# Patient Record
Sex: Male | Born: 1945
Health system: Southern US, Community
[De-identification: ages and names within clinical notes are randomized; demographics above are authoritative.]

## PROBLEM LIST (undated history)

## (undated) DIAGNOSIS — I10 Essential (primary) hypertension: Secondary | ICD-10-CM

## (undated) DIAGNOSIS — I739 Peripheral vascular disease, unspecified: Secondary | ICD-10-CM

## (undated) DIAGNOSIS — J449 Chronic obstructive pulmonary disease, unspecified: Secondary | ICD-10-CM

## (undated) DIAGNOSIS — E119 Type 2 diabetes mellitus without complications: Secondary | ICD-10-CM

## (undated) HISTORY — PX: BELOW KNEE LEG AMPUTATION: SUR23

---

## 2016-12-03 DIAGNOSIS — I10 Essential (primary) hypertension: Secondary | ICD-10-CM | POA: Diagnosis not present

## 2016-12-03 DIAGNOSIS — R809 Proteinuria, unspecified: Secondary | ICD-10-CM | POA: Diagnosis not present

## 2016-12-03 DIAGNOSIS — E1129 Type 2 diabetes mellitus with other diabetic kidney complication: Secondary | ICD-10-CM | POA: Diagnosis not present

## 2016-12-03 DIAGNOSIS — M79641 Pain in right hand: Secondary | ICD-10-CM | POA: Diagnosis not present

## 2017-01-28 DIAGNOSIS — Z89512 Acquired absence of left leg below knee: Secondary | ICD-10-CM | POA: Diagnosis not present

## 2017-01-28 DIAGNOSIS — Z86010 Personal history of colonic polyps: Secondary | ICD-10-CM | POA: Diagnosis not present

## 2017-01-28 DIAGNOSIS — E119 Type 2 diabetes mellitus without complications: Secondary | ICD-10-CM | POA: Diagnosis not present

## 2017-01-28 DIAGNOSIS — Z1159 Encounter for screening for other viral diseases: Secondary | ICD-10-CM | POA: Diagnosis not present

## 2017-01-28 DIAGNOSIS — Z0000 Encounter for general adult medical examination without abnormal findings: Secondary | ICD-10-CM | POA: Diagnosis not present

## 2017-01-28 DIAGNOSIS — E1151 Type 2 diabetes mellitus with diabetic peripheral angiopathy without gangrene: Secondary | ICD-10-CM | POA: Diagnosis not present

## 2017-01-28 DIAGNOSIS — I10 Essential (primary) hypertension: Secondary | ICD-10-CM | POA: Diagnosis not present

## 2017-01-28 DIAGNOSIS — E785 Hyperlipidemia, unspecified: Secondary | ICD-10-CM | POA: Diagnosis not present

## 2017-05-01 DIAGNOSIS — I739 Peripheral vascular disease, unspecified: Secondary | ICD-10-CM | POA: Diagnosis not present

## 2017-05-01 DIAGNOSIS — E119 Type 2 diabetes mellitus without complications: Secondary | ICD-10-CM | POA: Diagnosis not present

## 2017-05-01 DIAGNOSIS — R945 Abnormal results of liver function studies: Secondary | ICD-10-CM | POA: Diagnosis not present

## 2017-07-30 DIAGNOSIS — J449 Chronic obstructive pulmonary disease, unspecified: Secondary | ICD-10-CM | POA: Diagnosis not present

## 2017-07-30 DIAGNOSIS — I10 Essential (primary) hypertension: Secondary | ICD-10-CM | POA: Diagnosis not present

## 2017-07-30 DIAGNOSIS — R809 Proteinuria, unspecified: Secondary | ICD-10-CM | POA: Diagnosis not present

## 2017-07-30 DIAGNOSIS — Z23 Encounter for immunization: Secondary | ICD-10-CM | POA: Diagnosis not present

## 2017-07-30 DIAGNOSIS — E1129 Type 2 diabetes mellitus with other diabetic kidney complication: Secondary | ICD-10-CM | POA: Diagnosis not present

## 2017-07-30 DIAGNOSIS — Z89512 Acquired absence of left leg below knee: Secondary | ICD-10-CM | POA: Diagnosis not present

## 2017-07-30 DIAGNOSIS — I739 Peripheral vascular disease, unspecified: Secondary | ICD-10-CM | POA: Diagnosis not present

## 2018-04-24 DIAGNOSIS — E1129 Type 2 diabetes mellitus with other diabetic kidney complication: Secondary | ICD-10-CM | POA: Diagnosis not present

## 2018-04-24 DIAGNOSIS — I739 Peripheral vascular disease, unspecified: Secondary | ICD-10-CM | POA: Diagnosis not present

## 2018-04-24 DIAGNOSIS — J449 Chronic obstructive pulmonary disease, unspecified: Secondary | ICD-10-CM | POA: Diagnosis not present

## 2018-04-24 DIAGNOSIS — I10 Essential (primary) hypertension: Secondary | ICD-10-CM | POA: Diagnosis not present

## 2018-04-24 DIAGNOSIS — Z89512 Acquired absence of left leg below knee: Secondary | ICD-10-CM | POA: Diagnosis not present

## 2018-04-24 DIAGNOSIS — E785 Hyperlipidemia, unspecified: Secondary | ICD-10-CM | POA: Diagnosis not present

## 2018-04-24 DIAGNOSIS — R710 Precipitous drop in hematocrit: Secondary | ICD-10-CM | POA: Diagnosis not present

## 2018-04-24 DIAGNOSIS — R809 Proteinuria, unspecified: Secondary | ICD-10-CM | POA: Diagnosis not present

## 2018-07-15 ENCOUNTER — Encounter (HOSPITAL_COMMUNITY): Payer: Self-pay | Admitting: Emergency Medicine

## 2018-07-15 ENCOUNTER — Other Ambulatory Visit: Payer: Self-pay

## 2018-07-15 ENCOUNTER — Inpatient Hospital Stay (HOSPITAL_COMMUNITY)
Admission: EM | Admit: 2018-07-15 | Discharge: 2018-07-24 | DRG: 252 | Disposition: A | Discharge status: Skilled Nursing Facility | Payer: Medicare Other | Attending: Internal Medicine | Admitting: Internal Medicine

## 2018-07-15 DIAGNOSIS — I70211 Atherosclerosis of native arteries of extremities with intermittent claudication, right leg: Secondary | ICD-10-CM | POA: Diagnosis present

## 2018-07-15 DIAGNOSIS — E1169 Type 2 diabetes mellitus with other specified complication: Secondary | ICD-10-CM

## 2018-07-15 DIAGNOSIS — R3129 Other microscopic hematuria: Secondary | ICD-10-CM | POA: Diagnosis present

## 2018-07-15 DIAGNOSIS — E875 Hyperkalemia: Secondary | ICD-10-CM | POA: Diagnosis present

## 2018-07-15 DIAGNOSIS — I110 Hypertensive heart disease with heart failure: Secondary | ICD-10-CM | POA: Diagnosis present

## 2018-07-15 DIAGNOSIS — E785 Hyperlipidemia, unspecified: Secondary | ICD-10-CM | POA: Diagnosis present

## 2018-07-15 DIAGNOSIS — G9341 Metabolic encephalopathy: Secondary | ICD-10-CM | POA: Diagnosis not present

## 2018-07-15 DIAGNOSIS — I5041 Acute combined systolic (congestive) and diastolic (congestive) heart failure: Secondary | ICD-10-CM

## 2018-07-15 DIAGNOSIS — I5043 Acute on chronic combined systolic (congestive) and diastolic (congestive) heart failure: Secondary | ICD-10-CM | POA: Diagnosis present

## 2018-07-15 DIAGNOSIS — Z23 Encounter for immunization: Secondary | ICD-10-CM | POA: Diagnosis not present

## 2018-07-15 DIAGNOSIS — G9340 Encephalopathy, unspecified: Secondary | ICD-10-CM | POA: Diagnosis present

## 2018-07-15 DIAGNOSIS — R64 Cachexia: Secondary | ICD-10-CM | POA: Diagnosis present

## 2018-07-15 DIAGNOSIS — I5042 Chronic combined systolic (congestive) and diastolic (congestive) heart failure: Secondary | ICD-10-CM

## 2018-07-15 DIAGNOSIS — Z66 Do not resuscitate: Secondary | ICD-10-CM | POA: Diagnosis present

## 2018-07-15 DIAGNOSIS — I10 Essential (primary) hypertension: Secondary | ICD-10-CM | POA: Diagnosis present

## 2018-07-15 DIAGNOSIS — R4182 Altered mental status, unspecified: Secondary | ICD-10-CM

## 2018-07-15 DIAGNOSIS — I272 Pulmonary hypertension, unspecified: Secondary | ICD-10-CM | POA: Diagnosis present

## 2018-07-15 DIAGNOSIS — I6529 Occlusion and stenosis of unspecified carotid artery: Secondary | ICD-10-CM | POA: Diagnosis present

## 2018-07-15 DIAGNOSIS — R531 Weakness: Secondary | ICD-10-CM

## 2018-07-15 DIAGNOSIS — L8990 Pressure ulcer of unspecified site, unspecified stage: Secondary | ICD-10-CM

## 2018-07-15 DIAGNOSIS — L89152 Pressure ulcer of sacral region, stage 2: Secondary | ICD-10-CM | POA: Diagnosis present

## 2018-07-15 DIAGNOSIS — I4819 Other persistent atrial fibrillation: Secondary | ICD-10-CM | POA: Diagnosis not present

## 2018-07-15 DIAGNOSIS — I739 Peripheral vascular disease, unspecified: Secondary | ICD-10-CM | POA: Diagnosis present

## 2018-07-15 DIAGNOSIS — Z89512 Acquired absence of left leg below knee: Secondary | ICD-10-CM

## 2018-07-15 DIAGNOSIS — I2510 Atherosclerotic heart disease of native coronary artery without angina pectoris: Secondary | ICD-10-CM | POA: Diagnosis present

## 2018-07-15 DIAGNOSIS — Z87891 Personal history of nicotine dependence: Secondary | ICD-10-CM

## 2018-07-15 DIAGNOSIS — I998 Other disorder of circulatory system: Secondary | ICD-10-CM | POA: Diagnosis present

## 2018-07-15 DIAGNOSIS — Z7984 Long term (current) use of oral hypoglycemic drugs: Secondary | ICD-10-CM

## 2018-07-15 DIAGNOSIS — E1151 Type 2 diabetes mellitus with diabetic peripheral angiopathy without gangrene: Principal | ICD-10-CM | POA: Diagnosis present

## 2018-07-15 DIAGNOSIS — Z6820 Body mass index (BMI) 20.0-20.9, adult: Secondary | ICD-10-CM

## 2018-07-15 DIAGNOSIS — I255 Ischemic cardiomyopathy: Secondary | ICD-10-CM | POA: Diagnosis present

## 2018-07-15 DIAGNOSIS — D649 Anemia, unspecified: Secondary | ICD-10-CM | POA: Diagnosis present

## 2018-07-15 DIAGNOSIS — I4891 Unspecified atrial fibrillation: Secondary | ICD-10-CM

## 2018-07-15 DIAGNOSIS — J449 Chronic obstructive pulmonary disease, unspecified: Secondary | ICD-10-CM | POA: Diagnosis present

## 2018-07-15 DIAGNOSIS — I071 Rheumatic tricuspid insufficiency: Secondary | ICD-10-CM | POA: Diagnosis present

## 2018-07-15 DIAGNOSIS — E440 Moderate protein-calorie malnutrition: Secondary | ICD-10-CM | POA: Diagnosis not present

## 2018-07-15 DIAGNOSIS — Z7951 Long term (current) use of inhaled steroids: Secondary | ICD-10-CM

## 2018-07-15 DIAGNOSIS — Z833 Family history of diabetes mellitus: Secondary | ICD-10-CM

## 2018-07-15 HISTORY — DX: Essential (primary) hypertension: I10

## 2018-07-15 HISTORY — DX: Type 2 diabetes mellitus without complications: E11.9

## 2018-07-15 HISTORY — DX: Chronic obstructive pulmonary disease, unspecified: J44.9

## 2018-07-15 HISTORY — DX: Peripheral vascular disease, unspecified: I73.9

## 2018-07-15 LAB — CBC WITH DIFFERENTIAL/PLATELET
ABS IMMATURE GRANULOCYTES: 0.03 10*3/uL (ref 0.00–0.07)
BASOS ABS: 0 10*3/uL (ref 0.0–0.1)
BASOS PCT: 0 %
Eosinophils Absolute: 0 10*3/uL (ref 0.0–0.5)
Eosinophils Relative: 0 %
HCT: 38.1 % — ABNORMAL LOW (ref 39.0–52.0)
HEMOGLOBIN: 11.3 g/dL — AB (ref 13.0–17.0)
IMMATURE GRANULOCYTES: 0 %
LYMPHS PCT: 6 %
Lymphs Abs: 0.4 10*3/uL — ABNORMAL LOW (ref 0.7–4.0)
MCH: 26.8 pg (ref 26.0–34.0)
MCHC: 29.7 g/dL — ABNORMAL LOW (ref 30.0–36.0)
MCV: 90.3 fL (ref 80.0–100.0)
Monocytes Absolute: 0.5 10*3/uL (ref 0.1–1.0)
Monocytes Relative: 8 %
NEUTROS ABS: 6 10*3/uL (ref 1.7–7.7)
NEUTROS PCT: 86 %
NRBC: 0 % (ref 0.0–0.2)
PLATELETS: 123 10*3/uL — AB (ref 150–400)
RBC: 4.22 MIL/uL (ref 4.22–5.81)
RDW: 17.3 % — ABNORMAL HIGH (ref 11.5–15.5)
WBC: 7 10*3/uL (ref 4.0–10.5)

## 2018-07-15 LAB — COMPREHENSIVE METABOLIC PANEL
ALBUMIN: 3.2 g/dL — AB (ref 3.5–5.0)
ALK PHOS: 93 U/L (ref 38–126)
ALT: 19 U/L (ref 0–44)
AST: 38 U/L (ref 15–41)
Anion gap: 11 (ref 5–15)
BUN: 30 mg/dL — ABNORMAL HIGH (ref 8–23)
CALCIUM: 8.7 mg/dL — AB (ref 8.9–10.3)
CHLORIDE: 100 mmol/L (ref 98–111)
CO2: 21 mmol/L — AB (ref 22–32)
CREATININE: 1.35 mg/dL — AB (ref 0.61–1.24)
GFR calc non Af Amer: 50 mL/min — ABNORMAL LOW (ref >60)
GFR, EST AFRICAN AMERICAN: 58 mL/min — AB (ref >60)
GLUCOSE: 297 mg/dL — AB (ref 70–99)
Potassium: 4.8 mmol/L (ref 3.5–5.1)
SODIUM: 132 mmol/L — AB (ref 135–145)
Total Bilirubin: 1.6 mg/dL — ABNORMAL HIGH (ref 0.3–1.2)
Total Protein: 6.7 g/dL (ref 6.5–8.1)

## 2018-07-15 NOTE — ED Triage Notes (Addendum)
Patient reports generalized weakness /fatigue onset this week , denies SOB , no pain or discomfort . Alert and oriented to place and person . Denies fever or chills . Hypertensive at triage.

## 2018-07-16 ENCOUNTER — Encounter (HOSPITAL_COMMUNITY): Payer: Self-pay | Admitting: Internal Medicine

## 2018-07-16 ENCOUNTER — Inpatient Hospital Stay (HOSPITAL_COMMUNITY): Payer: Medicare Other

## 2018-07-16 ENCOUNTER — Other Ambulatory Visit: Payer: Self-pay

## 2018-07-16 ENCOUNTER — Emergency Department (HOSPITAL_COMMUNITY): Payer: Medicare Other

## 2018-07-16 DIAGNOSIS — D649 Anemia, unspecified: Secondary | ICD-10-CM | POA: Diagnosis present

## 2018-07-16 DIAGNOSIS — R3129 Other microscopic hematuria: Secondary | ICD-10-CM | POA: Diagnosis present

## 2018-07-16 DIAGNOSIS — I10 Essential (primary) hypertension: Secondary | ICD-10-CM | POA: Diagnosis present

## 2018-07-16 DIAGNOSIS — I272 Pulmonary hypertension, unspecified: Secondary | ICD-10-CM | POA: Diagnosis present

## 2018-07-16 DIAGNOSIS — I4819 Other persistent atrial fibrillation: Secondary | ICD-10-CM | POA: Diagnosis present

## 2018-07-16 DIAGNOSIS — L89152 Pressure ulcer of sacral region, stage 2: Secondary | ICD-10-CM | POA: Diagnosis present

## 2018-07-16 DIAGNOSIS — L03115 Cellulitis of right lower limb: Secondary | ICD-10-CM | POA: Diagnosis not present

## 2018-07-16 DIAGNOSIS — I5020 Unspecified systolic (congestive) heart failure: Secondary | ICD-10-CM | POA: Diagnosis not present

## 2018-07-16 DIAGNOSIS — E1151 Type 2 diabetes mellitus with diabetic peripheral angiopathy without gangrene: Secondary | ICD-10-CM | POA: Diagnosis present

## 2018-07-16 DIAGNOSIS — I5040 Unspecified combined systolic (congestive) and diastolic (congestive) heart failure: Secondary | ICD-10-CM | POA: Diagnosis not present

## 2018-07-16 DIAGNOSIS — I340 Nonrheumatic mitral (valve) insufficiency: Secondary | ICD-10-CM | POA: Diagnosis not present

## 2018-07-16 DIAGNOSIS — E119 Type 2 diabetes mellitus without complications: Secondary | ICD-10-CM | POA: Diagnosis not present

## 2018-07-16 DIAGNOSIS — I4891 Unspecified atrial fibrillation: Secondary | ICD-10-CM | POA: Diagnosis not present

## 2018-07-16 DIAGNOSIS — Z7401 Bed confinement status: Secondary | ICD-10-CM | POA: Diagnosis not present

## 2018-07-16 DIAGNOSIS — J449 Chronic obstructive pulmonary disease, unspecified: Secondary | ICD-10-CM | POA: Diagnosis present

## 2018-07-16 DIAGNOSIS — I6529 Occlusion and stenosis of unspecified carotid artery: Secondary | ICD-10-CM | POA: Diagnosis present

## 2018-07-16 DIAGNOSIS — E785 Hyperlipidemia, unspecified: Secondary | ICD-10-CM | POA: Diagnosis present

## 2018-07-16 DIAGNOSIS — Z23 Encounter for immunization: Secondary | ICD-10-CM | POA: Diagnosis not present

## 2018-07-16 DIAGNOSIS — J42 Unspecified chronic bronchitis: Secondary | ICD-10-CM | POA: Diagnosis not present

## 2018-07-16 DIAGNOSIS — M2550 Pain in unspecified joint: Secondary | ICD-10-CM | POA: Diagnosis not present

## 2018-07-16 DIAGNOSIS — E1169 Type 2 diabetes mellitus with other specified complication: Secondary | ICD-10-CM

## 2018-07-16 DIAGNOSIS — I998 Other disorder of circulatory system: Secondary | ICD-10-CM | POA: Insufficient documentation

## 2018-07-16 DIAGNOSIS — E440 Moderate protein-calorie malnutrition: Secondary | ICD-10-CM | POA: Diagnosis present

## 2018-07-16 DIAGNOSIS — J418 Mixed simple and mucopurulent chronic bronchitis: Secondary | ICD-10-CM | POA: Diagnosis not present

## 2018-07-16 DIAGNOSIS — I5043 Acute on chronic combined systolic (congestive) and diastolic (congestive) heart failure: Secondary | ICD-10-CM | POA: Diagnosis present

## 2018-07-16 DIAGNOSIS — Z89512 Acquired absence of left leg below knee: Secondary | ICD-10-CM | POA: Diagnosis not present

## 2018-07-16 DIAGNOSIS — I071 Rheumatic tricuspid insufficiency: Secondary | ICD-10-CM | POA: Diagnosis present

## 2018-07-16 DIAGNOSIS — I361 Nonrheumatic tricuspid (valve) insufficiency: Secondary | ICD-10-CM | POA: Diagnosis not present

## 2018-07-16 DIAGNOSIS — I739 Peripheral vascular disease, unspecified: Secondary | ICD-10-CM | POA: Diagnosis not present

## 2018-07-16 DIAGNOSIS — G9340 Encephalopathy, unspecified: Secondary | ICD-10-CM | POA: Diagnosis not present

## 2018-07-16 DIAGNOSIS — I480 Paroxysmal atrial fibrillation: Secondary | ICD-10-CM | POA: Diagnosis not present

## 2018-07-16 DIAGNOSIS — R4182 Altered mental status, unspecified: Secondary | ICD-10-CM | POA: Diagnosis not present

## 2018-07-16 DIAGNOSIS — R531 Weakness: Secondary | ICD-10-CM | POA: Diagnosis not present

## 2018-07-16 DIAGNOSIS — I5041 Acute combined systolic (congestive) and diastolic (congestive) heart failure: Secondary | ICD-10-CM | POA: Diagnosis not present

## 2018-07-16 DIAGNOSIS — I255 Ischemic cardiomyopathy: Secondary | ICD-10-CM | POA: Diagnosis present

## 2018-07-16 DIAGNOSIS — R64 Cachexia: Secondary | ICD-10-CM | POA: Diagnosis present

## 2018-07-16 DIAGNOSIS — I2510 Atherosclerotic heart disease of native coronary artery without angina pectoris: Secondary | ICD-10-CM | POA: Diagnosis present

## 2018-07-16 DIAGNOSIS — E875 Hyperkalemia: Secondary | ICD-10-CM | POA: Diagnosis present

## 2018-07-16 DIAGNOSIS — Z66 Do not resuscitate: Secondary | ICD-10-CM | POA: Diagnosis present

## 2018-07-16 DIAGNOSIS — I70211 Atherosclerosis of native arteries of extremities with intermittent claudication, right leg: Secondary | ICD-10-CM | POA: Diagnosis present

## 2018-07-16 DIAGNOSIS — G9341 Metabolic encephalopathy: Secondary | ICD-10-CM | POA: Diagnosis present

## 2018-07-16 DIAGNOSIS — I110 Hypertensive heart disease with heart failure: Secondary | ICD-10-CM | POA: Diagnosis present

## 2018-07-16 DIAGNOSIS — Z6820 Body mass index (BMI) 20.0-20.9, adult: Secondary | ICD-10-CM | POA: Diagnosis not present

## 2018-07-16 LAB — RAPID URINE DRUG SCREEN, HOSP PERFORMED
Amphetamines: NOT DETECTED
Barbiturates: NOT DETECTED
Benzodiazepines: NOT DETECTED
COCAINE: NOT DETECTED
Opiates: NOT DETECTED
Tetrahydrocannabinol: NOT DETECTED

## 2018-07-16 LAB — URINALYSIS, ROUTINE W REFLEX MICROSCOPIC
Bilirubin Urine: NEGATIVE
Glucose, UA: >=500 mg/dL — AB
KETONES UR: 5 mg/dL — AB
Leukocytes, UA: NEGATIVE
NITRITE: NEGATIVE
PH: 5 (ref 5.0–8.0)
Protein, ur: >=300 mg/dL — AB
Specific Gravity, Urine: 1.023 (ref 1.005–1.030)

## 2018-07-16 LAB — GLUCOSE, CAPILLARY
GLUCOSE-CAPILLARY: 81 mg/dL (ref 70–99)
Glucose-Capillary: 135 mg/dL — ABNORMAL HIGH (ref 70–99)

## 2018-07-16 LAB — CBG MONITORING, ED
GLUCOSE-CAPILLARY: 234 mg/dL — AB (ref 70–99)
Glucose-Capillary: 230 mg/dL — ABNORMAL HIGH (ref 70–99)

## 2018-07-16 LAB — CK: CK TOTAL: 276 U/L (ref 49–397)

## 2018-07-16 LAB — ACETAMINOPHEN LEVEL: Acetaminophen (Tylenol), Serum: <10 ug/mL — ABNORMAL LOW (ref 10–30)

## 2018-07-16 LAB — HEMOGLOBIN A1C
HEMOGLOBIN A1C: 7.9 % — AB (ref 4.8–5.6)
MEAN PLASMA GLUCOSE: 180.03 mg/dL

## 2018-07-16 LAB — SALICYLATE LEVEL: Salicylate Lvl: <7 mg/dL (ref 2.8–30.0)

## 2018-07-16 LAB — SEDIMENTATION RATE: Sed Rate: 13 mm/hr (ref 0–16)

## 2018-07-16 LAB — HEPARIN LEVEL (UNFRACTIONATED): Heparin Unfractionated: 0.14 IU/mL — ABNORMAL LOW (ref 0.30–0.70)

## 2018-07-16 LAB — PREALBUMIN: PREALBUMIN: 9.2 mg/dL — AB (ref 18–38)

## 2018-07-16 LAB — C-REACTIVE PROTEIN: CRP: 10.6 mg/dL — AB (ref <1.0)

## 2018-07-16 MED ORDER — ONDANSETRON HCL 4 MG PO TABS: 4.0000 mg | ORAL_TABLET | Freq: Four times a day (QID) | ORAL | Status: DC | PRN

## 2018-07-16 MED ORDER — INSULIN ASPART 100 UNIT/ML ~~LOC~~ SOLN: 0.0000 [IU] | Freq: Every day | SUBCUTANEOUS | Status: DC

## 2018-07-16 MED ORDER — ACETAMINOPHEN 650 MG RE SUPP: 650.0000 mg | Freq: Four times a day (QID) | RECTAL | Status: DC | PRN

## 2018-07-16 MED ORDER — ACETAMINOPHEN 325 MG PO TABS: 650.0000 mg | ORAL_TABLET | Freq: Four times a day (QID) | ORAL | Status: DC | PRN

## 2018-07-16 MED ORDER — HEPARIN SODIUM (PORCINE) 5000 UNIT/ML IJ SOLN: 5000.0000 [IU] | Freq: Three times a day (TID) | INTRAMUSCULAR | Status: DC

## 2018-07-16 MED ORDER — ATORVASTATIN CALCIUM 40 MG PO TABS
40.0000 mg | ORAL_TABLET | Freq: Every day | ORAL | Status: DC
  Administered 2018-07-16 – 2018-07-24 (×8): 40 mg via ORAL
  Filled 2018-07-16 (×9): qty 1

## 2018-07-16 MED ORDER — RAMIPRIL 2.5 MG PO CAPS
2.5000 mg | ORAL_CAPSULE | Freq: Every day | ORAL | Status: DC
  Administered 2018-07-16 – 2018-07-19 (×4): 2.5 mg via ORAL
  Filled 2018-07-16 (×5): qty 1

## 2018-07-16 MED ORDER — DOCUSATE SODIUM 100 MG PO CAPS
100.0000 mg | ORAL_CAPSULE | Freq: Two times a day (BID) | ORAL | Status: DC
  Administered 2018-07-16 – 2018-07-21 (×6): 100 mg via ORAL
  Filled 2018-07-16 (×12): qty 1

## 2018-07-16 MED ORDER — SODIUM CHLORIDE 0.9% FLUSH
3.0000 mL | Freq: Two times a day (BID) | INTRAVENOUS | Status: DC
  Administered 2018-07-16 – 2018-07-23 (×4): 3 mL via INTRAVENOUS

## 2018-07-16 MED ORDER — HEPARIN (PORCINE) IN NACL 100-0.45 UNIT/ML-% IJ SOLN
1100.0000 [IU]/h | INTRAMUSCULAR | Status: DC
  Administered 2018-07-16: 1100 [IU]/h via INTRAVENOUS
  Administered 2018-07-16: 900 [IU]/h via INTRAVENOUS
  Administered 2018-07-18: 1100 [IU]/h via INTRAVENOUS
  Filled 2018-07-16 (×4): qty 250

## 2018-07-16 MED ORDER — MOMETASONE FURO-FORMOTEROL FUM 200-5 MCG/ACT IN AERO
2.0000 | INHALATION_SPRAY | Freq: Two times a day (BID) | RESPIRATORY_TRACT | Status: DC
  Administered 2018-07-16 – 2018-07-24 (×15): 2 via RESPIRATORY_TRACT
  Filled 2018-07-16 (×2): qty 8.8

## 2018-07-16 MED ORDER — ALBUTEROL SULFATE (2.5 MG/3ML) 0.083% IN NEBU: 2.5000 mg | INHALATION_SOLUTION | RESPIRATORY_TRACT | Status: DC | PRN

## 2018-07-16 MED ORDER — METOPROLOL TARTRATE 50 MG PO TABS
50.0000 mg | ORAL_TABLET | Freq: Two times a day (BID) | ORAL | Status: DC
  Administered 2018-07-16 – 2018-07-20 (×9): 50 mg via ORAL
  Filled 2018-07-16 (×12): qty 1

## 2018-07-16 MED ORDER — SODIUM CHLORIDE 0.9 % IV SOLN
INTRAVENOUS | Status: DC
  Administered 2018-07-16 (×2): via INTRAVENOUS

## 2018-07-16 MED ORDER — PNEUMOCOCCAL VAC POLYVALENT 25 MCG/0.5ML IJ INJ
0.5000 mL | INJECTION | INTRAMUSCULAR | Status: AC
  Administered 2018-07-17: 0.5 mL via INTRAMUSCULAR
  Filled 2018-07-16: qty 0.5

## 2018-07-16 MED ORDER — HEPARIN BOLUS VIA INFUSION
3500.0000 [IU] | Freq: Once | INTRAVENOUS | Status: AC
  Administered 2018-07-16: 3500 [IU] via INTRAVENOUS
  Filled 2018-07-16: qty 3500

## 2018-07-16 MED ORDER — INSULIN ASPART 100 UNIT/ML ~~LOC~~ SOLN: 0.0000 [IU] | Freq: Three times a day (TID) | SUBCUTANEOUS | Status: DC

## 2018-07-16 MED ORDER — MORPHINE SULFATE (PF) 2 MG/ML IV SOLN: 2.0000 mg | INTRAVENOUS | Status: DC | PRN

## 2018-07-16 MED ORDER — HEPARIN BOLUS VIA INFUSION
2000.0000 [IU] | Freq: Once | INTRAVENOUS | Status: AC
  Administered 2018-07-16: 2000 [IU] via INTRAVENOUS
  Filled 2018-07-16: qty 2000

## 2018-07-16 MED ORDER — INSULIN ASPART 100 UNIT/ML ~~LOC~~ SOLN
0.0000 [IU] | Freq: Three times a day (TID) | SUBCUTANEOUS | Status: DC
  Administered 2018-07-16 – 2018-07-17 (×2): 5 [IU] via SUBCUTANEOUS
  Administered 2018-07-18 – 2018-07-19 (×2): 2 [IU] via SUBCUTANEOUS
  Administered 2018-07-19: 5 [IU] via SUBCUTANEOUS
  Administered 2018-07-19 – 2018-07-20 (×2): 8 [IU] via SUBCUTANEOUS
  Administered 2018-07-20: 3 [IU] via SUBCUTANEOUS
  Administered 2018-07-21: 5 [IU] via SUBCUTANEOUS
  Administered 2018-07-21: 8 [IU] via SUBCUTANEOUS
  Administered 2018-07-22: 3 [IU] via SUBCUTANEOUS
  Administered 2018-07-22: 8 [IU] via SUBCUTANEOUS
  Administered 2018-07-22 – 2018-07-23 (×2): 3 [IU] via SUBCUTANEOUS
  Administered 2018-07-23 (×2): 5 [IU] via SUBCUTANEOUS
  Administered 2018-07-24 (×2): 3 [IU] via SUBCUTANEOUS

## 2018-07-16 MED ORDER — INFLUENZA VAC SPLIT HIGH-DOSE 0.5 ML IM SUSY
0.5000 mL | PREFILLED_SYRINGE | INTRAMUSCULAR | Status: AC
  Administered 2018-07-17: 0.5 mL via INTRAMUSCULAR
  Filled 2018-07-16: qty 0.5

## 2018-07-16 MED ORDER — INSULIN ASPART 100 UNIT/ML ~~LOC~~ SOLN
0.0000 [IU] | Freq: Every day | SUBCUTANEOUS | Status: DC
  Administered 2018-07-17: 2 [IU] via SUBCUTANEOUS
  Administered 2018-07-21: 3 [IU] via SUBCUTANEOUS

## 2018-07-16 MED ORDER — ONDANSETRON HCL 4 MG/2ML IJ SOLN: 4.0000 mg | Freq: Four times a day (QID) | INTRAMUSCULAR | Status: DC | PRN

## 2018-07-16 NOTE — Progress Notes (Signed)
Preliminary results by tech - ABI completed. Right sided ABI 0.42. Left - BKA.  Oda Cogan, BS, RDMS, RVT

## 2018-07-16 NOTE — H&P (Signed)
History and Physical    Derek Hinton WUX:[id number] DOB: 07/22/1946 DOA: 07/15/2018  PCP: Sheral Apley, MD Consultants:  None Patient coming from:  Home - lives alone; NOK: Son, [phone]; Daughter, [phone]  Chief Complaint: AMS  HPI: [name] is a 72 y.o. male with medical history significant of HTN, DM presenting with AMS.  "I got so I couldn't do."  He couldn't get up, was in the floor.  Patient's brother came by yesterday afternoon and found the patient in the floor.  Possibly 36-48 hours down.  He is unable to bear weight on his right foot.  He saw his PCP a month or more ago and was told that the blood flow to the foot was good.  "The swelling's what got me.  See, I had sugar and didn't know I had it."  His right foot has been bothering him for maybe a year or more, pain.  He goes dancing Saturday nights and is able to dance slow dances.  A usual day for him: rides to his brother's house, gets mad, goes home and lays around the house.  He eats "very few" meals that he makes.  Eats a lot of sandwiches, eggs on Sunday mornings.  No fevers.  He is using an inhaler.  Quit smoking 6 months ago.  L BKA 7 years ago, and was supposed to quit then.   ED Course:  Carryover, per Dr. Tamala Julian:  [name] is 72 year old male with pmh of HTN and DM type II; who presents with generalized weakness and altered mental status. Patient had been found on the floor of his home but denies having any falls. CT imaging of the brain showed periventricular white matter and corona radiata hypodensities favoring chronic ischemic changes and a small remote left thalamic lacunar infarct. Labs revealed WBC 7, hemoglobin 11.3, platelets 123, sodium 132, BUN 30, creatinine 1.35(baseline 0.9), glucose 297, anion gap 11. Urinalysis revealed hematuria. Accepted to a telemetry bed. Checking acetaminophen level, salicylate level, UDS. Placed on normal saline IV fluids at 100 mL/h.  Review of Systems: As per HPI;  otherwise review of systems reviewed and negative.   Ambulatory Status:  Ambulates without assistance when he wears his porosthetic  Past Medical History:  Diagnosis Date  . COPD (chronic obstructive pulmonary disease) (HCC)    not on home O2  . Diabetes mellitus without complication ([place])   . Hypertension   . PVD (peripheral vascular disease) ([place])    s/p L BKA    Past Surgical History:  Procedure Laterality Date  . BELOW KNEE LEG AMPUTATION      Social History   Socioeconomic History  . Marital status: Divorced    Spouse name: Not on file  . Number of children: Not on file  . Years of education: Not on file  . Highest education level: Not on file  Occupational History  . Occupation: retired  Scientific laboratory technician  . Financial resource strain: Not on file  . Food insecurity:    Worry: Not on file    Inability: Not on file  . Transportation needs:    Medical: Not on file    Non-medical: Not on file  Tobacco Use  . Smoking status: Former Smoker    Packs/day: 1.00    Years: 60.00    Pack years: 60.00    Last attempt to quit: 12/2017    Years since quitting: 0.6  . Smokeless tobacco: Never Used  Substance and Sexual Activity  . Alcohol use: Not  Currently  . Drug use: Never  . Sexual activity: Not on file  Lifestyle  . Physical activity:    Days per week: Not on file    Minutes per session: Not on file  . Stress: Not on file  Relationships  . Social connections:    Talks on phone: Not on file    Gets together: Not on file    Attends religious service: Not on file    Active member of club or organization: Not on file    Attends meetings of clubs or organizations: Not on file    Relationship status: Not on file  . Intimate partner violence:    Fear of current or ex partner: Not on file    Emotionally abused: Not on file    Physically abused: Not on file    Forced sexual activity: Not on file  Other Topics Concern  . Not on file  Social History Narrative  . Not  on file    Allergies  Allergen Reactions  . Penicillins Other (See Comments)    Pt unsure, states he "didn't feel good" Patient states it made him "feel Funny"     Family History  Problem Relation Age of Onset  . Diabetes Father     Prior to Admission medications   Medication Sig Start Date End Date Taking? Authorizing Provider  atorvastatin (LIPITOR) 40 MG tablet Take 20 mg by mouth daily. 01/29/17  Yes [provider]  budesonide-formoterol (SYMBICORT) 160-4.5 MCG/ACT inhaler Inhale 2 puffs into the lungs 2 (two) times daily. 06/12/18  Yes [provider]  glipiZIDE (GLUCOTROL XL) 5 MG 24 hr tablet Take 5 mg by mouth daily. 06/06/18  Yes [provider]  metFORMIN (GLUCOPHAGE) 1000 MG tablet Take 1,000 mg by mouth 2 (two) times daily. 12/18/17  Yes [provider]  metoprolol tartrate (LOPRESSOR) 50 MG tablet Take 50 mg by mouth 2 (two) times daily. 04/28/18  Yes [provider]  ramipril (ALTACE) 2.5 MG capsule Take 2.5 mg by mouth daily. 04/28/18  Yes [provider]  traMADol (ULTRAM) 50 MG tablet Take 50 mg by mouth every 6 (six) hours as needed for moderate pain.  04/28/18  Yes [provider]    Physical Exam: Vitals:   07/16/18 0900 07/16/18 0915 07/16/18 0930 07/16/18 0945  BP: 134/79 (!) 152/105 (!) 169/123 (!) 149/94  Pulse: 87 94 94   Resp: 16 15 16 17   Temp:      TempSrc:      SpO2: 100% 100% 100%      General:  Appears calm and comfortable and is NAD; he does appear chronically ill and cachectic Eyes:  PERRL, R exotropia, normal lids, iris ENT:  grossly normal hearing, lips & tongue, mmm; poor dentition Neck:  no LAD, masses or thyromegaly Cardiovascular:  Irregularly irregular rate/rhythm, no m/r/g. Respiratory:   CTA bilaterally with no wheezes/rales/rhonchi.  Normal respiratory effort. Abdomen:  soft, NT, ND, NABS Skin: mottling of the RLE with erythema of the toes Musculoskeletal: s/p L BKA; RLE  with mottling and toe erythema with decreased capillary refill and tenderness to superficial palpation of the toes without obvious ulceration.  There is marked foot edema and this may be causing artificially diminished pulses by palpation and by Doppler. Psychiatric: blunted mood and affect, speech fluent and appropriate, AOx3 Neurologic:  CN 2-12 grossly intact, moves all extremities in coordinated fashion    Radiological Exams on Admission: Ct Head Wo Contrast  Result Date: 07/16/2018  CLINICAL DATA:  Altered mental status.  Hypertension and diabetes. EXAM: CT HEAD WITHOUT CONTRAST TECHNIQUE: Contiguous axial images were obtained from the base of the skull through the vertex without intravenous contrast. COMPARISON:  None. FINDINGS: Brain: Small remote lacunar infarct of the left thalamus, image 19/3. Periventricular white matter and corona radiata hypodensities favor chronic ischemic microvascular white matter disease. Otherwise, the brainstem, cerebellum, cerebral peduncles, thalami, basal ganglia, basilar cisterns, and ventricular system appear within normal limits. No intracranial hemorrhage, mass lesion, or acute CVA. Vascular: Atherosclerotic calcification of the vertebral arteries (especially the left) with punctate atherosclerotic calcification in the basilar artery. There is atherosclerotic calcification of the cavernous carotid arteries bilaterally. Skull: Unremarkable Sinuses/Orbits: Unremarkable Other: No supplemental non-categorized findings. IMPRESSION: 1. No acute intracranial findings. 2. Periventricular white matter and corona radiata hypodensities favor chronic ischemic microvascular white matter disease. 3. Small remote left thalamic lacunar infarct. 4. Atherosclerosis. Electronically Signed   By: Van Clines M.D.   On: 07/16/2018 03:01    EKG: Independently reviewed.  Afib with rate 106; nonspecific ST changes with no evidence of acute ischemia   Labs on Admission: I have  personally reviewed the available labs and imaging studies at the time of the admission.  Pertinent labs:   CO2 21 Glucose 297 BUN 30/Creatinine 1.35/GFR 50 Albumin 3.2 CK 276 WBC 7.0 Hgb 11.3 Tylenol, ASA negative UA: >500 glu, large Hgb, 5 ketones, >300 protein, rare bacteria UDS negative  Assessment/Plan Principal Problem:   Acute encephalopathy Active Problems:   PVD (peripheral vascular disease) (HCC)   COPD (chronic obstructive pulmonary disease) (HCC)   Essential hypertension   Type 2 diabetes mellitus with other specified complication (HCC)   Unspecified atrial fibrillation (HCC)   Acute encephalopathy -Patient presenting after he was found in the floor at home, possibly for a prolonged period of time -Normal CK -Son reports ongoing intermittent confusion (patient was generally appropriate but then thought it was Monday, for example) -Son denies dementia, but there may be a component of underlying mild cognitive impairment at play Additionally, there were vascular concerns (see below) -Will admit for additional evaluation and treatment -Given his uncertain baseline capacity as well as current ischemic limb concerns, inpatient admission is reasonable at this time  PVD -Patient with pain and mottling of the foot concerning for ischemia -Dopplers intermittent during his stay in the ER both by nursing staff and on my exam -Concern for limb ischemia -ASAP vascular surgery consult requested; I spoke with Dr. Donnetta Hutching, whose assistance is appreciated -He is started on heparin drip for now (for afib and also this issue) -He does have marked pedal edema and so this concern for limb ischemia may be exacerbated by this issue -Will increase Lipitor from 20 to 40 mg daily for now -PT consult tomorrow  Afib -Patient with apparent new-onset afib - he denies h/o in the past -I do not appreciate prior EKGs in either our records or in Care Everywhere -He appears to be rate  controlled at this time on Lopressor -Will start heparin per pharmacy for now -Of note, he does have microscopic hematuria and this will need to be monitored (he likely needs outpatient urology consultation for this issue)  COPD -He was recently started on Symbicort for this issue -He is not on home O2 -He is no longer smoking -Will add prn albuterol although he denies current symptoms  HTN -Continue Lopressor and Altace  DM -Will check A1c; it was 7.6 in 7/19 -hold Glucophage, Glucotrol -Cover with  moderate-scale SSI   DVT prophylaxis:Heparin drip  Code Status:  DNR - confirmed with patient/family Family Communication: Son present throughout evaluation  Disposition Plan:  To be determined Consults called: Vascular surgery; CM, DM, nutrition, PT Admission status: Admit - It is my clinical opinion that admission to INPATIENT is reasonable and necessary because of the expectation that this patient will require hospital care that crosses at least 2 midnights to treat this condition based on the medical complexity of the problems presented.  Given the aforementioned information, the predictability of an adverse outcome is felt to be significant.    Karmen Bongo MD [facility]  If note is complete, please contact covering daytime or nighttime physician. [email or web address] Password [email or web address]  07/16/2018, 10:50 AM

## 2018-07-16 NOTE — Progress Notes (Signed)
Inpatient Diabetes Program Recommendations  AACE/ADA: New Consensus Statement on Inpatient Glycemic Control (2015)  Target Ranges:  Prepandial:   less than 140 mg/dL      Peak postprandial:   less than 180 mg/dL (1-2 hours)      Critically ill patients:  140 - 180 mg/dL   Lab Results  Component Value Date   GLUCAP 135 (H) 07/16/2018   HGBA1C 7.9 (H) 07/16/2018    Review of Glycemic Control  Diabetes history: DM2 Outpatient Diabetes medications: metformin 1000 mg bid, glipizide 5 mg QAM Current orders for Inpatient glycemic control: Novolog 0-15 units tidwc and hs  HgbA1C - 7.9%. Creat1.35 Needs tighter glycemic control for healing.  Inpatient Diabetes Program Recommendations:     Add Levemir 10 units QHS.  Will follow closely.  Thank you. Lorenda Peck, RD, LDN, CDE Inpatient Diabetes Coordinator [phone]

## 2018-07-16 NOTE — Progress Notes (Signed)
ANTICOAGULATION CONSULT NOTE - Follow Up Consult  Pharmacy Consult for Heparin Indication: atrial fibrillation and possible ischemic leg  Allergies  Allergen Reactions  . Penicillins Other (See Comments)    Pt unsure, states he "didn't feel good" Patient states it made him "feel Funny"     Patient Measurements: Height: 5\' 9"  (175.3 cm) Weight: 138 lb (62.6 kg) IBW/kg (Calculated) : 70.7 Heparin Dosing Weight: 62.6 kg  Vital Signs: Temp: 98.1 F (36.7 C) (10/09 2157) BP: 142/89 (10/09 2157) Pulse Rate: 86 (10/09 2157)  Labs: Recent Labs    07/15/18 2235 07/16/18 2023  HGB 11.3*  --   HCT 38.1*  --   PLT 123*  --   HEPARINUNFRC  --  0.14*  CREATININE 1.35*  --   CKTOTAL 276  --     Estimated Creatinine Clearance: 42.5 mL/min (A) (by C-G formula based on SCr of 1.35 mg/dL (H)).  Assessment:  43 yom presented to the ED with AMS. Found to be in new onset afib and possible with an ischemic leg. IV heparin begun. Baseline Hgb and platelets are slightly low. Of note, pt with microscopic hematuria which will need to be monitored. Pt is not on anticoagulation PTA.     Initial heparin level is subtherapeutic (0.14) on 900 units/hr.   No infusion problems reported.  No hematuria reported tonight.  Goal of Therapy:  Heparin level 0.3-0.7 units/ml Monitor platelets by anticoagulation protocol: Yes   Plan:   Heparin 2000 units IV x 1  Increase heparin drip to 1100 units/hr  Next heparin level and CBC in am.  Monitor platelets and hematuria and s/sx bleeding.  Arty Baumgartner, [place] Pager: [phone] 07/16/2018,10:09 PM

## 2018-07-16 NOTE — ED Notes (Signed)
Pt notified that urine is needed. Urinal placed at bedside.

## 2018-07-16 NOTE — Progress Notes (Signed)
Preliminary results by tech - Right lower arterial duplex completed. The CFA is patent with monophasic flow. SFA demonstrates a high grade stenosis, 70-99% in the proximal to mid segment. The popliteal and calf vessels are patent with monophasic flow. Right sided ABI -0.42. Oda Cogan, BS, RDMS, RVT

## 2018-07-16 NOTE — ED Provider Notes (Signed)
TIME SEEN: 12:51 AM  CHIEF COMPLAINT: Generalized weakness, confusion  HPI: Patient is a 72 year old male with history of hypertension, diabetes, left BKA who presents to the emergency department with generalized weakness and confusion.  Patient is a very poor historian.  His son reports that he has been on the floor for the past 24 hours.  Patient denies that he fell.  States he sat himself down on the floor but then could not get up.  He lives at home alone.  He denies fevers, chest pain, shortness of breath, vomiting, diarrhea, numbness, tingling or focal weakness.  He reports he has been nauseated.  His son reports that he has been referring to his son as his 74 year old brother.  He appears to be confused which is some that is new for him.  ROS: Level 5 caveat secondary to confusion  PAST MEDICAL HISTORY/PAST SURGICAL HISTORY:  Past Medical History:  Diagnosis Date  . Diabetes mellitus without complication ([place])   . Hypertension     MEDICATIONS:  Prior to Admission medications   Not on File    ALLERGIES:  No Known Allergies  SOCIAL HISTORY:  Social History   Tobacco Use  . Smoking status: Former Research scientist (life sciences)  . Smokeless tobacco: Never Used  Substance Use Topics  . Alcohol use: Not Currently    FAMILY HISTORY: No family history on file.  EXAM: BP (!) 162/104   Pulse (!) 103   Temp 98.7 F (37.1 C) (Oral)   Resp 20   SpO2 100%  CONSTITUTIONAL: Alert and oriented to person and place but not time and responds appropriately to questions intermittently but appears confused with some questioning.  Elderly, in no distress HEAD: Normocephalic, atraumatic EYES: Conjunctivae clear, pupils appear equal, EOMI ENT: normal nose; moist mucous membranes NECK: Supple, no meningismus, no nuchal rigidity, no LAD  CARD: Irregularly irregular and minimally tachycardic; S1 and S2 appreciated; no murmurs, no clicks, no rubs, no gallops RESP: Normal chest excursion without splinting or  tachypnea; breath sounds clear and equal bilaterally; no wheezes, no rhonchi, no rales, no hypoxia or respiratory distress, speaking full sentences ABD/GI: Normal bowel sounds; non-distended; soft, non-tender, no rebound, no guarding, no peritoneal signs, no hepatosplenomegaly BACK:  The back appears normal and is non-tender to palpation, there is no CVA tenderness EXT: Patient is status post left BKA.  His right foot is extremely swollen and red but not warm.  I am unable to palpate pulses but he has easily dopplerable biphasic DP and PT pulses of the right foot.  There are no open wounds noted. SKIN: Normal color for age and race; warm; no rash NEURO: Moves all extremities equally, strength 5/5 in all 4 extremities, reports normal sensation diffusely, cranial nerves II through XII intact, normal speech PSYCH: The patient's mood and manner are appropriate. Grooming and personal hygiene are appropriate.  MEDICAL DECISION MAKING: Patient here with generalized weakness and confusion.  Denies any infectious symptoms, chest pain or shortness of breath.  He is in A. fib at this time.  He denies history of the same.  It does not appear he is on anticoagulation.  He has no focal neurologic deficits today other than being disoriented to time.  His son reports that he has been calling his son the wrong name all day today which is abnormal for him.  Will check labs, urine, head CT, CK level.  He does not appear dehydrated on exam.  ED PROGRESS: Patient's labs, urine, head CT showed no acute  abnormality.  Given acute change in mental status, will admit.   4:00 AM Discussed patient's case with hospitalist, Dr. Tamala Julian.  I have recommended admission and patient (and family if present) agree with this plan. Admitting physician will place admission orders.   I reviewed all nursing notes, vitals, pertinent previous records, EKGs, lab and urine results, imaging (as available).       EKG  Interpretation  Date/Time:  Tuesday July 15 2018 22:22:36 EDT Ventricular Rate:  106 PR Interval:    QRS Duration: 92 QT Interval:  336 QTC Calculation: 446 R Axis:   65 Text Interpretation:  Atrial fibrillation with rapid ventricular response Anterior infarct , age undetermined Abnormal ECG No old tracing to compare Confirmed by Ward, Cyril Mourning [phone]) on 07/16/2018 12:51:12 AM         Ward, Delice Bison, DO 07/16/18 0400

## 2018-07-16 NOTE — ED Notes (Signed)
Pt given cup of water. Handling PO fluids well.

## 2018-07-16 NOTE — Plan of Care (Signed)
Received patient from Dr. Cyril Mourning Ward. Derek Hinton is 72 year old male with pmh of HTN and DM type II; who presents with generalized weakness and altered mental status.  Patient had been found on the floor of his home but denies having any falls.  CT imaging of the brain showed periventricular white matter and corona radiata hypodensities favoring chronic ischemic changes and a small remote left thalamic lacunar infarct.  Labs revealed WBC 7, hemoglobin 11.3, platelets 123, sodium 132, BUN 30, creatinine 1.35(baseline 0.9), glucose 297, anion gap 11.  Urinalysis revealed hematuria.  Accepted to a telemetry bed.  Checking acetaminophen level, salicylate level, UDS.  Placed on normal saline IV fluids at 100 mL/h.

## 2018-07-16 NOTE — Progress Notes (Signed)
ANTICOAGULATION CONSULT NOTE - Initial Consult  Pharmacy Consult for heparin Indication: atrial fibrillation and possible ischemic leg  Allergies  Allergen Reactions  . Penicillins Other (See Comments)    Pt unsure, states he "didn't feel good" Patient states it made him "feel Funny"     Patient Measurements: Height: 5\' 9"  (175.3 cm) Weight: 138 lb (62.6 kg) IBW/kg (Calculated) : 70.7 Heparin Dosing Weight: 62.6kg  Vital Signs: BP: 149/94 (10/09 0945) Pulse Rate: 94 (10/09 0930)  Labs: Recent Labs    07/15/18 2235  HGB 11.3*  HCT 38.1*  PLT 123*  CREATININE 1.35*  CKTOTAL 276    Estimated Creatinine Clearance: 42.5 mL/min (A) (by C-G formula based on SCr of 1.35 mg/dL (H)).   Medical History: Past Medical History:  Diagnosis Date  . COPD (chronic obstructive pulmonary disease) (HCC)    not on home O2  . Diabetes mellitus without complication ([place])   . Hypertension   . PVD (peripheral vascular disease) ([place])    s/p L BKA    Medications:  Infusions:  . sodium chloride 100 mL/hr at 07/16/18 0700  . heparin      Assessment: 43 yom presented to the ED with AMS. Found to be in new onset afib and possible with an ischemic leg. To start IV heparin. Baseline Hgb and platelets are slightly low. Of note, pt with microscopic hematuria which will need to be monitored. Pt is not on anticoagulation PTA.   Goal of Therapy:  Heparin level 0.3-0.7 units/ml Monitor platelets by anticoagulation protocol: Yes   Plan:  Heparin bolus 3500 units IV x 1 Heparin gtt 900 units/hr Check an 8 hr heparin level Daily heparin level and CBC F/u vascular workup Monitor plts and bleeding/hematuria  Christeena Krogh, Rande Lawman 07/16/2018,11:12 AM

## 2018-07-16 NOTE — Consult Note (Addendum)
Hospital Consult    Reason for Consult:  Possible ischemic leg-right Requesting Physician:  ED MRN #:  [id number]  History of Present Illness: This is a 72 y.o. male who is a poor historian, who was brought to the hospital last evening with weakness and confusion.  He states that he went to see Dr. Tilden Dome about 3-4 weeks ago (he is unsure of exact timing) and he was told that his foot had pretty good blood flow.  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  He has good days and bad days with his prosthesis.  He states that his diabetes made his foot turn black and he had to have it amputated.  He states that his right foot is a little sore.  He states that he gets a little cramping in his calf and has gotten worse over the past couple of weeks but says it could've been longer than that but he is not sure.  Pt confused about his son but realizes it is his son after saying it was his father.  He states he has not had any fevers at home but has had some chills.  He says sometimes his diabetes is good and sometimes bad.  He denies feeling like heartbeat or feeling palpitations.    He is on oral agents for his diabetes.  The pt is on a statin for cholesterol management.  I went through his medications with his son and he is not on any blood thinners.  He is on a beta blocker and ACEI for blood pressure management.   His urine reveals hematuria.  Urine drug screen is negative.  He quit smoking around Thanksgiving last year.   His father had hx of diabetes.  No FH of AAA in his family per his son.    Past Medical History:  Diagnosis Date  . COPD (chronic obstructive pulmonary disease) ([facility]CC)    not on home O2  . Diabetes mellitus without complication ([place])   . Hypertension   . PVD (peripheral vascular disease) ([place])    s/p L BKA    Past Surgical History:  Procedure Laterality Date  . BELOW KNEE LEG AMPUTATION      Allergies  Allergen Reactions  . Penicillins  Other (See Comments)    Pt unsure, states he "didn't feel good" Patient states it made him "feel Funny"     Prior to Admission medications   Medication Sig Start Date End Date Taking? Authorizing Provider  atorvastatin (LIPITOR) 40 MG tablet Take 20 mg by mouth daily. 01/29/17  Yes [provider]  budesonide-formoterol (SYMBICORT) 160-4.5 MCG/ACT inhaler Inhale 2 puffs into the lungs 2 (two) times daily. 06/12/18  Yes [provider]  glipiZIDE (GLUCOTROL XL) 5 MG 24 hr tablet Take 5 mg by mouth daily. 06/06/18  Yes [provider]  metFORMIN (GLUCOPHAGE) 1000 MG tablet Take 1,000 mg by mouth 2 (two) times daily. 12/18/17  Yes [provider]  metoprolol tartrate (LOPRESSOR) 50 MG tablet Take 50 mg by mouth 2 (two) times daily. 04/28/18  Yes [provider]  ramipril (ALTACE) 2.5 MG capsule Take 2.5 mg by mouth daily. 04/28/18  Yes [provider]  traMADol (ULTRAM) 50 MG tablet Take 50 mg by mouth every 6 (six) hours as needed for moderate pain.  04/28/18  Yes [provider]    Social History   Socioeconomic History  . Marital status: Divorced  Spouse name: Not on file  . Number of children: Not on file  . Years of education: Not on file  . Highest education level: Not on file  Occupational History  . Occupation: retired  Scientific laboratory technician  . Financial resource strain: Not on file  . Food insecurity:    Worry: Not on file    Inability: Not on file  . Transportation needs:    Medical: Not on file    Non-medical: Not on file  Tobacco Use  . Smoking status: Former Smoker    Packs/day: 1.00    Years: 60.00    Pack years: 60.00    Last attempt to quit: 12/2017    Years since quitting: 0.6  . Smokeless tobacco: Never Used  Substance and Sexual Activity  . Alcohol use: Not Currently  . Drug use: Never  . Sexual activity: Not on file  Lifestyle  . Physical activity:    Days per week: Not on file    Minutes per session:  Not on file  . Stress: Not on file  Relationships  . Social connections:    Talks on phone: Not on file    Gets together: Not on file    Attends religious service: Not on file    Active member of club or organization: Not on file    Attends meetings of clubs or organizations: Not on file    Relationship status: Not on file  . Intimate partner violence:    Fear of current or ex partner: Not on file    Emotionally abused: Not on file    Physically abused: Not on file    Forced sexual activity: Not on file  Other Topics Concern  . Not on file  Social History Narrative  . Not on file    Family History  Problem Relation Age of Onset  . Diabetes Father      ROS: [x]  Positive   [ ]  Negative   [ ]  All sytems reviewed and are negative  Cardiac: []  chest pain/pressure []  palpitations []  SOB lying flat []  DOE [x]  irregular heart beat  Vascular: [x]  pain in right leg while walking []  pain in legs at rest []  pain in legs at night []  non-healing ulcers []  hx of DVT []  swelling in legs  Pulmonary: []  productive cough []  asthma/wheezing []  home O2  Neurologic: [x]  weakness - generalized []  numbness in []  arms []  legs []  hx of CVA []  mini stroke [] difficulty speaking or slurred speech []  temporary loss of vision in one eye []  dizziness  Hematologic: []  hx of cancer []  bleeding problems []  problems with blood clotting easily  Endocrine:   [x]  diabetes []  thyroid disease  GI []  vomiting blood []  blood in stool  GU: []  CKD/renal failure []  HD--[]  M/W/F or []  T/T/S []  burning with urination [x]  blood in urine  Psychiatric: []  anxiety []  depression  Musculoskeletal: []  arthritis []  joint pain  Integumentary: [x]  rash right foot []  ulcers  Constitutional: []  fever [x]  chills   Physical Examination  Vitals:   07/16/18 0630 07/16/18 0700  BP: (!) 141/87 (!) 173/113  Pulse: 85 89  Resp: 14 15  Temp:    SpO2: 98% (!) 85%   There is no height or  weight on file to calculate BMI.  General:  WDWN in NAD Gait: Not observed HENT: WNL, normocephalic Pulmonary: normal non-labored breathing, without Rales, rhonchi,  wheezing Cardiac: irregular; without carotid bruits Abdomen:  soft, NT/ND, no masses Skin:  without rashes Vascular Exam/Pulses:  Right Left  Radial 2+ (normal) 2+ (normal)  Femoral 2+ (normal) 2+ (normal)  DP Brisk doppler signal BKA  PT Brisk doppler signal BKA  Peroneal Brisk doppler signal BKA   Extremities:      Musculoskeletal: no muscle wasting or atrophy  Neurologic: A&O X 3;  No focal weakness or paresthesias are detected; speech is fluent/normal Psychiatric:  The pt has Normal affect.   CBC    Component Value Date/Time   WBC 7.0 07/15/2018 2235   RBC 4.22 07/15/2018 2235   HGB 11.3 (L) 07/15/2018 2235   HCT 38.1 (L) 07/15/2018 2235   PLT 123 (L) 07/15/2018 2235   MCV 90.3 07/15/2018 2235   MCH 26.8 07/15/2018 2235   MCHC 29.7 (L) 07/15/2018 2235   RDW 17.3 (H) 07/15/2018 2235   LYMPHSABS 0.4 (L) 07/15/2018 2235   MONOABS 0.5 07/15/2018 2235   EOSABS 0.0 07/15/2018 2235   BASOSABS 0.0 07/15/2018 2235    BMET    Component Value Date/Time   NA 132 (L) 07/15/2018 2235   K 4.8 07/15/2018 2235   CL 100 07/15/2018 2235   CO2 21 (L) 07/15/2018 2235   GLUCOSE 297 (H) 07/15/2018 2235   BUN 30 (H) 07/15/2018 2235   CREATININE 1.35 (H) 07/15/2018 2235   CALCIUM 8.7 (L) 07/15/2018 2235   GFRNONAA 50 (L) 07/15/2018 2235   GFRAA 58 (L) 07/15/2018 2235    COAGS: none  Non-Invasive Vascular Imaging:   None  CT head 07/16/18: IMPRESSION: 1. No acute intracranial findings. 2. Periventricular white matter and corona radiata hypodensities favor chronic ischemic microvascular white matter disease. 3. Small remote left thalamic lacunar infarct. 4. Atherosclerosis.   Statin:  Yes.   Beta Blocker:  Yes.   Aspirin:  No. ACEI:  Yes.   ARB:  No. CCB use:  No Other antiplatelets/anticoagulants:   No.    ASSESSMENT/PLAN: This is a 72 y.o. male with hx of diabetes with left BKA about 7 years ago who presents to ER with new onset of confusion/weakness & found to be in afib   -right leg with brisk doppler signals at the AT/PT/peroneal.   He underwent a left BKA ~ 7 years ago for diabetic foot infection per pt.  -he is in a fib and most likely will need to be started on heparin, however, he did have hematuria on u/a.  His UDS was negative.   -Dr. Donnetta Hutching will be by to see the pt this morning.   Leontine Locket, PA-C [facility] [phone]   I have examined the patient, reviewed and agree with above.  Patient found down for an undetermined period of time and brought to the emergency department.  Remains somewhat confused although talkative.  He does have excoriation on the skin on the dorsum part of his foot.  This does not appear to be ischemic to me.  His foot is warm and he has normal motor and sensory function.  Question if this is related to primary skin condition or fungal infection.  The patient reports that he saw his medical doctor about this several months ago but on sure as to the accuracy of this.  He does have history of below-knee amputation on the left side but is walking with a prosthesis.  He has a significantly diminished right femoral pulse and noninvasive studies today show a monophasic waveform at the common femoral area suggesting proximal stenosis.  We will follow along.  May need eventual  imaging studies but do not feel that he has any acute limb threatening ischemia currently.  Of note, he does have ulcerative skin lesion on the anterior surface of his right thigh.  Would consider dermatology evaluation for this and for his foot.  Curt Jews, MD 07/16/2018 3:26 PM

## 2018-07-16 NOTE — ED Notes (Signed)
Patient transported to Vascular 

## 2018-07-16 NOTE — Progress Notes (Signed)
CSW acknowledges consult for accessing medications for discharge. For further assistance with this matter please consult RNCM. At this time there are no further CSW needs warranted. CSW will sign off. Please reconsult if new need arises.    Derek Hinton, MSW, [facility] Clinical Social Worker [phone]

## 2018-07-17 ENCOUNTER — Inpatient Hospital Stay (HOSPITAL_COMMUNITY): Payer: Medicare Other

## 2018-07-17 DIAGNOSIS — I361 Nonrheumatic tricuspid (valve) insufficiency: Secondary | ICD-10-CM

## 2018-07-17 DIAGNOSIS — I340 Nonrheumatic mitral (valve) insufficiency: Secondary | ICD-10-CM

## 2018-07-17 DIAGNOSIS — J42 Unspecified chronic bronchitis: Secondary | ICD-10-CM

## 2018-07-17 LAB — BASIC METABOLIC PANEL
ANION GAP: 7 (ref 5–15)
BUN: 31 mg/dL — ABNORMAL HIGH (ref 8–23)
CO2: 23 mmol/L (ref 22–32)
Calcium: 8 mg/dL — ABNORMAL LOW (ref 8.9–10.3)
Chloride: 105 mmol/L (ref 98–111)
Creatinine, Ser: 1.11 mg/dL (ref 0.61–1.24)
GLUCOSE: 69 mg/dL — AB (ref 70–99)
POTASSIUM: 4.4 mmol/L (ref 3.5–5.1)
Sodium: 135 mmol/L (ref 135–145)

## 2018-07-17 LAB — HEPARIN LEVEL (UNFRACTIONATED)
HEPARIN UNFRACTIONATED: 0.38 [IU]/mL (ref 0.30–0.70)
Heparin Unfractionated: 0.19 IU/mL — ABNORMAL LOW (ref 0.30–0.70)

## 2018-07-17 LAB — ECHOCARDIOGRAM COMPLETE
Height: 69 in
Weight: 2208 oz

## 2018-07-17 LAB — CBC
HCT: 35 % — ABNORMAL LOW (ref 39.0–52.0)
Hemoglobin: 10.7 g/dL — ABNORMAL LOW (ref 13.0–17.0)
MCH: 27.5 pg (ref 26.0–34.0)
MCHC: 30.6 g/dL (ref 30.0–36.0)
MCV: 90 fL (ref 80.0–100.0)
NRBC: 0 % (ref 0.0–0.2)
PLATELETS: 98 10*3/uL — AB (ref 150–400)
RBC: 3.89 MIL/uL — AB (ref 4.22–5.81)
RDW: 17 % — ABNORMAL HIGH (ref 11.5–15.5)
WBC: 4.5 10*3/uL (ref 4.0–10.5)

## 2018-07-17 LAB — GLUCOSE, CAPILLARY
GLUCOSE-CAPILLARY: 94 mg/dL (ref 70–99)
Glucose-Capillary: 106 mg/dL — ABNORMAL HIGH (ref 70–99)
Glucose-Capillary: 140 mg/dL — ABNORMAL HIGH (ref 70–99)
Glucose-Capillary: 201 mg/dL — ABNORMAL HIGH (ref 70–99)
Glucose-Capillary: 238 mg/dL — ABNORMAL HIGH (ref 70–99)

## 2018-07-17 LAB — HIV ANTIBODY (ROUTINE TESTING W REFLEX): HIV SCREEN 4TH GENERATION: NONREACTIVE

## 2018-07-17 MED ORDER — GLUCERNA SHAKE PO LIQD
237.0000 mL | Freq: Two times a day (BID) | ORAL | Status: DC
  Administered 2018-07-17 – 2018-07-22 (×7): 237 mL via ORAL

## 2018-07-17 MED ORDER — ADULT MULTIVITAMIN W/MINERALS CH
1.0000 | ORAL_TABLET | Freq: Every day | ORAL | Status: DC
  Administered 2018-07-17 – 2018-07-24 (×7): 1 via ORAL
  Filled 2018-07-17 (×8): qty 1

## 2018-07-17 NOTE — H&P (View-Only) (Signed)
Patient ID: Derek Hinton, male   DOB: [birth year], 72 y.o.   MRN: [id number] Patient is comfortable today.  His daughter is here and helps provide information as well.  Reports that the patient has been active and actually dances every Saturday night with his BK prosthesis.  He is alert and oriented today.  He does report swelling causing discomfort in his right leg.  No clear-cut rest pain.  He does have this excoriated rash over his foot but does not appear to be profound ischemia.  He has markedly diminished flow to his right foot but I think this is chronic.  His noninvasive studies yesterday suggested iliac occlusive disease in addition to superficial femoral artery occlusive disease.  Had long discussion with the patient and the daughter.  Recommended arteriography for further evaluation to determine if endovascular treatment is possible for proximal stenosis.  Certainly would defer any aggressive major surgical revascularization.

## 2018-07-17 NOTE — Progress Notes (Signed)
Echocardiogram 2D Echocardiogram has been performed.  Derek Hinton 07/17/2018, 3:21 PM

## 2018-07-17 NOTE — Progress Notes (Signed)
Patient ID: Derek Hinton, male   DOB: October 23, 1945, 72 y.o.   MRN: [id number] Patient is comfortable today.  His daughter is here and helps provide information as well.  Reports that the patient has been active and actually dances every Saturday night with his BK prosthesis.  He is alert and oriented today.  He does report swelling causing discomfort in his right leg.  No clear-cut rest pain.  He does have this excoriated rash over his foot but does not appear to be profound ischemia.  He has markedly diminished flow to his right foot but I think this is chronic.  His noninvasive studies yesterday suggested iliac occlusive disease in addition to superficial femoral artery occlusive disease.  Had long discussion with the patient and the daughter.  Recommended arteriography for further evaluation to determine if endovascular treatment is possible for proximal stenosis.  Certainly would defer any aggressive major surgical revascularization.

## 2018-07-17 NOTE — Progress Notes (Signed)
ANTICOAGULATION CONSULT NOTE - Follow Up Consult  Pharmacy Consult for Heparin  Indication: atrial fibrillation and possible ischemic leg  Allergies  Allergen Reactions  . Penicillins Other (See Comments)    Pt unsure, states he "didn't feel good" Patient states it made him "feel Funny"     Patient Measurements: Height: 5\' 9"  (175.3 cm) Weight: 138 lb (62.6 kg) IBW/kg (Calculated) : 70.7 Heparin Dosing Weight: 62.6 kg  Vital Signs: Temp: 98.1 F (36.7 C) (10/09 2157) BP: 142/89 (10/09 2157) Pulse Rate: 86 (10/09 2157)  Labs: Recent Labs    07/15/18 2235 07/16/18 2023 07/17/18 0335  HGB 11.3*  --   --   HCT 38.1*  --   --   PLT 123*  --   --   HEPARINUNFRC  --  0.14* 0.19*  CREATININE 1.35*  --   --   CKTOTAL 276  --   --     Estimated Creatinine Clearance: 42.5 mL/min (A) (by C-G formula based on SCr of 1.35 mg/dL (H)).  Assessment:  58 yom presented to the ED with AMS. Found to be in new onset afib and possible with an ischemic leg. IV heparin begun. Baseline Hgb and platelets are slightly low. Of note, pt with microscopic hematuria which will need to be monitored. Pt is not on anticoagulation PTA.   10/10 AM update: heparin level is low at 0.19, however, heparin was off for about 30 minutes this AM just prior to lab draw due to loss of access. Will not change rate for now  Goal of Therapy:  Heparin level 0.3-0.7 units/ml Monitor platelets by anticoagulation protocol: Yes   Plan:   No rate change due to loss of access this AM before lab draw  Cont heparin at 1100 units/hr  Re-check heparin level at [place], PharmD, [place] Pharmacist Phone: [phone]

## 2018-07-17 NOTE — Evaluation (Signed)
Physical Therapy Evaluation Patient Details Name: Derek Hinton MRN: [id number] DOB: 1946-04-20 Today's Date: 07/17/2018   History of Present Illness  Pt is a 72 y/o male admitted secondary to being found down at home for an unknown time and with acute encephalopathy. Pt also found to be in a-fib. PMH including but not limited to L transtibial amputation, PVD, HTN, DM and COPD.    Clinical Impression  Pt presented supine in bed with HOB elevated, awake and willing to participate in therapy session. Pt's daughter present throughout. Prior to admission, pt reported that he was independent with all functional mobility and ADLs (with his L LE prosthesis). Pt lives alone and enjoys dancing. He currently requires min A-min A x2 for overall functional mobility with use of RW. He is limited secondary to R foot pain and weakness. Pt would continue to benefit from skilled physical therapy services at this time while admitted and after d/c to address the below listed limitations in order to improve overall safety and independence with functional mobility.     Follow Up Recommendations SNF;Supervision/Assistance - 24 hour    Equipment Recommendations  None recommended by PT    Recommendations for Other Services       Precautions / Restrictions Precautions Precautions: Fall Precaution Comments: prior L transtibial amputation with prosthesis in room Restrictions Weight Bearing Restrictions: No      Mobility  Bed Mobility Overal bed mobility: Needs Assistance Bed Mobility: Supine to Sit     Supine to sit: Min assist     General bed mobility comments: increased time and effort, pt insisting therapist help with trunk elevation  Transfers Overall transfer level: Needs assistance Equipment used: Rolling walker (2 wheeled) Transfers: Sit to/from [facility] Sit to Stand: Min assist;+2 safety/equipment;+2 physical assistance Stand pivot transfers: Min assist;+2  safety/equipment       General transfer comment: increased time and effort, bed in elevated position, assist for stability with transitional movements  Ambulation/Gait             General Gait Details: deferred secondary to increased pain in R foot  Stairs            Wheelchair Mobility    Modified Rankin (Stroke Patients Only)       Balance Overall balance assessment: Needs assistance Sitting-balance support: No upper extremity supported Sitting balance-Leahy Scale: Good     Standing balance support: Bilateral upper extremity supported Standing balance-Leahy Scale: Poor                               Pertinent Vitals/Pain Pain Assessment: Faces Faces Pain Scale: Hurts little more Pain Location: R foot Pain Descriptors / Indicators: Sore Pain Intervention(s): Monitored during session;Repositioned    Home Living Family/patient expects to be discharged to:: Private residence Living Arrangements: Alone Available Help at Discharge: Family;Available PRN/intermittently Type of Home: House Home Access: Stairs to enter Entrance Stairs-Rails: None Entrance Stairs-Number of Steps: 1 Home Layout: One level Home Equipment: Walker - 2 wheels;Shower seat      Prior Function Level of Independence: Independent with assistive device(s)         Comments: ambulates without an AD with L LE prosthesis     Hand Dominance        Extremity/Trunk Assessment   Upper Extremity Assessment Upper Extremity Assessment: Overall WFL for tasks assessed    Lower Extremity Assessment Lower Extremity Assessment: Generalized weakness  Communication   Communication: No difficulties  Cognition Arousal/Alertness: Awake/alert Behavior During Therapy: WFL for tasks assessed/performed Overall Cognitive Status: Within Functional Limits for tasks assessed                                        General Comments      Exercises      Assessment/Plan    PT Assessment Patient needs continued PT services  PT Problem List Decreased activity tolerance;Decreased balance;Decreased mobility;Decreased coordination;Decreased safety awareness;Decreased knowledge of precautions       PT Treatment Interventions Gait training;DME instruction;Stair training;Functional mobility training;Therapeutic activities;Balance training;Therapeutic exercise;Neuromuscular re-education;Patient/family education    PT Goals (Current goals can be found in the Care Plan section)  Acute Rehab PT Goals Patient Stated Goal: decrease pain PT Goal Formulation: With patient Time For Goal Achievement: 07/31/18 Potential to Achieve Goals: Good    Frequency Min 3X/week   Barriers to discharge        Co-evaluation               AM-PAC PT "6 Clicks" Daily Activity  Outcome Measure Difficulty turning over in bed (including adjusting bedclothes, sheets and blankets)?: A Little Difficulty moving from lying on back to sitting on the side of the bed? : Unable Difficulty sitting down on and standing up from a chair with arms (e.g., wheelchair, bedside commode, etc,.)?: Unable Help needed moving to and from a bed to chair (including a wheelchair)?: A Little Help needed walking in hospital room?: A Little Help needed climbing 3-5 steps with a railing? : Total 6 Click Score: 12    End of Session Equipment Utilized During Treatment: Gait belt Activity Tolerance: Patient limited by pain Patient left: in chair;with call bell/phone within reach;with family/visitor present Nurse Communication: Mobility status PT Visit Diagnosis: Other abnormalities of gait and mobility (R26.89)    Time: 4825-0037 PT Time Calculation (min) (ACUTE ONLY): 21 min   Charges:   PT Evaluation $PT Eval Moderate Complexity: [place], PT, DPT  Acute Rehabilitation Services Pager [phone] Office [place] 07/17/2018, 11:56 AM

## 2018-07-17 NOTE — Progress Notes (Signed)
ANTICOAGULATION CONSULT NOTE - Follow Up Consult  Pharmacy Consult for Heparin  Indication: atrial fibrillation and possible ischemic leg  Allergies  Allergen Reactions  . Penicillins Other (See Comments)    Pt unsure, states he "didn't feel good" Patient states it made him "feel Funny"     Patient Measurements: Height: 5\' 9"  (175.3 cm) Weight: 138 lb (62.6 kg) IBW/kg (Calculated) : 70.7 Heparin Dosing Weight: 62.6 kg  Vital Signs: Temp: 98.2 F (36.8 C) (10/10 1307) Temp Source: Oral (10/10 1307) BP: 128/89 (10/10 1307) Pulse Rate: 60 (10/10 1307)  Labs: Recent Labs    07/15/18 2235 07/16/18 2023 07/17/18 0335 07/17/18 1136  HGB 11.3*  --  10.7*  --   HCT 38.1*  --  35.0*  --   PLT 123*  --  98*  --   HEPARINUNFRC  --  0.14* 0.19* 0.38  CREATININE 1.35*  --  1.11  --   CKTOTAL 276  --   --   --     Estimated Creatinine Clearance: 51.7 mL/min (by C-G formula based on SCr of 1.11 mg/dL).  Assessment:  67 yom presented to the ED with AMS. Found to be in new onset afib and possible with an ischemic leg. IV heparin begun. Baseline Hgb and platelets are slightly low. Of note, pt with microscopic hematuria which will need to be monitored. Pt is not on anticoagulation PTA.   Heparin level now therapeutic  Goal of Therapy:  Heparin level 0.3-0.7 units/ml Monitor platelets by anticoagulation protocol: Yes   Plan:  Continue heparin at 1100 units / hr Follow up AM labs  Thank you Anette Guarneri, PharmD [phone]

## 2018-07-17 NOTE — Progress Notes (Signed)
PROGRESS NOTE        PATIENT DETAILS Name: Derek Hinton Age: 72 y.o. Sex: male Date of Birth: [birth year] Admit Date: 07/15/2018 Admitting Physician Karmen Bongo, MD EKC:[id number], Jenny Reichmann, MD  Brief Narrative: Patient is a 72 y.o. male with prior history of left BKA, DM-2, hypertension presented with confusion-he was apparently found down on the floor (was on the floor for almost 2 days)-he was also found to have new onset atrial fibrillation, and worsening leg ischemia.  He was subsequently admitted for further evaluation and treatment.  Subjective: Completely awake and alert.  No chest pain or shortness of breath.  Does acknowledge claudication pain since July.  Assessment/Plan: Acute metabolic encephalopathy: Suspect secondary dehydration from being on the floor for almost 2 days.  Has resolved.    New onset atrial fibrillation: Rate controlled-continue IV heparin.  Rate controlled with metoprolol.  Continue telemetry monitoring.  Awaiting echocardiogram.  DM-2: CBG stable continue with SSI  Hypertension: BP controlled-continue metoprolol and ramipril.  Peripheral vascular disease-with probable critical limb ischemia: No evidence of acute ischemic laying at this time per vascular surgery-continue with IV heparin. Continue statin-not on aspirin as patient is on anticoagulation. Await further input from vascular surgery.  COPD: Stable-continue with bronchodilators.  Left BKA  Other issues: Lives alone-is status post left BKA-uses a prosthesis-not very mobile.  Needs PT/OT evaluation.  Awaiting further input from vascular surgery as well.  Telemetry (independently reviewed): Atrial fibrillation.  DVT Prophylaxis: Full dose anticoagulation with Heparin  Code Status: Full code   Family Communication: Daughter at bedside  Disposition Plan: Remain inpatient-but will plan on Home health vs SNF on discharge  Antimicrobial agents: Anti-infectives  (From admission, onward)   None     Procedures: None  CONSULTS:  vascular surgery  Time spent: 25 minutes-Greater than 50% of this time was spent in counseling, explanation of diagnosis, planning of further management, and coordination of care.  MEDICATIONS: Scheduled Meds: . atorvastatin  40 mg Oral Daily  . docusate sodium  100 mg Oral BID  . Influenza vac split quadrivalent PF  0.5 mL Intramuscular Tomorrow-1000  . insulin aspart  0-15 Units Subcutaneous TID WC  . insulin aspart  0-5 Units Subcutaneous QHS  . metoprolol tartrate  50 mg Oral BID  . mometasone-formoterol  2 puff Inhalation BID  . pneumococcal 23 valent vaccine  0.5 mL Intramuscular Tomorrow-1000  . ramipril  2.5 mg Oral Daily  . sodium chloride flush  3 mL Intravenous Q12H   Continuous Infusions: . sodium chloride 100 mL/hr at 07/16/18 1931  . heparin 1,100 Units/hr (07/16/18 2239)   PRN Meds:.acetaminophen **OR** acetaminophen, albuterol, morphine injection, ondansetron **OR** ondansetron (ZOFRAN) IV   PHYSICAL EXAM: Vital signs: Vitals:   07/16/18 1927 07/16/18 2157 07/17/18 0535 07/17/18 0816  BP:  (!) 142/89 136/72   Pulse:  86 77   Resp:  18 18   Temp:  98.1 F (36.7 C) 97.8 F (36.6 C)   TempSrc:      SpO2: 97% 99% 98% 98%  Weight:      Height:       Filed Weights   07/16/18 1100  Weight: 62.6 kg   Body mass index is 20.38 kg/m.   General appearance :Awake, alert, not in any distress. Speech Clear. Not toxic Looking Eyes:, pupils equally reactive to light and accomodation,no scleral icterus.Pink conjunctiva  HEENT: Atraumatic and Normocephalic Neck: supple, no JVD. No cervical lymphadenopathy. No thyromegaly Resp:Good air entry bilaterally, no added sounds  CVS: S1 S2 irregular GI: Bowel sounds present, Non tender and not distended with no gaurding, rigidity or rebound.No organomegaly Extremities: Left BKA, right foot appears warm-appears to have edema in the right lower extremity.  Appears to have some erythema and mottling in the dorsum of his right foot. Neurology:  speech clear,Non focal, sensation is grossly intact. Musculoskeletal:No digital cyanosis Skin:No Rash, warm and dry Wounds:N/A  I have personally reviewed following labs and imaging studies  LABORATORY DATA: CBC: Recent Labs  Lab 07/15/18 2235 07/17/18 0335  WBC 7.0 4.5  NEUTROABS 6.0  --   HGB 11.3* 10.7*  HCT 38.1* 35.0*  MCV 90.3 90.0  PLT 123* 98*    Basic Metabolic Panel: Recent Labs  Lab 07/15/18 2235 07/17/18 0335  NA 132* 135  K 4.8 4.4  CL 100 105  CO2 21* 23  GLUCOSE 297* 69*  BUN 30* 31*  CREATININE 1.35* 1.11  CALCIUM 8.7* 8.0*    GFR: Estimated Creatinine Clearance: 51.7 mL/min (by C-G formula based on SCr of 1.11 mg/dL).  Liver Function Tests: Recent Labs  Lab 07/15/18 2235  AST 38  ALT 19  ALKPHOS 93  BILITOT 1.6*  PROT 6.7  ALBUMIN 3.2*   No results for input(s): LIPASE, AMYLASE in the last 168 hours. No results for input(s): AMMONIA in the last 168 hours.  Coagulation Profile: No results for input(s): INR, PROTIME in the last 168 hours.  Cardiac Enzymes: Recent Labs  Lab 07/15/18 2235  CKTOTAL 276    BNP (last 3 results) No results for input(s): PROBNP in the last 8760 hours.  HbA1C: Recent Labs    07/16/18 0855  HGBA1C 7.9*    CBG: Recent Labs  Lab 07/16/18 1156 07/16/18 1751 07/16/18 2154 07/17/18 0708 07/17/18 0812  GLUCAP 230* 135* 81 106* 140*    Lipid Profile: No results for input(s): CHOL, HDL, LDLCALC, TRIG, CHOLHDL, LDLDIRECT in the last 72 hours.  Thyroid Function Tests: No results for input(s): TSH, T4TOTAL, FREET4, T3FREE, THYROIDAB in the last 72 hours.  Anemia Panel: No results for input(s): VITAMINB12, FOLATE, FERRITIN, TIBC, IRON, RETICCTPCT in the last 72 hours.  Urine analysis:    Component Value Date/Time   COLORURINE AMBER (A) 07/16/2018 0113   APPEARANCEUR HAZY (A) 07/16/2018 0113   LABSPEC  1.023 07/16/2018 0113   PHURINE 5.0 07/16/2018 0113   GLUCOSEU >=500 (A) 07/16/2018 0113   HGBUR LARGE (A) 07/16/2018 0113   BILIRUBINUR NEGATIVE 07/16/2018 0113   KETONESUR 5 (A) 07/16/2018 0113   PROTEINUR >=300 (A) 07/16/2018 0113   NITRITE NEGATIVE 07/16/2018 0113   LEUKOCYTESUR NEGATIVE 07/16/2018 0113    Sepsis Labs: Lactic Acid, Venous No results found for: LATICACIDVEN  MICROBIOLOGY: No results found for this or any previous visit (from the past 240 hour(s)).  RADIOLOGY STUDIES/RESULTS: Ct Head Wo Contrast  Result Date: 07/16/2018 CLINICAL DATA:  Altered mental status.  Hypertension and diabetes. EXAM: CT HEAD WITHOUT CONTRAST TECHNIQUE: Contiguous axial images were obtained from the base of the skull through the vertex without intravenous contrast. COMPARISON:  None. FINDINGS: Brain: Small remote lacunar infarct of the left thalamus, image 19/3. Periventricular white matter and corona radiata hypodensities favor chronic ischemic microvascular white matter disease. Otherwise, the brainstem, cerebellum, cerebral peduncles, thalami, basal ganglia, basilar cisterns, and ventricular system appear within normal limits. No intracranial hemorrhage, mass lesion, or acute CVA. Vascular: Atherosclerotic  calcification of the vertebral arteries (especially the left) with punctate atherosclerotic calcification in the basilar artery. There is atherosclerotic calcification of the cavernous carotid arteries bilaterally. Skull: Unremarkable Sinuses/Orbits: Unremarkable Other: No supplemental non-categorized findings. IMPRESSION: 1. No acute intracranial findings. 2. Periventricular white matter and corona radiata hypodensities favor chronic ischemic microvascular white matter disease. 3. Small remote left thalamic lacunar infarct. 4. Atherosclerosis. Electronically Signed   By: Van Clines M.D.   On: 07/16/2018 03:01     LOS: 1 day   Oren Binet, MD  [facility]  If 7PM-7AM,  please contact night-coverage  Please page via [email or web address]-click on MD name and type text message  07/17/2018, 11:49 AM

## 2018-07-17 NOTE — Progress Notes (Signed)
Initial Nutrition Assessment  DOCUMENTATION CODES:   Non-severe (moderate) malnutrition in context of chronic illness  INTERVENTION:   - Glucerna Shake po BID, each supplement provides 220 kcal and 10 grams of protein  - Magic cup BID with meals, each supplement provides 290 kcal and 9 grams of protein  - MVI with minerals daily  - Provided education regarding the importance of adequate kcal and protein in wound healing  NUTRITION DIAGNOSIS:   Moderate Malnutrition related to chronic illness (COPD) as evidenced by mild fat depletion, moderate fat depletion, mild muscle depletion, moderate muscle depletion.  GOAL:   Patient will meet greater than or equal to 90% of their needs  MONITOR:   PO intake, Supplement acceptance, Labs, Skin, Weight trends  REASON FOR ASSESSMENT:   Consult Wound healing  ASSESSMENT:   72 year old male who presented to the ED on 10/8 with generalized weakness and confusion. PMH significant for hypertension, type 2 diabetes mellitus, PVD s/p left BKA, and COPD.  Spoke with pt and daughter at bedside. Pt with 100% completed lunch meal tray in room. Pt's daughter expressed concern with meals coming late and being too high in carbohydrates. RD provided education regarding carbohydrate modified diet.  When asked about his appetite, pt states, "sometimes I eat like a horse, and sometimes I eat like a rat." Pt reports that he eats "according to how hungry I am." Per pt's daughter, pt does not eat a lot of vegetables. Pt states that he lives alone and typically eats 2 meals daily. A meal includes pimento cheese sandwich with cucumbers or a banana salad. Pt tells daughter that he is willing to consider a home meal delivery service since he does not cook much.  Pt states that he drinks a lot of water and occasionally has "cherry wine."  Pt shares that he does not follow a diabetic diet but that his blood sugars regularly run between 115-130.  Pt's daughter  reports that pt has been "holding the same weight" for a while but does appear "frail" to her. Pt states that he is at his baseline weight. Pt reports that he weighed 180 lbs over 7 years ago (prior to BKA). Pt reports his most recent weight was 137 lbs in July of this year. No weight history available in chart.  Pt states that he does not usually like oral nutrition supplements but is willing to try one in a chocolate flavor. RD to order Glucerna BID and Magic Cup BID.  Medications reviewed and include: Colace 100 mg BID, sliding scale Novolog  Labs reviewed: BUN 31 (H), hemoglobin 10.7 (L), HCT 35.0 (L) CBG's: 140, 106, 81, 135, 230 x 24 hours   NUTRITION - FOCUSED PHYSICAL EXAM:    Most Recent Value  Orbital Region  Mild depletion  Upper Arm Region  Moderate depletion  Thoracic and Lumbar Region  Moderate depletion  Buccal Region  Mild depletion  Temple Region  Moderate depletion  Clavicle Bone Region  Moderate depletion  Clavicle and Acromion Bone Region  Moderate depletion  Scapular Bone Region  Unable to assess  Dorsal Hand  Mild depletion  Patellar Region  Moderate depletion  Anterior Thigh Region  Moderate depletion  Posterior Calf Region  Moderate depletion  Edema (RD Assessment)  Moderate [RLE]  Hair  Reviewed  Eyes  Reviewed  Mouth  Reviewed  Skin  Reviewed  Nails  Reviewed       Diet Order:   Diet Order  Diet NPO time specified Except for: Sips with Meds  Diet effective midnight        Diet heart healthy/carb modified Room service appropriate? Yes; Fluid consistency: Thin  Diet effective now              EDUCATION NEEDS:   Education needs have been addressed  Skin:  Skin Assessment: Skin Integrity Issues: Stage I: L buttocks Stage II: sacrum  Last BM:  PTA/unknown  Height:   Ht Readings from Last 1 Encounters:  07/16/18 5\' 9"  (1.753 m)    Weight:   Wt Readings from Last 1 Encounters:  07/16/18 62.6 kg    Ideal Body Weight:  68  kg  BMI:  Body mass index is 20.38 kg/m.  Estimated Nutritional Needs:   Kcal:  1600-1800  Protein:  80-95 grams  Fluid:  1.6-1.8 L    Gaynell Face, MS, RD, LDN Inpatient Clinical Dietitian Pager: [phone] Weekend/After Hours: [phone]

## 2018-07-18 ENCOUNTER — Inpatient Hospital Stay (HOSPITAL_COMMUNITY)
Admission: EM | Disposition: A | Discharge status: Skilled Nursing Facility | Payer: Self-pay | Source: Home / Self Care | Attending: Internal Medicine

## 2018-07-18 ENCOUNTER — Encounter (HOSPITAL_COMMUNITY): Payer: Self-pay | Admitting: Vascular Surgery

## 2018-07-18 DIAGNOSIS — I4819 Other persistent atrial fibrillation: Secondary | ICD-10-CM

## 2018-07-18 DIAGNOSIS — E1169 Type 2 diabetes mellitus with other specified complication: Secondary | ICD-10-CM

## 2018-07-18 DIAGNOSIS — I5020 Unspecified systolic (congestive) heart failure: Secondary | ICD-10-CM

## 2018-07-18 DIAGNOSIS — I739 Peripheral vascular disease, unspecified: Secondary | ICD-10-CM

## 2018-07-18 DIAGNOSIS — E440 Moderate protein-calorie malnutrition: Secondary | ICD-10-CM

## 2018-07-18 HISTORY — PX: PERIPHERAL VASCULAR INTERVENTION: CATH118257

## 2018-07-18 HISTORY — PX: ABDOMINAL AORTOGRAM W/LOWER EXTREMITY: CATH118223

## 2018-07-18 LAB — POCT ACTIVATED CLOTTING TIME
ACTIVATED CLOTTING TIME: 257 s
ACTIVATED CLOTTING TIME: 191 s
Activated Clotting Time: 263 seconds
Activated Clotting Time: 208 seconds

## 2018-07-18 LAB — CBC
HCT: 37.5 % — ABNORMAL LOW (ref 39.0–52.0)
Hemoglobin: 11 g/dL — ABNORMAL LOW (ref 13.0–17.0)
MCH: 26.8 pg (ref 26.0–34.0)
MCHC: 29.3 g/dL — AB (ref 30.0–36.0)
MCV: 91.2 fL (ref 80.0–100.0)
PLATELETS: 110 10*3/uL — AB (ref 150–400)
RBC: 4.11 MIL/uL — ABNORMAL LOW (ref 4.22–5.81)
RDW: 17.1 % — AB (ref 11.5–15.5)
WBC: 5.3 10*3/uL (ref 4.0–10.5)
nRBC: 0 % (ref 0.0–0.2)

## 2018-07-18 LAB — BASIC METABOLIC PANEL
Anion gap: 9 (ref 5–15)
BUN: 36 mg/dL — AB (ref 8–23)
CALCIUM: 8 mg/dL — AB (ref 8.9–10.3)
CHLORIDE: 104 mmol/L (ref 98–111)
CO2: 23 mmol/L (ref 22–32)
CREATININE: 1.13 mg/dL (ref 0.61–1.24)
GFR calc Af Amer: >60 mL/min (ref >60)
Glucose, Bld: 107 mg/dL — ABNORMAL HIGH (ref 70–99)
Potassium: 4.4 mmol/L (ref 3.5–5.1)
SODIUM: 136 mmol/L (ref 135–145)

## 2018-07-18 LAB — GLUCOSE, CAPILLARY
GLUCOSE-CAPILLARY: 105 mg/dL — AB (ref 70–99)
GLUCOSE-CAPILLARY: 117 mg/dL — AB (ref 70–99)
Glucose-Capillary: 142 mg/dL — ABNORMAL HIGH (ref 70–99)
Glucose-Capillary: 156 mg/dL — ABNORMAL HIGH (ref 70–99)

## 2018-07-18 LAB — HEPARIN LEVEL (UNFRACTIONATED): HEPARIN UNFRACTIONATED: 0.64 [IU]/mL (ref 0.30–0.70)

## 2018-07-18 SURGERY — ABDOMINAL AORTOGRAM W/LOWER EXTREMITY
Anesthesia: LOCAL | Laterality: Right

## 2018-07-18 MED ORDER — HEPARIN (PORCINE) IN NACL 100-0.45 UNIT/ML-% IJ SOLN
1100.0000 [IU]/h | INTRAMUSCULAR | Status: DC
  Administered 2018-07-18: 1000 [IU]/h via INTRAVENOUS
  Filled 2018-07-18 (×2): qty 250

## 2018-07-18 MED ORDER — FENTANYL CITRATE (PF) 100 MCG/2ML IJ SOLN
INTRAMUSCULAR | Status: AC
  Filled 2018-07-18: qty 2

## 2018-07-18 MED ORDER — HYDRALAZINE HCL 20 MG/ML IJ SOLN: 10.0000 mg | INTRAMUSCULAR | Status: DC | PRN

## 2018-07-18 MED ORDER — IODIXANOL 320 MG/ML IV SOLN
INTRAVENOUS | Status: DC | PRN
  Administered 2018-07-18: 90 mL via INTRA_ARTERIAL

## 2018-07-18 MED ORDER — ONDANSETRON HCL 4 MG/2ML IJ SOLN: 4.0000 mg | Freq: Four times a day (QID) | INTRAMUSCULAR | Status: DC | PRN

## 2018-07-18 MED ORDER — SODIUM CHLORIDE 0.9 % WEIGHT BASED INFUSION: 1.0000 mL/kg/h | INTRAVENOUS | Status: AC

## 2018-07-18 MED ORDER — HEPARIN SODIUM (PORCINE) 1000 UNIT/ML IJ SOLN
INTRAMUSCULAR | Status: AC
  Filled 2018-07-18: qty 1

## 2018-07-18 MED ORDER — HEPARIN (PORCINE) IN NACL 1000-0.9 UT/500ML-% IV SOLN
INTRAVENOUS | Status: DC | PRN
  Administered 2018-07-18: 500 mL

## 2018-07-18 MED ORDER — MIDAZOLAM HCL 2 MG/2ML IJ SOLN
INTRAMUSCULAR | Status: AC
  Filled 2018-07-18: qty 2

## 2018-07-18 MED ORDER — LABETALOL HCL 5 MG/ML IV SOLN
INTRAVENOUS | Status: AC
  Filled 2018-07-18: qty 4

## 2018-07-18 MED ORDER — LABETALOL HCL 5 MG/ML IV SOLN
INTRAVENOUS | Status: DC | PRN
  Administered 2018-07-18 (×2): 10 mg via INTRAVENOUS

## 2018-07-18 MED ORDER — CLOPIDOGREL BISULFATE 300 MG PO TABS
ORAL_TABLET | ORAL | Status: AC
  Filled 2018-07-18: qty 1

## 2018-07-18 MED ORDER — CLOPIDOGREL BISULFATE 75 MG PO TABS
75.0000 mg | ORAL_TABLET | Freq: Every day | ORAL | Status: DC
  Administered 2018-07-19 – 2018-07-21 (×3): 75 mg via ORAL
  Filled 2018-07-18 (×3): qty 1

## 2018-07-18 MED ORDER — SODIUM CHLORIDE 0.9% FLUSH
3.0000 mL | Freq: Two times a day (BID) | INTRAVENOUS | Status: DC
  Administered 2018-07-24: 3 mL via INTRAVENOUS

## 2018-07-18 MED ORDER — SODIUM CHLORIDE 0.9 % IV SOLN
INTRAVENOUS | Status: DC
  Administered 2018-07-18 (×2): via INTRAVENOUS

## 2018-07-18 MED ORDER — TRAMADOL HCL 50 MG PO TABS
50.0000 mg | ORAL_TABLET | Freq: Four times a day (QID) | ORAL | Status: DC | PRN
  Administered 2018-07-18 – 2018-07-22 (×4): 50 mg via ORAL
  Filled 2018-07-18 (×5): qty 1

## 2018-07-18 MED ORDER — SODIUM CHLORIDE 0.9 % IV SOLN: 250.0000 mL | INTRAVENOUS | Status: DC | PRN

## 2018-07-18 MED ORDER — ASPIRIN EC 81 MG PO TBEC
81.0000 mg | DELAYED_RELEASE_TABLET | Freq: Every day | ORAL | Status: DC
  Administered 2018-07-18 – 2018-07-24 (×6): 81 mg via ORAL
  Filled 2018-07-18 (×7): qty 1

## 2018-07-18 MED ORDER — HEPARIN (PORCINE) IN NACL 1000-0.9 UT/500ML-% IV SOLN
INTRAVENOUS | Status: AC
  Filled 2018-07-18: qty 1000

## 2018-07-18 MED ORDER — CLOPIDOGREL BISULFATE 300 MG PO TABS
ORAL_TABLET | ORAL | Status: DC | PRN
  Administered 2018-07-18: 300 mg via ORAL

## 2018-07-18 MED ORDER — ACETAMINOPHEN 325 MG PO TABS
650.0000 mg | ORAL_TABLET | ORAL | Status: DC | PRN
  Administered 2018-07-18: 650 mg via ORAL
  Filled 2018-07-18: qty 2

## 2018-07-18 MED ORDER — HYDRALAZINE HCL 20 MG/ML IJ SOLN
5.0000 mg | INTRAMUSCULAR | Status: DC | PRN
  Administered 2018-07-18: 5 mg via INTRAVENOUS

## 2018-07-18 MED ORDER — LABETALOL HCL 5 MG/ML IV SOLN: 10.0000 mg | INTRAVENOUS | Status: DC | PRN

## 2018-07-18 MED ORDER — SODIUM CHLORIDE 0.9% FLUSH: 3.0000 mL | INTRAVENOUS | Status: DC | PRN

## 2018-07-18 MED ORDER — HEPARIN SODIUM (PORCINE) 1000 UNIT/ML IJ SOLN
INTRAMUSCULAR | Status: DC | PRN
  Administered 2018-07-18: 7000 [IU] via INTRAVENOUS

## 2018-07-18 MED ORDER — HYDRALAZINE HCL 20 MG/ML IJ SOLN
INTRAMUSCULAR | Status: AC
  Filled 2018-07-18: qty 1

## 2018-07-18 MED ORDER — LIDOCAINE HCL (PF) 1 % IJ SOLN
INTRAMUSCULAR | Status: AC
  Filled 2018-07-18: qty 30

## 2018-07-18 SURGICAL SUPPLY — 19 items
BALLN MUSTANG 6.0X40 135 (BALLOONS) ×3
BALLOON MUSTANG 6.0X40 135 (BALLOONS) ×2 IMPLANT
CATH ANGIO 5F PIGTAIL 65CM (CATHETERS) ×3 IMPLANT
CATH CROSS OVER TEMPO 5F (CATHETERS) ×3 IMPLANT
CATH STRAIGHT 5FR 65CM (CATHETERS) ×3 IMPLANT
KIT ENCORE 26 ADVANTAGE (KITS) ×3 IMPLANT
KIT MICROPUNCTURE NIT STIFF (SHEATH) ×3 IMPLANT
KIT PV (KITS) ×3 IMPLANT
SHEATH PINNACLE 5F 10CM (SHEATH) ×3 IMPLANT
SHEATH PINNACLE ST 6F 45CM (SHEATH) ×3 IMPLANT
SHEATH PROBE COVER 6X72 (BAG) ×3 IMPLANT
STENT ABSOLUTE PRO 7X40X135 (Permanent Stent) ×3 IMPLANT
STOPCOCK MORSE 400PSI 3WAY (MISCELLANEOUS) ×3 IMPLANT
SYRINGE MEDRAD AVANTA MACH 7 (SYRINGE) ×3 IMPLANT
TRANSDUCER W/STOPCOCK (MISCELLANEOUS) ×3 IMPLANT
TRAY PV CATH (CUSTOM PROCEDURE TRAY) ×3 IMPLANT
TUBING CIL FLEX 10 FLL-RA (TUBING) ×3 IMPLANT
WIRE BENTSON .035X145CM (WIRE) ×3 IMPLANT
WIRE HI TORQ VERSACORE J 260CM (WIRE) ×3 IMPLANT

## 2018-07-18 NOTE — Progress Notes (Signed)
PT Cancellation Note  Patient Details Name: Jettson Crable MRN: [id number] DOB: 10-Apr-1946   Cancelled Treatment:    Reason Eval/Treat Not Completed: Patient at procedure or test/unavailable. PT will continue to follow acutely.   Clearnce Sorrel Brinden Kincheloe 07/18/2018, 11:49 AM

## 2018-07-18 NOTE — Progress Notes (Signed)
Site area: Right 7 french arterial sheath was removed groin  Site Prior to Removal:  Level 0  Pressure Applied For 31mins MINUTES    Bedrest Beginning at 1630p  Manual:   Yes.    Patient Status During Pull:  stable  Post Pull Groin Site:  Level 0  Post Pull Instructions Given:  Yes.    Post Pull Pulses Present:  Yes.    Dressing Applied:  Yes.    Comments:  BP treated with Hydralzine 5mg  and Home  BP meds

## 2018-07-18 NOTE — Progress Notes (Signed)
CCMD reported pt having 10 beats of Vtach. Pt not in any apparent distress. Pt alert and states "I feel good". Denies chest pain. BP 148/82 O2 98% HR 59 MD notified. Will continue to monitor.

## 2018-07-18 NOTE — Clinical Social Work Note (Signed)
Clinical Social Work Assessment  Patient Details  Name: Derek Hinton MRN: [id number] Date of Birth: 1946-01-23  Date of referral:  07/18/18               Reason for consult:  Facility Placement                Permission sought to share information with:  Facility Sport and exercise psychologist, Family Supports Permission granted to share information::  Yes, Verbal Permission Granted  Name::     [place]  Agency::  SNFs  Relationship::  Son  Contact Information:  [phone]  Housing/Transportation Living arrangements for the past 2 months:  [place] of Information:  Patient, Adult Children Patient Interpreter Needed:  None Criminal Activity/Legal Involvement Pertinent to Current Situation/Hospitalization:  No - Comment as needed Significant Relationships:  Adult Children Lives with:  Self Do you feel safe going back to the place where you live?  No Need for family participation in patient care:  No (Coment)  Care giving concerns: CSW received consult for possible SNF placement at time of discharge. CSW spoke with patient and daughter regarding PT recommendation of SNF placement at time of discharge. Patient reported that he lives alone and  is currently unable to care for himself given patient's current physical needs and fall risk. Patient expressed understanding of PT recommendation and is agreeable to SNF placement at time of discharge. CSW to continue to follow and assist with discharge planning needs.   Social Worker assessment / plan:  CSW spoke with patient concerning possibility of rehab at [facility] before returning home.  Employment status:  Retired Forensic scientist:  Medicare PT Recommendations:  [place] / Referral to community resources:  [place]  Patient/Family's Response to care:  Patient recognizes need for rehab before returning home and is agreeable to a SNF in [place]. Patient reported preference for  [facility] in [place]. CSW sent them referral for review.   Patient/Family's Understanding of and Emotional Response to Diagnosis, Current Treatment, and Prognosis:  Patient/family is realistic regarding therapy needs and expressed being hopeful for SNF placement. Patient expressed understanding of CSW role and discharge process as well as medical condition. No questions/concerns about plan or treatment.    Emotional Assessment Appearance:  Appears stated age Attitude/Demeanor/Rapport:  Gracious, Engaged Affect (typically observed):  Accepting, Appropriate Orientation:  Oriented to Self, Oriented to Place, Oriented to  Time, Oriented to Situation Alcohol / Substance use:  Not Applicable Psych involvement (Current and /or in the community):  No (Comment)  Discharge Needs  Concerns to be addressed:  Care Coordination Readmission within the last 30 days:  No Current discharge risk:  Dependent with Mobility Barriers to Discharge:  Continued Medical Work up   [employer], LCSW 07/18/2018, 10:27 AM

## 2018-07-18 NOTE — Progress Notes (Signed)
ANTICOAGULATION CONSULT NOTE - Follow Up Consult  Pharmacy Consult for Heparin  Indication: atrial fibrillation and possible ischemic leg  Allergies  Allergen Reactions  . Penicillins Other (See Comments)    Pt unsure, states he "didn't feel good" Patient states it made him "feel Funny"     Patient Measurements: Height: 5\' 9"  (175.3 cm) Weight: 138 lb (62.6 kg) IBW/kg (Calculated) : 70.7 Heparin Dosing Weight: 62.6 kg  Vital Signs: Temp: 97.3 F (36.3 C) (10/11 0442) Temp Source: Oral (10/11 0442) BP: 148/82 (10/11 0529) Pulse Rate: 59 (10/11 0529)  Labs: Recent Labs    07/15/18 2235  07/17/18 0335 07/17/18 1136 07/18/18 0254 07/18/18 0500  HGB 11.3*  --  10.7*  --   --  11.0*  HCT 38.1*  --  35.0*  --   --  37.5*  PLT 123*  --  98*  --   --  110*  HEPARINUNFRC  --    < > 0.19* 0.38 0.64  --   CREATININE 1.35*  --  1.11  --   --  1.13  CKTOTAL 276  --   --   --   --   --    < > = values in this interval not displayed.    Estimated Creatinine Clearance: 50.8 mL/min (by C-G formula based on SCr of 1.13 mg/dL).  Assessment:  31 yom presented to the ED with AMS. Found to be in new onset afib and possible with an ischemic leg.  Heparin level therapeutic CBC stable  Goal of Therapy:  Heparin level 0.3-0.7 units/ml Monitor platelets by anticoagulation protocol: Yes   Plan:  Continue heparin at 1100 units / hr Follow up AM labs  Thank you Anette Guarneri, PharmD [phone]

## 2018-07-18 NOTE — Progress Notes (Signed)
Received report from RN; Introduce staff and orient to room; Reviewed  Care plan; VSS; IV fluids running; A/O x 4; Pain 0/10; Bed in low position; Head at 30 degrees; Call bell within reach; Family visiting; Will continue to monitor.

## 2018-07-18 NOTE — Consult Note (Addendum)
Cardiology Consultation:   Patient ID: Derek Hinton MRN: [id number]; DOB: 1946/09/09  Admit date: 07/15/2018 Date of Consult: 07/18/2018  Primary Care Provider: Sheral Apley, MD Primary Cardiologist: New (Dr. Sallyanne Kuster) Primary Electrophysiologist:  None   Patient Profile:   Derek Hinton is a 72 y.o. male with a hx of PVD s/p L BKA, 60 + year h/o tobacco abuse (quit 3 months ago) DM, HTN, COPD who is being seen today for the evaluation of systolic HF and atrial fibrillation, at the request of Dr. Sloan Leiter, Internal Medicine.   History of Present Illness:   Derek Hinton is a 72 y.o. male with a hx of PVD s/p L BKA, 60 + year h/o tobacco abuse (quit 3 months ago) DM, HTN, COPD who is being seen today for the evaluation of systolic HF and atrial fibrillation, at the request of Dr. Sloan Leiter, Internal Medicine.   Pt with no prior cardiac history but multiple risk factors including DM, HTN and PVD s/p left BKA (has prosthesis). He presented to [facility] on 07/15/18 after being found down in his home (lives alone) in a confused state. On arrival to [facility], he was noted to be in atrial fibrillation and there was concern regarding right LE ischemia.  Head CT negative for acute intracranial findings, however + for chronic ischemic microvascular white matter disease and small remote left thalamic lacunar infarct.   He was admitted by IM and started on IV heparin. Vascular surgery was consulted given initial concerns for critical limb ischemia. Pt was seen by Dr. Donnetta Hutching and was not felt to have profound ischemia to warrant emergent intervention. LE arterial duplex did however show right 75-99% stenosis in the proximal to mid segment of the superficial femoral artery. Plan is to undergo abdominal aortogram with lower extremity evaluation.   For his afib, he is being treated with  blocker for rate control and IV heparin for anticoagulation. K has been WNL this entire admit. No Mg or TSH labs. CBC shows  anemia. Hgb 11. Platelets 110. As part of w/u a 2D echo was obtained and showed severely reduced LVEF at 15-20%. No baseline for comparison. Diffuse hypokinesis noted with G2DD. There is mild MR, severe TR. Systolic pulmonary pressure moderately increased at 47 mm Hg. The RA and LA are both severely dilated.  Pt just underwent LE PV angiogram and underwent successful angioplasty and stenting of the right external iliac artery, by Dr. Scot Dock.   He denies any recent cardiac symptoms. No CP or dyspnea. Also denies palpitations, dizziness, syncope/ near syncope.    Past Medical History:  Diagnosis Date  . COPD (chronic obstructive pulmonary disease) (HCC)    not on home O2  . Diabetes mellitus without complication ([place])   . Hypertension   . PVD (peripheral vascular disease) ([place])    s/p L BKA    Past Surgical History:  Procedure Laterality Date  . BELOW KNEE LEG AMPUTATION       Home Medications:  Prior to Admission medications   Medication Sig Start Date End Date Taking? Authorizing Provider  atorvastatin (LIPITOR) 40 MG tablet Take 20 mg by mouth daily. 01/29/17  Yes [provider]  budesonide-formoterol (SYMBICORT) 160-4.5 MCG/ACT inhaler Inhale 2 puffs into the lungs 2 (two) times daily. 06/12/18  Yes [provider]  glipiZIDE (GLUCOTROL XL) 5 MG 24 hr tablet Take 5 mg by mouth daily. 06/06/18  Yes [provider]  metFORMIN (GLUCOPHAGE) 1000 MG tablet Take 1,000 mg by mouth 2 (two) times daily.  12/18/17  Yes [provider]  metoprolol tartrate (LOPRESSOR) 50 MG tablet Take 50 mg by mouth 2 (two) times daily. 04/28/18  Yes [provider]  ramipril (ALTACE) 2.5 MG capsule Take 2.5 mg by mouth daily. 04/28/18  Yes [provider]  traMADol (ULTRAM) 50 MG tablet Take 50 mg by mouth every 6 (six) hours as needed for moderate pain.  04/28/18  Yes [provider]    Inpatient Medications: Scheduled Meds: . atorvastatin  40 mg  Oral Daily  . docusate sodium  100 mg Oral BID  . feeding supplement (GLUCERNA SHAKE)  237 mL Oral BID BM  . insulin aspart  0-15 Units Subcutaneous TID WC  . insulin aspart  0-5 Units Subcutaneous QHS  . metoprolol tartrate  50 mg Oral BID  . mometasone-formoterol  2 puff Inhalation BID  . multivitamin with minerals  1 tablet Oral Daily  . ramipril  2.5 mg Oral Daily  . sodium chloride flush  3 mL Intravenous Q12H   Continuous Infusions: . sodium chloride Stopped (07/18/18 0700)  . sodium chloride 100 mL/hr at 07/18/18 0919  . heparin 1,100 Units/hr (07/18/18 0922)   PRN Meds: acetaminophen **OR** acetaminophen, albuterol, morphine injection, ondansetron **OR** ondansetron (ZOFRAN) IV  Allergies:    Allergies  Allergen Reactions  . Penicillins Other (See Comments)    Pt unsure, states he "didn't feel good" Patient states it made him "feel Funny"     Social History:   Social History   Socioeconomic History  . Marital status: Divorced    Spouse name: Not on file  . Number of children: Not on file  . Years of education: Not on file  . Highest education level: Not on file  Occupational History  . Occupation: retired  Scientific laboratory technician  . Financial resource strain: Not on file  . Food insecurity:    Worry: Not on file    Inability: Not on file  . Transportation needs:    Medical: Not on file    Non-medical: Not on file  Tobacco Use  . Smoking status: Former Smoker    Packs/day: 1.00    Years: 60.00    Pack years: 60.00    Last attempt to quit: 12/2017    Years since quitting: 0.6  . Smokeless tobacco: Never Used  Substance and Sexual Activity  . Alcohol use: Not Currently  . Drug use: Never  . Sexual activity: Not on file  Lifestyle  . Physical activity:    Days per week: Not on file    Minutes per session: Not on file  . Stress: Not on file  Relationships  . Social connections:    Talks on phone: Not on file    Gets together: Not on file    Attends  religious service: Not on file    Active member of club or organization: Not on file    Attends meetings of clubs or organizations: Not on file    Relationship status: Not on file  . Intimate partner violence:    Fear of current or ex partner: Not on file    Emotionally abused: Not on file    Physically abused: Not on file    Forced sexual activity: Not on file  Other Topics Concern  . Not on file  Social History Narrative  . Not on file    Family History:    Family History  Problem Relation Age of Onset  . Diabetes Father  ROS:  Please see the history of present illness.   All other ROS reviewed and negative.     Physical Exam/Data:   Vitals:   07/18/18 0442 07/18/18 0446 07/18/18 0529 07/18/18 0825  BP: (!) 147/82  (!) 148/82   Pulse: (!) 53 (!) 46 (!) 59   Resp: 18     Temp: (!) 97.3 F (36.3 C)     TempSrc: Oral     SpO2: (!) 74% 98% 98% 100%  Weight:      Height:        Intake/Output Summary (Last 24 hours) at 07/18/2018 1114 Last data filed at 07/18/2018 0446 Gross per 24 hour  Intake 1807.32 ml  Output 500 ml  Net 1307.32 ml   Filed Weights   07/16/18 1100  Weight: 62.6 kg   Body mass index is 20.38 kg/m.  General:  Well nourished, well developed, in no acute distress HEENT: normal Lymph: no adenopathy Neck: no JVD Endocrine:  No thryomegaly Vascular: No carotid bruits; FA pulses 2+ bilaterally without bruits  Cardiac:  irregularly irregular rhythm, regular rate Lungs:  clear to auscultation bilaterally, no wheezing, rhonchi or rales  Abd: soft, nontender, no hepatomegaly  Ext: s/p rt BKA, left LEE with 2+ pedal edema and erythema  Musculoskeletal:  S/p left BKA  Skin: warm and dry  Neuro:  CNs 2-12 intact, no focal abnormalities noted Psych:  Normal affect   EKG:  The EKG was personally reviewed and demonstrates:  07/15/18 afib 106 Telemetry:  Telemetry was personally reviewed and demonstrates:  afib 77 bpm   Relevant CV Studies: 2D  Echo 07/17/18 Study Conclusions  - Left ventricle: The cavity size was mildly dilated. Systolic   function was severely reduced. The estimated ejection fraction   was in the range of 15% to 20%. Diffuse hypokinesis. Features are   consistent with a pseudonormal left ventricular filling pattern,   with concomitant abnormal relaxation and increased filling   pressure (grade 2 diastolic dysfunction). - Aortic valve: Trileaflet; mildly thickened, mildly calcified   leaflets. - Mitral valve: There was mild regurgitation. - Left atrium: The atrium was severely dilated. Volume/bsa, ES   (1-plane Simpson&'s, A4C): 69.5 ml/m^2. - Right atrium: The atrium was severely dilated. - Tricuspid valve: There was severe regurgitation. - Pulmonary arteries: Systolic pressure was moderately increased.   PA peak pressure: 47 mm Hg (S). - Pericardium, extracardiac: There was a left pleural effusion.  Laboratory Data:  Chemistry Recent Labs  Lab 07/15/18 2235 07/17/18 0335 07/18/18 0500  NA 132* 135 136  K 4.8 4.4 4.4  CL 100 105 104  CO2 21* 23 23  GLUCOSE 297* 69* 107*  BUN 30* 31* 36*  CREATININE 1.35* 1.11 1.13  CALCIUM 8.7* 8.0* 8.0*  GFRNONAA 50* >60 >60  GFRAA 58* >60 >60  ANIONGAP 11 7 9     Recent Labs  Lab 07/15/18 2235  PROT 6.7  ALBUMIN 3.2*  AST 38  ALT 19  ALKPHOS 93  BILITOT 1.6*   Hematology Recent Labs  Lab 07/15/18 2235 07/17/18 0335 07/18/18 0500  WBC 7.0 4.5 5.3  RBC 4.22 3.89* 4.11*  HGB 11.3* 10.7* 11.0*  HCT 38.1* 35.0* 37.5*  MCV 90.3 90.0 91.2  MCH 26.8 27.5 26.8  MCHC 29.7* 30.6 29.3*  RDW 17.3* 17.0* 17.1*  PLT 123* 98* 110*   Cardiac EnzymesNo results for input(s): TROPONINI in the last 168 hours. No results for input(s): TROPIPOC in the last 168 hours.  BNPNo results for  input(s): BNP, PROBNP in the last 168 hours.  DDimer No results for input(s): DDIMER in the last 168 hours.  Radiology/Studies:  Ct Head Wo Contrast  Result Date:  07/16/2018 CLINICAL DATA:  Altered mental status.  Hypertension and diabetes. EXAM: CT HEAD WITHOUT CONTRAST TECHNIQUE: Contiguous axial images were obtained from the base of the skull through the vertex without intravenous contrast. COMPARISON:  None. FINDINGS: Brain: Small remote lacunar infarct of the left thalamus, image 19/3. Periventricular white matter and corona radiata hypodensities favor chronic ischemic microvascular white matter disease. Otherwise, the brainstem, cerebellum, cerebral peduncles, thalami, basal ganglia, basilar cisterns, and ventricular system appear within normal limits. No intracranial hemorrhage, mass lesion, or acute CVA. Vascular: Atherosclerotic calcification of the vertebral arteries (especially the left) with punctate atherosclerotic calcification in the basilar artery. There is atherosclerotic calcification of the cavernous carotid arteries bilaterally. Skull: Unremarkable Sinuses/Orbits: Unremarkable Other: No supplemental non-categorized findings. IMPRESSION: 1. No acute intracranial findings. 2. Periventricular white matter and corona radiata hypodensities favor chronic ischemic microvascular white matter disease. 3. Small remote left thalamic lacunar infarct. 4. Atherosclerosis. Electronically Signed   By: Van Clines M.D.   On: 07/16/2018 03:01    Assessment and Plan:   Derek Hinton is a 72 y.o. male with a hx of PVD s/p L BKA, 60 + year h/o tobacco abuse (quit 3 months ago) DM, HTN, COPD who is being seen today for the evaluation of systolic HF and atrial fibrillation, at the request of Dr. Sloan Leiter, Internal Medicine.    1. Systolic HF/ Cardiomyopathy: severely reduced LVEF at 15-20% with diffuse hypokinesis noted. No baseline echocardiograms for comparison. He denies any recent CP and no dyspnea.  It is possible that this may be tachy mediated cardiomyopathy given presence of atrial fibrillation of unknown duration, however given his multiple cardiac risk  factors including DM, heavy tobacco use, HTN and known PVD, we must exclude coronary artery diease/ coronary ischemia as the etiology. Recommend definitive LHC. Given his pulmonary HTN and severe TR, he would also benefit from [place] as well. He also needs guidelines directed medical therapy for HF. He is currently on an ACE-I, ramipril. Renal function is normal w/ Scr at 1.13 and K WNL at 4.4. Recommend discontinuation of his ACEi. Allow 36 hr washout and initiate Entresto. Also continue BB therapy, but recommend switch from metoprolol tartrate to either metoprolol succinate or Coreg. Additional HF therapies can be later initiated, including spironolactone, hydralazine, nitrates and possibly digoxin, based on BP and HR. No [place] given atrial fibrillation.   2. Atrial Fibrillation: new diagnosis. No prior EKGs for comparison. Unknown duration. HR controlled with  blocker. He is on IV heparin currently for anticoagulation.  His CHA2DS2 VASc score is > 2 (CHF, HTN, DM, age 51-74, vascular disease and evidence of small remote infarct on head CT). He is pending PV evaluation and will undergo LE arteriogram and may also need LHC to exclude underling CAD given his severe LV dysfunction. Once all invasive procedures have been completed, would recommend transition to oral anticoagulation for longterm stroke prophylaxis. He would need close monitoring of H/H. Given his new afib and HF, will also check a TSH to assess for thyroid disease.   3. PVD: s/p left BKA. Concerns regarding ischemia on the right. Arterial duplex this admit showed right 75-99% stenosis in the proximal to mid segment of the superficial femoral artery. He had a PV angio earlier today with Dr. Scot Dock and was found to have obstructive right iliac disease  and underwent successful angioplasty and stenting of the right external iliac artery. Further management per Dr. Scot Dock.   4. HTN: mild-moderately elevated. He will need adjustment of his regimen  given new systolic HF. Likely changes to be made prior to discharge include discontinuation of ACEi and addition of Entresto. Titration of  blocker (Toprol XL vs Coreg), +/- spironolactone. We will assist with management.    5. DM: management per IM.   6. HLD: continue statin therapy with Lipitor. Given vascular disease, target LDL goal should be < 70 mg/dL.   7. Tobacco Abuse: former smoker of 60+ years. He recently quit 3 months ago. He was congratulated on his efforts and encouraged to continue to refrain for further use.   For questions or updates, please contact [place] Please consult [email or web address] for contact info under     Signed, Lyda Jester, PA-C  07/18/2018 11:14 AM   I have seen and examined the patient along with V.  I have reviewed the chart, notes and new data.  I agree with NP's note.  Key new complaints: remarkable for absence of angina or dyspnea at rest or with activity, although he reports lower activity level over the last year. He walks and even dances with his prosthesis. Key examination changes: s/p L BKA; RRR, 2/6 holosystolic LLSB murmur. Lying fully supine after PV procedure - no dyspnea. Key new findings / data: AFib, currently rate controlled, but mildly tachycardic on admission. Delayed R wave progression, no true Q waves; global hypokinesis on echo. Report of "grade 2 diastolic dysfunction" is not reliable in atrial fibrillation. Normal renal function.  PLAN: Primary suspicion is painless ischemic heart disease with diffuse CAD, for which he is at extremely high risk - well established PAD and virtually all risk factors present. Plan coronary angio Monday, assuming renal function does not deteriorate after peripheral angio. This procedure has been fully reviewed with the patient and written informed consent has been obtained. Tentatively scheduled 1200PM Monday Dr. Irish Lack. Less likely tachycardia cardiomyopathy related to atrial  fibrillation. Already on fair dose of beta blocker and low dose ACEi. Hold ACEi for cath. Can start Entresto after coronary angio since will be a 48 h washout. Hold metformin for cath. Remarkably well compensated - no need for diuretics. Start IV heparin for stroke prevention and transition to [place] after cath (unless he needs CABG).   Sanda Klein, MD, [place] [phone] 07/18/2018, 5:53 PM

## 2018-07-18 NOTE — Op Note (Signed)
PATIENT: Derek Hinton      MRN: [id number] DOB: 15-Jun-1946    DATE OF PROCEDURE: 07/18/2018  INDICATIONS:    Derek Hinton is a 72 y.o. male who was evaluated with right lower extremity ischemia and cellulitis.  He was set up for arteriography and possible intervention.  He has a BKA on the left side.  PROCEDURE:    1.  Conscious sedation 2.  Ultrasound-guided access to the left common femoral artery 3.  Aortogram with iliac arteriogram 4.  Second order catheterization of the right external iliac artery with right lower extremity runoff 5.  Angioplasty and stenting of the right external iliac artery (self-expanding 7 mm x 40 mm stent, postdilatation with 6 mm x 40 mm balloon)  SURGEON: Judeth Cornfield. Scot Dock, MD, FACS  ANESTHESIA: Local with sedation  EBL: Minimal  TECHNIQUE: The patient was taken to the peripheral vascular lab and was sedated. The period of conscious sedation was 62 minutes.  During that time period, I was present face-to-face 100% of the time.  The patient was administered half a milligram of Versed and 25 mcg of fentanyl. The patient's heart rate, blood pressure, and oxygen saturation were monitored by the nurse continuously during the procedure.  Both groins were prepped and draped in the usual sterile fashion.  Under ultrasound guidance, after the skin was anesthetized, I cannulated the left common femoral artery with a micropuncture needle and a micropuncture sheath was introduced over a wire.  This was exchanged for a 5 [place] sheath over a Bentson wire.  By ultrasound the femoral artery was patent. A real-time image was obtained and placed in the chart.   A pigtail catheter was positioned at the L1 vertebral body and flush aortogram obtained. I exchanged the pigtail catheter for a crossover catheter which was positioned into the right common iliac artery.  The wire was advanced into the external iliac artery and the crossover catheter exchanged for a straight  catheter.  Selective right external iliac arteriogram was obtained with right lower extremity runoff.  This catheter was then retracted into the common femoral artery and an LAO projection was obtained which demonstrated a 95% stenosis in the right external iliac artery.  I elected to address this with angioplasty and stenting.  I exchanged the 5 French sheath for a long 6 [place] sheath which was passed over the bifurcation just above the stenosis.  Patient was then heparinized and ACT was monitored throughout the case.  Next I advanced the sheath using the dilator through the stenosis.  I then positioned at the 7 mm x 40 mm self-expanding stent across the area of concern which have been marked and the sheath was then retracted.  The stent was then deployed without difficulty.  I then went back with a 6 mm x 40 mm balloon which was inflated to 12 atm for 1 minute.  Completion films showed no residual stenosis.  The catheter was then retracted back into the left external iliac artery.  The patient was transferred to the holding area for removal of the sheath.  No immediate complications were noted.   FINDINGS:   1.  Single renal arteries bilaterally with no significant renal artery stenosis identified.  The infrarenal aorta is widely patent.  2.  On the right side, which is the side of concern, there is mild disease in the common iliac artery.  The hypogastric artery is patent.  There was a tight 95% stenosis in the right external iliac artery which  was successfully addressed with balloon angioplasty and stenting as described above.  There was no residual stenosis.  Below that on the right there is a focal stenosis in the proximal deep femoral artery.  There is moderate to severe disease throughout the superficial femoral artery.  The popliteal artery is patent and there is two-vessel runoff on the right via the anterior tibial and peroneal arteries.  The posterior tibial artery is occluded.  Deitra Mayo, MD, FACS [facility]  DATE OF DICTATION:   07/18/2018

## 2018-07-18 NOTE — Progress Notes (Signed)
07/18/2018  1709 Pt transferred to 4E-13 via bed with staff from cath lab holding. Pt given CHG bath. Pt oriented to room and call bell. Call bell within reach. TELE applied, CCMD notified. Pt instructed to stay on bedrest until 2030, pt voiced understanding. Will continue to monitor.  Amanda Cockayne, RN

## 2018-07-18 NOTE — NC FL2 (Signed)
[place] LEVEL OF CARE SCREENING TOOL     IDENTIFICATION  Patient Name: Derek Hinton Birthdate: 11-28-1945 Sex: male Admission Date (Current Location): 07/15/2018  [facility] and [place] Number:  [employer] and Address:  [facility] [place], [place], [place] [address]      Provider Number: [id number]  Attending Physician Name and Address:  Jonetta Osgood, MD  Relative Name and Phone Number:  Mali, son, [phone]    Current Level of Care: Hospital Recommended Level of Care: [place] Prior Approval Number:    Date Approved/Denied:   PASRR Number: [id number]  Discharge Plan: SNF    Current Diagnoses: Patient Active Problem List   Diagnosis Date Noted  . Malnutrition of moderate degree 07/18/2018  . Acute encephalopathy 07/16/2018  . Lower limb ischemia 07/16/2018  . PVD (peripheral vascular disease) ([place]) 07/16/2018  . COPD (chronic obstructive pulmonary disease) ([place]) 07/16/2018  . Essential hypertension 07/16/2018  . Type 2 diabetes mellitus with other specified complication ([place]) 76/16/0737  . Unspecified atrial fibrillation ([place]) 07/16/2018    Orientation RESPIRATION BLADDER Height & Weight     Self, Time, Situation, Place  O2(Nasal cannula 2L) Incontinent Weight: 62.6 kg Height:  5\' 9"  (175.3 cm)  BEHAVIORAL SYMPTOMS/MOOD NEUROLOGICAL BOWEL NUTRITION STATUS      Incontinent Diet(Please see DC Summary)  AMBULATORY STATUS COMMUNICATION OF NEEDS Skin   Extensive Assist Verbally PU Stage and Appropriate Care(Stage II on sacrum; Stage I on buttocks)                       Personal Care Assistance Level of Assistance  Bathing, Feeding, Dressing Bathing Assistance: Maximum assistance Feeding assistance: Limited assistance Dressing Assistance: Limited assistance     Functional Limitations Info  Sight, Hearing, Speech Sight Info: Adequate Hearing Info: Adequate Speech Info:  Adequate    SPECIAL CARE FACTORS FREQUENCY  PT (By licensed PT), OT (By licensed OT)     PT Frequency: 5x/week OT Frequency: 3x/week            Contractures Contractures Info: Not present    Additional Factors Info  Code Status, Allergies, Insulin Sliding Scale Code Status Info: DNR Allergies Info: Penicillins   Insulin Sliding Scale Info: 3x daily with meals and at bedtime       Current Medications (07/18/2018):  This is the current hospital active medication list Current Facility-Administered Medications  Medication Dose Route Frequency Provider Last Rate Last Dose  . 0.9 %  sodium chloride infusion   Intravenous Continuous Jonetta Osgood, MD   Stopped at 07/18/18 0700  . 0.9 %  sodium chloride infusion   Intravenous Continuous Angelia Mould, MD 100 mL/hr at 07/18/18 0919    . acetaminophen (TYLENOL) tablet 650 mg  650 mg Oral Q6H PRN Karmen Bongo, MD       Or  . acetaminophen (TYLENOL) suppository 650 mg  650 mg Rectal Q6H PRN Karmen Bongo, MD      . albuterol (PROVENTIL) (2.5 MG/3ML) 0.083% nebulizer solution 2.5 mg  2.5 mg Nebulization Q2H PRN Karmen Bongo, MD      . atorvastatin (LIPITOR) tablet 40 mg  40 mg Oral Daily Karmen Bongo, MD   40 mg at 07/17/18 1229  . docusate sodium (COLACE) capsule 100 mg  100 mg Oral BID Karmen Bongo, MD   100 mg at 07/17/18 2235  . feeding supplement (GLUCERNA SHAKE) (GLUCERNA SHAKE) liquid 237 mL  237 mL Oral BID BM Jonetta Osgood, MD   237 mL at 07/17/18 1658  . heparin ADULT infusion 100 units/mL (25000 units/233mL sodium chloride 0.45%)  1,100 Units/hr Intravenous Continuous Skeet Simmer, RPH 11 mL/hr at 07/18/18 9201 1,100 Units/hr at 07/18/18 0071  . insulin aspart (novoLOG) injection 0-15 Units  0-15 Units Subcutaneous TID WC Karmen Bongo, MD   5 Units at 07/17/18 1238  . insulin aspart (novoLOG) injection 0-5 Units  0-5 Units Subcutaneous QHS Karmen Bongo, MD   2 Units at 07/17/18 2235  .  metoprolol tartrate (LOPRESSOR) tablet 50 mg  50 mg Oral BID Karmen Bongo, MD   50 mg at 07/17/18 2235  . mometasone-formoterol (DULERA) 200-5 MCG/ACT inhaler 2 puff  2 puff Inhalation BID Karmen Bongo, MD   2 puff at 07/18/18 [phone]  . morphine 2 MG/ML injection 2 mg  2 mg Intravenous Q2H PRN Karmen Bongo, MD      . multivitamin with minerals tablet 1 tablet  1 tablet Oral Daily Jonetta Osgood, MD   1 tablet at 07/17/18 1658  . ondansetron (ZOFRAN) tablet 4 mg  4 mg Oral Q6H PRN Karmen Bongo, MD       Or  . ondansetron [facility]) injection 4 mg  4 mg Intravenous Q6H PRN Karmen Bongo, MD      . ramipril (ALTACE) capsule 2.5 mg  2.5 mg Oral Daily Karmen Bongo, MD   2.5 mg at 07/17/18 1229  . sodium chloride flush (NS) 0.9 % injection 3 mL  3 mL Intravenous Q12H Karmen Bongo, MD   3 mL at 07/17/18 2242     Discharge Medications: Please see discharge summary for a list of discharge medications.  Relevant Imaging Results:  Relevant Lab Results:   Additional Information SSN: [place], [place]

## 2018-07-18 NOTE — Interval H&P Note (Signed)
History and Physical Interval Note:  07/18/2018 11:40 AM  Derek Hinton  has presented today for surgery, with the diagnosis of pvd  The various methods of treatment have been discussed with the patient and family. After consideration of risks, benefits and other options for treatment, the patient has consented to  Procedure(s): ABDOMINAL AORTOGRAM W/LOWER EXTREMITY (N/A) as a surgical intervention .  The patient's history has been reviewed, patient examined, no change in status, stable for surgery.  I have reviewed the patient's chart and labs.  Questions were answered to the patient's satisfaction.     Deitra Mayo

## 2018-07-18 NOTE — Progress Notes (Signed)
PROGRESS NOTE        PATIENT DETAILS Name: Derek Hinton Age: 72 y.o. Sex: male Date of Birth: [birth year] Admit Date: 07/15/2018 Admitting Physician Karmen Bongo, MD ALP:[id number], Jenny Reichmann, MD  Brief Narrative: Patient is a 72 y.o. male with prior history of left BKA, DM-2, hypertension presented with confusion-he was apparently found down on the floor (was on the floor for almost 2 days)-he was also found to have new onset atrial fibrillation, and worsening leg ischemia.  He was subsequently admitted for further evaluation and treatment.  Subjective: No chest pain or shortness of breath.  Lying comfortably in bed.  Assessment/Plan: Acute metabolic encephalopathy: Resolved, suspect secondary to dehydration from being on the floor for almost 2 days. .    New onset atrial fibrillation: Rate controlled with metoprolol, continue IV heparin.  Echocardiogram shows significantly decreased EF-telemetry with persistent A. fib.  Once all procedures are complete-patient will need to be transitioned to oral anticoagulation.  Newly diagnosed chronic systolic heart failure: Volume status appears stable-given history of PAD, carotid stenosis-could have underlying CAD-continue metoprolol and ramipril.  Have consulted cardiology today.  DM-2: CBG stable with SSI.  Hypertension: BP controlled, continue metoprolol and ramipril.  Peripheral vascular disease-with probable critical limb ischemia: Underwent arteriogram on 10/11 which showed 95% stenosis in the right external iliac artery-patient is now status post balloon angioplasty.  Continue statin-await further recommendations from vascular surgery.   COPD: Stable without any exacerbation-continue bronchodilators.  Left BKA  DVT Prophylaxis: Full dose anticoagulation with Heparin  Code Status: Full code   Family Communication: Daughter at bedside  Disposition Plan: Remain inpatient-but will plan on Home health vs SNF  on discharge  Antimicrobial agents: Anti-infectives (From admission, onward)   None     Procedures: None  CONSULTS:  vascular surgery  Time spent: 25 minutes-Greater than 50% of this time was spent in counseling, explanation of diagnosis, planning of further management, and coordination of care.  MEDICATIONS: Scheduled Meds: . [MAR Hold] atorvastatin  40 mg Oral Daily  . [MAR Hold] docusate sodium  100 mg Oral BID  . [MAR Hold] feeding supplement (GLUCERNA SHAKE)  237 mL Oral BID BM  . [MAR Hold] insulin aspart  0-15 Units Subcutaneous TID WC  . [MAR Hold] insulin aspart  0-5 Units Subcutaneous QHS  . [MAR Hold] metoprolol tartrate  50 mg Oral BID  . [MAR Hold] mometasone-formoterol  2 puff Inhalation BID  . [MAR Hold] multivitamin with minerals  1 tablet Oral Daily  . [MAR Hold] ramipril  2.5 mg Oral Daily  . [MAR Hold] sodium chloride flush  3 mL Intravenous Q12H   Continuous Infusions: . sodium chloride Stopped (07/18/18 0700)  . sodium chloride 100 mL/hr at 07/18/18 0919  . heparin 1,100 Units/hr (07/18/18 0922)   PRN Meds:.[MAR Hold] acetaminophen **OR** [MAR Hold] acetaminophen, [MAR Hold] albuterol, [MAR Hold]  morphine injection, [MAR Hold] ondansetron **OR** [MAR Hold] ondansetron (ZOFRAN) IV   PHYSICAL EXAM: Vital signs: Vitals:   07/18/18 0825 07/18/18 1140 07/18/18 1324 07/18/18 1325  BP:   (!) 157/92 (!) 158/92  Pulse:    73  Resp:    15  Temp:      TempSrc:      SpO2: 100% 100%  98%  Weight:      Height:       Filed Weights   07/16/18 1100  Weight: 62.6 kg   Body mass index is 20.38 kg/m.   General appearance:Awake, alert, not in any distress.  Eyes:no scleral icterus. HEENT: Atraumatic and Normocephalic Neck: supple, no JVD. Resp:Good air entry bilaterally,no rales or rhonchi CVS: S1 S2 irregular GI: Bowel sounds present, Non tender and not distended with no gaurding, rigidity or rebound. Extremities: Left BKA Neurology:  Non  focal Psychiatric: Normal judgment and insight. Normal mood. Musculoskeletal:No digital cyanosis Skin:No Rash, warm and dry Wounds:N/A  I have personally reviewed following labs and imaging studies  LABORATORY DATA: CBC: Recent Labs  Lab 07/15/18 2235 07/17/18 0335 07/18/18 0500  WBC 7.0 4.5 5.3  NEUTROABS 6.0  --   --   HGB 11.3* 10.7* 11.0*  HCT 38.1* 35.0* 37.5*  MCV 90.3 90.0 91.2  PLT 123* 98* 110*    Basic Metabolic Panel: Recent Labs  Lab 07/15/18 2235 07/17/18 0335 07/18/18 0500  NA 132* 135 136  K 4.8 4.4 4.4  CL 100 105 104  CO2 21* 23 23  GLUCOSE 297* 69* 107*  BUN 30* 31* 36*  CREATININE 1.35* 1.11 1.13  CALCIUM 8.7* 8.0* 8.0*    GFR: Estimated Creatinine Clearance: 50.8 mL/min (by C-G formula based on SCr of 1.13 mg/dL).  Liver Function Tests: Recent Labs  Lab 07/15/18 2235  AST 38  ALT 19  ALKPHOS 93  BILITOT 1.6*  PROT 6.7  ALBUMIN 3.2*   No results for input(s): LIPASE, AMYLASE in the last 168 hours. No results for input(s): AMMONIA in the last 168 hours.  Coagulation Profile: No results for input(s): INR, PROTIME in the last 168 hours.  Cardiac Enzymes: Recent Labs  Lab 07/15/18 2235  CKTOTAL 276    BNP (last 3 results) No results for input(s): PROBNP in the last 8760 hours.  HbA1C: Recent Labs    07/16/18 0855  HGBA1C 7.9*    CBG: Recent Labs  Lab 07/17/18 1215 07/17/18 1704 07/17/18 2219 07/18/18 0803 07/18/18 1336  GLUCAP 201* 94 238* 105* 117*    Lipid Profile: No results for input(s): CHOL, HDL, LDLCALC, TRIG, CHOLHDL, LDLDIRECT in the last 72 hours.  Thyroid Function Tests: No results for input(s): TSH, T4TOTAL, FREET4, T3FREE, THYROIDAB in the last 72 hours.  Anemia Panel: No results for input(s): VITAMINB12, FOLATE, FERRITIN, TIBC, IRON, RETICCTPCT in the last 72 hours.  Urine analysis:    Component Value Date/Time   COLORURINE AMBER (A) 07/16/2018 0113   APPEARANCEUR HAZY (A) 07/16/2018 0113    LABSPEC 1.023 07/16/2018 0113   PHURINE 5.0 07/16/2018 0113   GLUCOSEU >=500 (A) 07/16/2018 0113   HGBUR LARGE (A) 07/16/2018 0113   BILIRUBINUR NEGATIVE 07/16/2018 0113   KETONESUR 5 (A) 07/16/2018 0113   PROTEINUR >=300 (A) 07/16/2018 0113   NITRITE NEGATIVE 07/16/2018 0113   LEUKOCYTESUR NEGATIVE 07/16/2018 0113    Sepsis Labs: Lactic Acid, Venous No results found for: LATICACIDVEN  MICROBIOLOGY: No results found for this or any previous visit (from the past 240 hour(s)).  RADIOLOGY STUDIES/RESULTS: Ct Head Wo Contrast  Result Date: 07/16/2018 CLINICAL DATA:  Altered mental status.  Hypertension and diabetes. EXAM: CT HEAD WITHOUT CONTRAST TECHNIQUE: Contiguous axial images were obtained from the base of the skull through the vertex without intravenous contrast. COMPARISON:  None. FINDINGS: Brain: Small remote lacunar infarct of the left thalamus, image 19/3. Periventricular white matter and corona radiata hypodensities favor chronic ischemic microvascular white matter disease. Otherwise, the brainstem, cerebellum, cerebral peduncles, thalami, basal ganglia, basilar cisterns, and ventricular system appear  within normal limits. No intracranial hemorrhage, mass lesion, or acute CVA. Vascular: Atherosclerotic calcification of the vertebral arteries (especially the left) with punctate atherosclerotic calcification in the basilar artery. There is atherosclerotic calcification of the cavernous carotid arteries bilaterally. Skull: Unremarkable Sinuses/Orbits: Unremarkable Other: No supplemental non-categorized findings. IMPRESSION: 1. No acute intracranial findings. 2. Periventricular white matter and corona radiata hypodensities favor chronic ischemic microvascular white matter disease. 3. Small remote left thalamic lacunar infarct. 4. Atherosclerosis. Electronically Signed   By: Van Clines M.D.   On: 07/16/2018 03:01     LOS: 2 days   Oren Binet, MD  [facility]  If  7PM-7AM, please contact night-coverage  Please page via [email or web address]-click on MD name and type text message  07/18/2018, 1:39 PM

## 2018-07-19 DIAGNOSIS — I5041 Acute combined systolic (congestive) and diastolic (congestive) heart failure: Secondary | ICD-10-CM

## 2018-07-19 DIAGNOSIS — I10 Essential (primary) hypertension: Secondary | ICD-10-CM

## 2018-07-19 DIAGNOSIS — I480 Paroxysmal atrial fibrillation: Secondary | ICD-10-CM

## 2018-07-19 DIAGNOSIS — I5042 Chronic combined systolic (congestive) and diastolic (congestive) heart failure: Secondary | ICD-10-CM

## 2018-07-19 LAB — CBC
HEMATOCRIT: 32.6 % — AB (ref 39.0–52.0)
HEMOGLOBIN: 10 g/dL — AB (ref 13.0–17.0)
MCH: 27.8 pg (ref 26.0–34.0)
MCHC: 30.7 g/dL (ref 30.0–36.0)
MCV: 90.6 fL (ref 80.0–100.0)
Platelets: 89 10*3/uL — ABNORMAL LOW (ref 150–400)
RBC: 3.6 MIL/uL — ABNORMAL LOW (ref 4.22–5.81)
RDW: 17.2 % — ABNORMAL HIGH (ref 11.5–15.5)
WBC: 3.6 10*3/uL — AB (ref 4.0–10.5)
nRBC: 0 % (ref 0.0–0.2)

## 2018-07-19 LAB — BASIC METABOLIC PANEL
Anion gap: 6 (ref 5–15)
BUN: 28 mg/dL — ABNORMAL HIGH (ref 8–23)
CHLORIDE: 107 mmol/L (ref 98–111)
CO2: 23 mmol/L (ref 22–32)
CREATININE: 1.14 mg/dL (ref 0.61–1.24)
Calcium: 7.7 mg/dL — ABNORMAL LOW (ref 8.9–10.3)
GFR calc non Af Amer: >60 mL/min (ref >60)
GLUCOSE: 149 mg/dL — AB (ref 70–99)
Potassium: 4.5 mmol/L (ref 3.5–5.1)
Sodium: 136 mmol/L (ref 135–145)

## 2018-07-19 LAB — HEPARIN LEVEL (UNFRACTIONATED): Heparin Unfractionated: 0.27 IU/mL — ABNORMAL LOW (ref 0.30–0.70)

## 2018-07-19 LAB — GLUCOSE, CAPILLARY
GLUCOSE-CAPILLARY: 251 mg/dL — AB (ref 70–99)
GLUCOSE-CAPILLARY: 136 mg/dL — AB (ref 70–99)
Glucose-Capillary: 121 mg/dL — ABNORMAL HIGH (ref 70–99)
Glucose-Capillary: 204 mg/dL — ABNORMAL HIGH (ref 70–99)

## 2018-07-19 MED ORDER — LOSARTAN POTASSIUM 25 MG PO TABS
25.0000 mg | ORAL_TABLET | Freq: Every day | ORAL | Status: DC
  Administered 2018-07-20: 25 mg via ORAL
  Filled 2018-07-19: qty 1

## 2018-07-19 NOTE — Progress Notes (Signed)
PROGRESS NOTE        PATIENT DETAILS Name: Derek Hinton Age: 72 y.o. Sex: male Date of Birth: [birth year] Admit Date: 07/15/2018 Admitting Physician Karmen Bongo, MD FVC:[id number], Jenny Reichmann, MD  Brief Narrative: Patient is a 72 y.o. male with prior history of left BKA, DM-2, hypertension presented with confusion-he was apparently found down on the floor (was on the floor for almost 2 days)-he was also found to have new onset atrial fibrillation, and worsening leg ischemia. A 2D echocardiogram done showed EF around 15-20%.  Work-up in progress-see below for further details.    Subjective: Lying comfortably in bed.  No chest pain or shortness of breath.  Somewhat mildly confused this morning-but is easily redirected.  Assessment/Plan: Acute metabolic encephalopathy: Resolved, suspect secondary to dehydration from being on the floor for almost 2 days.    New onset atrial fibrillation: Remains in atrial fibrillation-rate controlled with metoprolol-continue IV heparin.  Once all procedures are complete-we will need to be transitioned to oral anticoagulation.   Newly diagnosed chronic systolic heart failure: Volume status is stable-do not feel patient requires diuretics at this time.  Given history of PAD, carotid stenosis-high suspicion for underlying CAD.  Cardiology following, with plans for [facility] on Monday.  Continue aspirin/Plavix/statin/beta-blocker.  DM-2: CBG stable with SSI.  Hypertension: BP controlled-continue metoprolol and ramipril.  Peripheral vascular disease-with probable critical limb ischemia: Underwent arteriogram on 10/11 which showed 95% stenosis in the right external iliac artery-patient is now status post balloon angioplasty.  Continue aspirin/Plavix/statin-await further recommendations from vascular surgery.   COPD: Stable without any exacerbation-continue bronchodilators.  Left BKA  DVT Prophylaxis: Full dose anticoagulation with  Heparin  Code Status: Full code   Family Communication: None at bedside  Disposition Plan: Remain inpatient-but will plan on Home health vs SNF on discharge  Antimicrobial agents: Anti-infectives (From admission, onward)   None     Procedures: None  CONSULTS:  vascular surgery  Time spent: 25 minutes-Greater than 50% of this time was spent in counseling, explanation of diagnosis, planning of further management, and coordination of care.  MEDICATIONS: Scheduled Meds: . aspirin EC  81 mg Oral Daily  . atorvastatin  40 mg Oral Daily  . clopidogrel  75 mg Oral Q breakfast  . docusate sodium  100 mg Oral BID  . feeding supplement (GLUCERNA SHAKE)  237 mL Oral BID BM  . insulin aspart  0-15 Units Subcutaneous TID WC  . insulin aspart  0-5 Units Subcutaneous QHS  . metoprolol tartrate  50 mg Oral BID  . mometasone-formoterol  2 puff Inhalation BID  . multivitamin with minerals  1 tablet Oral Daily  . ramipril  2.5 mg Oral Daily  . sodium chloride flush  3 mL Intravenous Q12H  . sodium chloride flush  3 mL Intravenous Q12H   Continuous Infusions: . sodium chloride Stopped (07/18/18 0700)  . sodium chloride 100 mL/hr at 07/18/18 2123  . sodium chloride    . heparin 1,000 Units/hr (07/18/18 2125)   PRN Meds:.sodium chloride, acetaminophen, albuterol, hydrALAZINE, hydrALAZINE, labetalol, morphine injection, ondansetron **OR** ondansetron (ZOFRAN) IV, sodium chloride flush, traMADol   PHYSICAL EXAM: Vital signs: Vitals:   07/18/18 2031 07/18/18 2353 07/19/18 0430 07/19/18 0926  BP: 122/63  126/79 (!) 149/94  Pulse: 66   62  Resp: 18     Temp: (!) 97.5 F (36.4 C) (!)  97.5 F (36.4 C) 98.7 F (37.1 C)   TempSrc: Oral Oral Oral   SpO2: 96%     Weight:      Height:       Filed Weights   07/16/18 1100  Weight: 62.6 kg   Body mass index is 20.38 kg/m.   General appearance:Awake, alert, not in any distress.  Eyes:no scleral icterus. HEENT: Atraumatic and  Normocephalic Neck: supple, no JVD. Resp:Good air entry bilaterally,no rales or rhonchi CVS: S1 S2 regular, no murmurs.  GI: Bowel sounds present, Non tender and not distended with no gaurding, rigidity or rebound. Extremities: Left BKA.  Right lower extremity appears unchanged. Neurology:  Non focal Psychiatric: Normal judgment and insight. Normal mood. Musculoskeletal:No digital cyanosis Skin:No Rash, warm and dry Wounds:N/A  I have personally reviewed following labs and imaging studies  LABORATORY DATA: CBC: Recent Labs  Lab 07/15/18 2235 07/17/18 0335 07/18/18 0500 07/19/18 0346  WBC 7.0 4.5 5.3 3.6*  NEUTROABS 6.0  --   --   --   HGB 11.3* 10.7* 11.0* 10.0*  HCT 38.1* 35.0* 37.5* 32.6*  MCV 90.3 90.0 91.2 90.6  PLT 123* 98* 110* 89*    Basic Metabolic Panel: Recent Labs  Lab 07/15/18 2235 07/17/18 0335 07/18/18 0500 07/19/18 0346  NA 132* 135 136 136  K 4.8 4.4 4.4 4.5  CL 100 105 104 107  CO2 21* 23 23 23   GLUCOSE 297* 69* 107* 149*  BUN 30* 31* 36* 28*  CREATININE 1.35* 1.11 1.13 1.14  CALCIUM 8.7* 8.0* 8.0* 7.7*    GFR: Estimated Creatinine Clearance: 50.3 mL/min (by C-G formula based on SCr of 1.14 mg/dL).  Liver Function Tests: Recent Labs  Lab 07/15/18 2235  AST 38  ALT 19  ALKPHOS 93  BILITOT 1.6*  PROT 6.7  ALBUMIN 3.2*   No results for input(s): LIPASE, AMYLASE in the last 168 hours. No results for input(s): AMMONIA in the last 168 hours.  Coagulation Profile: No results for input(s): INR, PROTIME in the last 168 hours.  Cardiac Enzymes: Recent Labs  Lab 07/15/18 2235  CKTOTAL 276    BNP (last 3 results) No results for input(s): PROBNP in the last 8760 hours.  HbA1C: No results for input(s): HGBA1C in the last 72 hours.  CBG: Recent Labs  Lab 07/18/18 0803 07/18/18 1336 07/18/18 1738 07/18/18 2115 07/19/18 0616  GLUCAP 105* 117* 142* 156* 121*    Lipid Profile: No results for input(s): CHOL, HDL, LDLCALC, TRIG,  CHOLHDL, LDLDIRECT in the last 72 hours.  Thyroid Function Tests: No results for input(s): TSH, T4TOTAL, FREET4, T3FREE, THYROIDAB in the last 72 hours.  Anemia Panel: No results for input(s): VITAMINB12, FOLATE, FERRITIN, TIBC, IRON, RETICCTPCT in the last 72 hours.  Urine analysis:    Component Value Date/Time   COLORURINE AMBER (A) 07/16/2018 0113   APPEARANCEUR HAZY (A) 07/16/2018 0113   LABSPEC 1.023 07/16/2018 0113   PHURINE 5.0 07/16/2018 0113   GLUCOSEU >=500 (A) 07/16/2018 0113   HGBUR LARGE (A) 07/16/2018 0113   BILIRUBINUR NEGATIVE 07/16/2018 0113   KETONESUR 5 (A) 07/16/2018 0113   PROTEINUR >=300 (A) 07/16/2018 0113   NITRITE NEGATIVE 07/16/2018 0113   LEUKOCYTESUR NEGATIVE 07/16/2018 0113    Sepsis Labs: Lactic Acid, Venous No results found for: LATICACIDVEN  MICROBIOLOGY: No results found for this or any previous visit (from the past 240 hour(s)).  RADIOLOGY STUDIES/RESULTS: Ct Head Wo Contrast  Result Date: 07/16/2018 CLINICAL DATA:  Altered mental status.  Hypertension  and diabetes. EXAM: CT HEAD WITHOUT CONTRAST TECHNIQUE: Contiguous axial images were obtained from the base of the skull through the vertex without intravenous contrast. COMPARISON:  None. FINDINGS: Brain: Small remote lacunar infarct of the left thalamus, image 19/3. Periventricular white matter and corona radiata hypodensities favor chronic ischemic microvascular white matter disease. Otherwise, the brainstem, cerebellum, cerebral peduncles, thalami, basal ganglia, basilar cisterns, and ventricular system appear within normal limits. No intracranial hemorrhage, mass lesion, or acute CVA. Vascular: Atherosclerotic calcification of the vertebral arteries (especially the left) with punctate atherosclerotic calcification in the basilar artery. There is atherosclerotic calcification of the cavernous carotid arteries bilaterally. Skull: Unremarkable Sinuses/Orbits: Unremarkable Other: No supplemental  non-categorized findings. IMPRESSION: 1. No acute intracranial findings. 2. Periventricular white matter and corona radiata hypodensities favor chronic ischemic microvascular white matter disease. 3. Small remote left thalamic lacunar infarct. 4. Atherosclerosis. Electronically Signed   By: Van Clines M.D.   On: 07/16/2018 03:01     LOS: 3 days   Oren Binet, MD  [facility]  If 7PM-7AM, please contact night-coverage  Please page via [email or web address]-click on MD name and type text message  07/19/2018, 11:29 AM

## 2018-07-19 NOTE — Progress Notes (Signed)
  Progress Note    07/19/2018 10:16 AM 1 Day Post-Op  Subjective: No overnight issues  Vitals:   07/19/18 0430 07/19/18 0926  BP: 126/79 (!) 149/94  Pulse:  62  Resp:    Temp: 98.7 F (37.1 C)   SpO2:      Physical Exam: Awake alert and oriented Nonlabored respirations Left groin is soft with mild hematoma 1+ palpable right femoral pulse Biphasic right DP and peroneal signals  CBC    Component Value Date/Time   WBC 3.6 (L) 07/19/2018 0346   RBC 3.60 (L) 07/19/2018 0346   HGB 10.0 (L) 07/19/2018 0346   HCT 32.6 (L) 07/19/2018 0346   PLT 89 (L) 07/19/2018 0346   MCV 90.6 07/19/2018 0346   MCH 27.8 07/19/2018 0346   MCHC 30.7 07/19/2018 0346   RDW 17.2 (H) 07/19/2018 0346   LYMPHSABS 0.4 (L) 07/15/2018 2235   MONOABS 0.5 07/15/2018 2235   EOSABS 0.0 07/15/2018 2235   BASOSABS 0.0 07/15/2018 2235    BMET    Component Value Date/Time   NA 136 07/19/2018 0346   K 4.5 07/19/2018 0346   CL 107 07/19/2018 0346   CO2 23 07/19/2018 0346   GLUCOSE 149 (H) 07/19/2018 0346   BUN 28 (H) 07/19/2018 0346   CREATININE 1.14 07/19/2018 0346   CALCIUM 7.7 (L) 07/19/2018 0346   GFRNONAA >60 07/19/2018 0346   GFRAA >60 07/19/2018 0346    INR No results found for: INR   Intake/Output Summary (Last 24 hours) at 07/19/2018 1016 Last data filed at 07/19/2018 0900 Gross per 24 hour  Intake 240 ml  Output -  Net 240 ml     Assessment:  72 y.o. male is s/p stenting of right external iliac artery  Plan: Continue aspirin Plavix and statin Hopefully this will be enough revascularization to help him heal his wounds and prevent amputation Available as needed over the weekend.   Copelyn Widmer C. Donzetta Matters, MD [facility] Office: [phone] Pager: [phone]  07/19/2018 10:16 AM

## 2018-07-19 NOTE — Progress Notes (Signed)
Physical Therapy Treatment Patient Details Name: Derek Hinton MRN: [id number] DOB: 06-15-1946 Today's Date: 07/19/2018    History of Present Illness Pt is a 72 y/o male admitted secondary to being found down at home for an unknown time and with acute encephalopathy. Pt also found to be in a-fib. PMH including but not limited to L transtibial amputation, PVD, HTN, DM and COPD.    PT Comments    Patient progressing towards his physical therapy goals. Performing bed mobility with moderate assistance and transfers from bed to chair with two person minimal assistance, walker, and left prosthesis for safety. Patient with seemingly improved pain control in right foot. Further distance limited by increased bleeding from IV site (RN, Juliann Pulse notified), but suspect patient will be able to progress to ambulating next session with chair follow for safety. D/c plan remains appropriate.    Follow Up Recommendations  SNF;Supervision/Assistance - 24 hour     Equipment Recommendations  None recommended by PT    Recommendations for Other Services       Precautions / Restrictions Precautions Precautions: Fall Precaution Comments: prior L transtibial amputation with prosthesis in room Restrictions Weight Bearing Restrictions: No    Mobility  Bed Mobility Overal bed mobility: Needs Assistance Bed Mobility: Supine to Sit     Supine to sit: Mod assist     General bed mobility comments: Mod assist provided at trunk to elevate  Transfers Overall transfer level: Needs assistance Equipment used: Rolling walker (2 wheeled) Transfers: Sit to/from [facility] Sit to Stand: Min assist;+2 safety/equipment Stand pivot transfers: Min assist;+2 safety/equipment       General transfer comment: MinA + 2 for safety with transition to standing from elevated bed surface and pivot from bed to chair.   Ambulation/Gait                 Stairs             Wheelchair  Mobility    Modified Rankin (Stroke Patients Only)       Balance Overall balance assessment: Needs assistance Sitting-balance support: No upper extremity supported Sitting balance-Leahy Scale: Good     Standing balance support: Bilateral upper extremity supported Standing balance-Leahy Scale: Poor                              Cognition Arousal/Alertness: Awake/alert Behavior During Therapy: WFL for tasks assessed/performed Overall Cognitive Status: Within Functional Limits for tasks assessed                                 General Comments: Pt daughter reports pt has slightly increased confusion today. Needs frequent redirection to task at hand.      Exercises      General Comments        Pertinent Vitals/Pain Pain Assessment: Faces Faces Pain Scale: Hurts a little bit Pain Location: R foot Pain Descriptors / Indicators: Sore Pain Intervention(s): Monitored during session    Home Living                      Prior Function            PT Goals (current goals can now be found in the care plan section) Acute Rehab PT Goals Patient Stated Goal: decrease pain PT Goal Formulation: With patient Time For Goal Achievement: 07/31/18 Potential to Achieve Goals:  Good Progress towards PT goals: Progressing toward goals    Frequency    Min 3X/week      PT Plan Current plan remains appropriate    Co-evaluation              AM-PAC PT "6 Clicks" Daily Activity  Outcome Measure  Difficulty turning over in bed (including adjusting bedclothes, sheets and blankets)?: A Little Difficulty moving from lying on back to sitting on the side of the bed? : Unable Difficulty sitting down on and standing up from a chair with arms (e.g., wheelchair, bedside commode, etc,.)?: Unable Help needed moving to and from a bed to chair (including a wheelchair)?: A Little Help needed walking in hospital room?: A Little Help needed climbing 3-5  steps with a railing? : Total 6 Click Score: 12    End of Session Equipment Utilized During Treatment: Gait belt, prosthesis Activity Tolerance: Patient tolerated treatment well Patient left: in chair;with call bell/phone within reach;with family/visitor present Nurse Communication: Mobility status PT Visit Diagnosis: Other abnormalities of gait and mobility (R26.89)     Time: 8251-8984 PT Time Calculation (min) (ACUTE ONLY): 28 min  Charges:  $Therapeutic Activity: 23-37 mins                     Ellamae Sia, PT, DPT Acute Rehabilitation Services Pager [phone] Office [phone]   Willy Eddy 07/19/2018, 5:18 PM

## 2018-07-19 NOTE — Progress Notes (Signed)
ANTICOAGULATION CONSULT NOTE - Follow Up Consult  Pharmacy Consult for Heparin  Indication: atrial fibrillation and possible ischemic leg  Allergies  Allergen Reactions  . Penicillins Other (See Comments)    Pt unsure, states he "didn't feel good" Patient states it made him "feel Funny"     Patient Measurements: Height: 5\' 9"  (175.3 cm) Weight: 138 lb (62.6 kg) IBW/kg (Calculated) : 70.7 Heparin Dosing Weight: 62.6 kg  Vital Signs: Temp: 98.7 F (37.1 C) (10/12 0430) Temp Source: Oral (10/12 0430) BP: 149/94 (10/12 0926) Pulse Rate: 62 (10/12 0926)  Labs: Recent Labs    07/17/18 0335 07/17/18 1136 07/18/18 0254 07/18/18 0500 07/19/18 0346  HGB 10.7*  --   --  11.0* 10.0*  HCT 35.0*  --   --  37.5* 32.6*  PLT 98*  --   --  110* 89*  HEPARINUNFRC 0.19* 0.38 0.64  --  0.27*  CREATININE 1.11  --   --  1.13 1.14    Estimated Creatinine Clearance: 50.3 mL/min (by C-G formula based on SCr of 1.14 mg/dL).  Assessment:  8 yom presented to the ED with AMS. Found to be in new onset afib and possible with an ischemic leg s/p revascularization on 10/11. Heparin held yesterday and resumed ~2230 last evening. Previously therapeutic on heparin infusion.   Morning heparin level subtherapeutic: 0.27, but level collected ~5 hours after resuming the infusion, will recheck later, no overt bleeding or issues reported  Goal of Therapy:  Heparin level 0.3-0.7 units/ml Monitor platelets by anticoagulation protocol: Yes   Plan:  Continue heparin gtt at 1000 units/hr Daily heparin level and CBC  Georga Bora, PharmD Clinical Pharmacist 07/19/2018 12:37 PM Please check AMION for all [place] numbers

## 2018-07-19 NOTE — Progress Notes (Signed)
Progress Note  Patient Name: Derek Hinton Date of Encounter: 07/19/2018  Primary Cardiologist: Sanda Klein, MD (new)  Subjective   Feeling well.  Was able to stand on his leg today.  He has noted some intermittent episodes of chest discomfort.  Inpatient Medications    Scheduled Meds: . aspirin EC  81 mg Oral Daily  . atorvastatin  40 mg Oral Daily  . clopidogrel  75 mg Oral Q breakfast  . docusate sodium  100 mg Oral BID  . feeding supplement (GLUCERNA SHAKE)  237 mL Oral BID BM  . insulin aspart  0-15 Units Subcutaneous TID WC  . insulin aspart  0-5 Units Subcutaneous QHS  . metoprolol tartrate  50 mg Oral BID  . mometasone-formoterol  2 puff Inhalation BID  . multivitamin with minerals  1 tablet Oral Daily  . ramipril  2.5 mg Oral Daily  . sodium chloride flush  3 mL Intravenous Q12H  . sodium chloride flush  3 mL Intravenous Q12H   Continuous Infusions: . sodium chloride Stopped (07/18/18 0700)  . sodium chloride    . heparin 1,000 Units/hr (07/18/18 2125)   PRN Meds: sodium chloride, acetaminophen, albuterol, hydrALAZINE, hydrALAZINE, labetalol, morphine injection, ondansetron **OR** ondansetron (ZOFRAN) IV, sodium chloride flush, traMADol   Vital Signs    Vitals:   07/18/18 2031 07/18/18 2353 07/19/18 0430 07/19/18 0926  BP: 122/63  126/79 (!) 149/94  Pulse: 66   62  Resp: 18     Temp: (!) 97.5 F (36.4 C) (!) 97.5 F (36.4 C) 98.7 F (37.1 C)   TempSrc: Oral Oral Oral   SpO2: 96%     Weight:      Height:        Intake/Output Summary (Last 24 hours) at 07/19/2018 1257 Last data filed at 07/19/2018 0900 Gross per 24 hour  Intake 240 ml  Output -  Net 240 ml   Filed Weights   07/16/18 1100  Weight: 62.6 kg    Telemetry    Atrial fibrillation.  Rate 130s at the highest.  Alternating with sinus rhythm.- Personally Reviewed  ECG    N/A- Personally Reviewed  Physical Exam   VS:  BP (!) 149/94   Pulse 62   Temp 98.7 F (37.1 C)  (Oral)   Resp 18   Ht 5\' 9"  (1.753 m)   Wt 62.6 kg   SpO2 96%   BMI 20.38 kg/m  , BMI Body mass index is 20.38 kg/m. GENERAL:  Well appearing HEENT: Pupils equal round and reactive, fundi not visualized, oral mucosa unremarkable NECK:  No jugular venous distention, waveform within normal limits, carotid upstroke brisk and symmetric, no bruits, no thyromegaly LYMPHATICS:  No cervical adenopathy LUNGS:  Clear to auscultation bilaterally HEART:  Irregularly irregular.  PMI not displaced or sustained,S1 and S2 within normal limits, no S3, no S4, no clicks, no rubs, no murmurs ABD:  Flat, positive bowel sounds normal in frequency in pitch, no bruits, no rebound, no guarding, no midline pulsatile mass, no hepatomegaly, no splenomegaly EXT:  2 plus pulses throughout, 2+ R LE edema, no cyanosis no clubbing.  L BKA SKIN:  No rashes no nodules NEURO:  Cranial nerves II through XII grossly intact, motor grossly intact throughout PSYCH:  Cognitively intact, oriented to person place and time   Labs    Chemistry Recent Labs  Lab 07/15/18 2235 07/17/18 0335 07/18/18 0500 07/19/18 0346  NA 132* 135 136 136  K 4.8 4.4 4.4 4.5  CL 100 105 104 107  CO2 21* 23 23 23   GLUCOSE 297* 69* 107* 149*  BUN 30* 31* 36* 28*  CREATININE 1.35* 1.11 1.13 1.14  CALCIUM 8.7* 8.0* 8.0* 7.7*  PROT 6.7  --   --   --   ALBUMIN 3.2*  --   --   --   AST 38  --   --   --   ALT 19  --   --   --   ALKPHOS 93  --   --   --   BILITOT 1.6*  --   --   --   GFRNONAA 50* >60 >60 >60  GFRAA 58* >60 >60 >60  ANIONGAP 11 7 9 6      Hematology Recent Labs  Lab 07/17/18 0335 07/18/18 0500 07/19/18 0346  WBC 4.5 5.3 3.6*  RBC 3.89* 4.11* 3.60*  HGB 10.7* 11.0* 10.0*  HCT 35.0* 37.5* 32.6*  MCV 90.0 91.2 90.6  MCH 27.5 26.8 27.8  MCHC 30.6 29.3* 30.7  RDW 17.0* 17.1* 17.2*  PLT 98* 110* 89*    Cardiac EnzymesNo results for input(s): TROPONINI in the last 168 hours. No results for input(s): TROPIPOC in the  last 168 hours.   BNPNo results for input(s): BNP, PROBNP in the last 168 hours.   DDimer No results for input(s): DDIMER in the last 168 hours.   Radiology    No results found.  Cardiac Studies   2D Echo 07/17/18 Study Conclusions  - Left ventricle: The cavity size was mildly dilated. Systolic function was severely reduced. The estimated ejection fraction was in the range of 15% to 20%. Diffuse hypokinesis. Features are consistent with a pseudonormal left ventricular filling pattern, with concomitant abnormal relaxation and increased filling pressure (grade 2 diastolic dysfunction). - Aortic valve: Trileaflet; mildly thickened, mildly calcified leaflets. - Mitral valve: There was mild regurgitation. - Left atrium: The atrium was severely dilated. Volume/bsa, ES (1-plane Simpson&'s, A4C): 69.5 ml/m^2. - Right atrium: The atrium was severely dilated. - Tricuspid valve: There was severe regurgitation. - Pulmonary arteries: Systolic pressure was moderately increased. PA peak pressure: 47 mm Hg (S). - Pericardium, extracardiac: There was a left pleural effusion.   Patient Profile     72 y.o. male with PVD status post left BKA, hypertension, diabetes, COPD, and prior tobacco abuse here with new onset atrial for ablation with RVR and acute systolic and diastolic heart failure.  Assessment & Plan    # Acute systolic and diastolic heart failure: LVEF 15-20% this admission which is new.   This could be both due to ischemia and tachycardia induced.  Plan for left heart catheterization on Monday.  He will also get a right heart catheterization given his pulmonary hypertension and severe TR.  He has LE edema on exam but no JVF.  We will switch ramipril to losartan with plans to switch to [facility] prior to discharge.  # New onset paroxysmal atrial fibrillation: Rates better controlled.  Continue metoprolol and consolidate to succinate given his systolic dysfunction.   His CHA2DS2 VASc score is > 2 (CHF, HTN, DM, age 67-74, vascular disease and evidence of small remote infarct on head CT).  He will need oral anticoagulants and once all procedures have been completed.  Continue heparin for now.  # PAD: # s/p L BKA: There is concern for ischemia on the right.  Arterial duplex this admission revealed 75 to 99% stenosis of the proximal to mid segment of the right SFA.  PV  angio 07/18/2018 revealed severe right iliac disease and he underwent successful angioplasty with stenting of the right external iliac artery.  Continue aspirin, clopidogrel, and atorvastatin.  # Hypertension: Blood pressure remains poorly controlled.  His goal is less than 130/80.  Given his systolic dysfunction his metoprolol will need to be consolidated to succinate.  We will switch ramipril to losartan with plans to switch to [facility] prior to discharge.  # COPD: # Prior tobacco abuse: Right heart catheterization for elevated pulmonary pressures and severe TR as above.      For questions or updates, please contact [place] Please consult [email or web address] for contact info under        Signed, Skeet Latch, MD  07/19/2018, 12:57 PM

## 2018-07-19 NOTE — Progress Notes (Signed)
Received report from RN; Introduce staff to PT and orient to room; A/O x4; VSS; IV fluids running - Hep gtt; Pain 0/10; Reviewed care plan; Bed in low position with Alarm; Call bell within reach; family visiting; Will continue to monitor.

## 2018-07-20 LAB — GLUCOSE, CAPILLARY
GLUCOSE-CAPILLARY: 85 mg/dL (ref 70–99)
GLUCOSE-CAPILLARY: 172 mg/dL — AB (ref 70–99)
GLUCOSE-CAPILLARY: 266 mg/dL — AB (ref 70–99)
GLUCOSE-CAPILLARY: 174 mg/dL — AB (ref 70–99)

## 2018-07-20 LAB — BASIC METABOLIC PANEL
ANION GAP: 9 (ref 5–15)
BUN: 31 mg/dL — ABNORMAL HIGH (ref 8–23)
CO2: 20 mmol/L — ABNORMAL LOW (ref 22–32)
Calcium: 8 mg/dL — ABNORMAL LOW (ref 8.9–10.3)
Chloride: 107 mmol/L (ref 98–111)
Creatinine, Ser: 1.15 mg/dL (ref 0.61–1.24)
GLUCOSE: 80 mg/dL (ref 70–99)
Potassium: 4.9 mmol/L (ref 3.5–5.1)
Sodium: 136 mmol/L (ref 135–145)

## 2018-07-20 LAB — CBC
HCT: 34.9 % — ABNORMAL LOW (ref 39.0–52.0)
Hemoglobin: 10.5 g/dL — ABNORMAL LOW (ref 13.0–17.0)
MCH: 27.3 pg (ref 26.0–34.0)
MCHC: 30.1 g/dL (ref 30.0–36.0)
MCV: 90.9 fL (ref 80.0–100.0)
NRBC: 0.4 % — AB (ref 0.0–0.2)
PLATELETS: 104 10*3/uL — AB (ref 150–400)
RBC: 3.84 MIL/uL — ABNORMAL LOW (ref 4.22–5.81)
RDW: 17.1 % — AB (ref 11.5–15.5)
WBC: 5.4 10*3/uL (ref 4.0–10.5)

## 2018-07-20 LAB — HEPARIN LEVEL (UNFRACTIONATED)
Heparin Unfractionated: 0.15 IU/mL — ABNORMAL LOW (ref 0.30–0.70)
Heparin Unfractionated: 0.28 IU/mL — ABNORMAL LOW (ref 0.30–0.70)

## 2018-07-20 MED ORDER — LOSARTAN POTASSIUM 50 MG PO TABS: 50.0000 mg | ORAL_TABLET | Freq: Every day | ORAL | Status: DC

## 2018-07-20 MED ORDER — METOPROLOL TARTRATE 25 MG PO TABS
25.0000 mg | ORAL_TABLET | Freq: Two times a day (BID) | ORAL | Status: DC
  Administered 2018-07-21 – 2018-07-24 (×7): 25 mg via ORAL
  Filled 2018-07-20 (×8): qty 1

## 2018-07-20 NOTE — H&P (View-Only) (Signed)
Progress Note  Patient Name: Derek Hinton Date of Encounter: 07/20/2018  Primary Cardiologist: Sanda Klein, MD (new)  Subjective   Feeling run down.  No particular complaints.  Breathing is fair.  Thinks the swelling in his leg is going down.  Inpatient Medications    Scheduled Meds: . aspirin EC  81 mg Oral Daily  . atorvastatin  40 mg Oral Daily  . clopidogrel  75 mg Oral Q breakfast  . docusate sodium  100 mg Oral BID  . feeding supplement (GLUCERNA SHAKE)  237 mL Oral BID BM  . insulin aspart  0-15 Units Subcutaneous TID WC  . insulin aspart  0-5 Units Subcutaneous QHS  . losartan  25 mg Oral Daily  . metoprolol tartrate  50 mg Oral BID  . mometasone-formoterol  2 puff Inhalation BID  . multivitamin with minerals  1 tablet Oral Daily  . sodium chloride flush  3 mL Intravenous Q12H  . sodium chloride flush  3 mL Intravenous Q12H   Continuous Infusions: . sodium chloride Stopped (07/18/18 0700)  . sodium chloride    . heparin 1,000 Units/hr (07/18/18 2125)   PRN Meds: sodium chloride, acetaminophen, albuterol, hydrALAZINE, hydrALAZINE, labetalol, morphine injection, ondansetron **OR** ondansetron (ZOFRAN) IV, sodium chloride flush, traMADol   Vital Signs    Vitals:   07/19/18 2052 07/20/18 0520 07/20/18 0833 07/20/18 0906  BP:  120/71  (!) 143/71  Pulse:    64  Resp:  14    Temp:  98 F (36.7 C)    TempSrc:  Oral    SpO2: 97% 96% 97%   Weight:      Height:        Intake/Output Summary (Last 24 hours) at 07/20/2018 1156 Last data filed at 07/20/2018 0900 Gross per 24 hour  Intake 1500 ml  Output 600 ml  Net 900 ml   Filed Weights   07/16/18 1100  Weight: 62.6 kg    Telemetry    Atrial fibrillation.  Rate 130s at the highest.  Alternating with sinus rhythm.- Personally Reviewed  ECG    N/A- Personally Reviewed  Physical Exam   VS:  BP (!) 143/71   Pulse 64   Temp 98 F (36.7 C) (Oral)   Resp 14   Ht 5\' 9"  (1.753 m)   Wt 62.6 kg    SpO2 97%   BMI 20.38 kg/m  , BMI Body mass index is 20.38 kg/m. GENERAL:  Well appearing HEENT: Pupils equal round and reactive, fundi not visualized, oral mucosa unremarkable NECK:  No jugular venous distention, waveform within normal limits, carotid upstroke brisk and symmetric, no bruits, no thyromegaly LUNGS:  Clear to auscultation bilaterally HEART:  Irregularly irregular.  PMI not displaced or sustained,S1 and S2 within normal limits, no S3, no S4, no clicks, no rubs, no murmurs ABD:  Flat, positive bowel sounds normal in frequency in pitch, no bruits, no rebound, no guarding, no midline pulsatile mass, no hepatomegaly, no splenomegaly EXT:  2 plus pulses throughout, 2+ R LE edema, no cyanosis no clubbing.  L BKA SKIN:  No rashes no nodules NEURO:  Cranial nerves II through XII grossly intact, motor grossly intact throughout PSYCH:  Cognitively intact, oriented to person place and time   Labs    Chemistry Recent Labs  Lab 07/15/18 2235  07/18/18 0500 07/19/18 0346 07/20/18 0300  NA 132*   < > 136 136 136  K 4.8   < > 4.4 4.5 4.9  CL 100   < >  104 107 107  CO2 21*   < > 23 23 20*  GLUCOSE 297*   < > 107* 149* 80  BUN 30*   < > 36* 28* 31*  CREATININE 1.35*   < > 1.13 1.14 1.15  CALCIUM 8.7*   < > 8.0* 7.7* 8.0*  PROT 6.7  --   --   --   --   ALBUMIN 3.2*  --   --   --   --   AST 38  --   --   --   --   ALT 19  --   --   --   --   ALKPHOS 93  --   --   --   --   BILITOT 1.6*  --   --   --   --   GFRNONAA 50*   < > >60 >60 >60  GFRAA 58*   < > >60 >60 >60  ANIONGAP 11   < > 9 6 9    < > = values in this interval not displayed.     Hematology Recent Labs  Lab 07/18/18 0500 07/19/18 0346 07/20/18 0300  WBC 5.3 3.6* 5.4  RBC 4.11* 3.60* 3.84*  HGB 11.0* 10.0* 10.5*  HCT 37.5* 32.6* 34.9*  MCV 91.2 90.6 90.9  MCH 26.8 27.8 27.3  MCHC 29.3* 30.7 30.1  RDW 17.1* 17.2* 17.1*  PLT 110* 89* 104*    Cardiac EnzymesNo results for input(s): TROPONINI in the last  168 hours. No results for input(s): TROPIPOC in the last 168 hours.   BNPNo results for input(s): BNP, PROBNP in the last 168 hours.   DDimer No results for input(s): DDIMER in the last 168 hours.   Radiology    No results found.  Cardiac Studies   2D Echo 07/17/18 Study Conclusions  - Left ventricle: The cavity size was mildly dilated. Systolic function was severely reduced. The estimated ejection fraction was in the range of 15% to 20%. Diffuse hypokinesis. Features are consistent with a pseudonormal left ventricular filling pattern, with concomitant abnormal relaxation and increased filling pressure (grade 2 diastolic dysfunction). - Aortic valve: Trileaflet; mildly thickened, mildly calcified leaflets. - Mitral valve: There was mild regurgitation. - Left atrium: The atrium was severely dilated. Volume/bsa, ES (1-plane Simpson&'s, A4C): 69.5 ml/m^2. - Right atrium: The atrium was severely dilated. - Tricuspid valve: There was severe regurgitation. - Pulmonary arteries: Systolic pressure was moderately increased. PA peak pressure: 47 mm Hg (S). - Pericardium, extracardiac: There was a left pleural effusion.   Patient Profile     72 y.o. male with PVD status post left BKA, hypertension, diabetes, COPD, and prior tobacco abuse here with new onset atrial for ablation with RVR and acute systolic and diastolic heart failure.  Assessment & Plan    # Acute systolic and diastolic heart failure: LVEF 15-20% this admission which is new.   This could be due to either ischemia or tachycardia induced.  Plan for left heart catheterization on Monday.  He will also get a right heart catheterization given his pulmonary hypertension and severe TR.  He has R LE edema on exam but no JVF.  Ramipril was switched to losartan with plans to start [facility] prior to discharge.  Please call daughter after cath: Amy Spaugh [phone]  # New onset paroxysmal atrial  fibrillation: Rates better controlled.  However heart rate is now in the 50s-60s and he reports general fatigue.  Will reduce metoprolol to 25mg  bid and monitor  rate. Will need to consolidate to succinate given his systolic dysfunction.  His CHA2DS2 VASc score is > 2 (CHF, HTN, DM, age 84-74, vascular disease and evidence of small remote infarct on head CT).  He will need oral anticoagulants and once all procedures have been completed.  Continue heparin for now.  # PAD: # s/p L BKA: There was concern for ischemia on the right.  Arterial duplex this admission revealed 75 to 99% stenosis of the proximal to mid segment of the right SFA.  PV angio 07/18/2018 revealed severe right iliac disease and he underwent successful angioplasty with stenting of the right external iliac artery.  Continue aspirin, clopidogrel, and atorvastatin.  # Hypertension: Blood pressure remains poorly controlled.  His goal is less than 130/80.  Given his systolic dysfunction his metoprolol will need to be consolidated to succinate.  Ramipril was switched to losartan.  Will increase to 50mg  tomorrow and start Entresto prior to discharge.   # COPD: # Prior tobacco abuse: Right heart catheterization for elevated pulmonary pressures and severe TR as above.      For questions or updates, please contact [place] Please consult [email or web address] for contact info under        Signed, Skeet Latch, MD  07/20/2018, 11:56 AM

## 2018-07-20 NOTE — Progress Notes (Addendum)
PROGRESS NOTE        PATIENT DETAILS Name: Derek Hinton Age: 72 y.o. Sex: male Date of Birth: 12/27/1945 Admit Date: 07/15/2018 Admitting Physician Karmen Bongo, MD WEX:[id number], Jenny Reichmann, MD  Brief Narrative: Patient is a 72 y.o. male with prior history of left BKA, DM-2, hypertension presented with confusion-he was apparently found down on the floor (was on the floor for almost 2 days)-he was also found to have new onset atrial fibrillation, and worsening leg ischemia. A 2D echocardiogram done showed EF around 15-20%.  Work-up in progress-see below for further details.    Subjective:  Patient in bed, appears comfortable, denies any headache, no fever, no chest pain or pressure, no shortness of breath , no abdominal pain. No focal weakness.  Assessment/Plan:  Newly diagnosed chronic combined diastolic and systolic heart failure EF 15 to 20%: Currently compensated, continue beta-blocker and ARB combination, cardiology on board due for left heart cath on 07/21/2018.    Peripheral vascular disease-with probable critical limb ischemia: Underwent arteriogram on 10/11 which showed 95% stenosis in the right external iliac artery-patient is now status post balloon angioplasty. Continue aspirin/Plavix/statin for secondary prevention, vascular surgery wants to monitor conservatively for now.  Acute metabolic encephalopathy: Resolved, suspect secondary to dehydration from being on the floor for almost 2 days.    New onset atrial fibrillation: Remains in atrial fibrillation-rate controlled with metoprolol-continue IV heparin.  Once all procedures are complete-we will need to be transitioned to oral anticoagulation.   Hypertension: BP controlled-continue metoprolol and ramipril.  COPD: Stable without any exacerbation-continue bronchodilators.  Left BKA - has prosthesis, PT and supportive care.  DM-2: CBG stable with SSI.  CBG (last 3)  Recent Labs    07/19/18 1640  07/19/18 2237 07/20/18 0747  GLUCAP 251* 136* 85   Lab Results  Component Value Date   HGBA1C 7.9 (H) 07/16/2018    DVT Prophylaxis: Full dose anticoagulation with Heparin  Code Status: Full code   Family Communication: She insists on at bedside on 07/20/2018  Disposition Plan: Remain inpatient-but will plan on Home health vs SNF on discharge  Antimicrobial agents: Anti-infectives (From admission, onward)   None     Procedures:  Arteriogram on 10/11 - which showed 95% stenosis in the right external iliac artery  TTE -  Left ventricle: The cavity size was mildly dilated. Systolic function was severely reduced. The estimated ejection fraction was in the range of 15% to 20%. Diffuse hypokinesis. Features are consistent with a pseudonormal left ventricular filling pattern, with concomitant abnormal relaxation and increased filling pressure (grade 2 diastolic dysfunction). - Aortic valve: Trileaflet; mildly thickened, mildly calcified leaflets. - Mitral valve: There was mild regurgitation. - Left atrium: The atrium was severely dilated. Volume/bsa, ES  (1-plane Simpson&'s, A4C): 69.5 ml/m^2. - Right atrium: The atrium was severely dilated. - Tricuspid valve: There was severe regurgitation. - Pulmonary arteries: Systolic pressure was moderately increased.   PA peak pressure: 47 mm Hg (S). - Pericardium, extracardiac: There was a left pleural effusion.  CONSULTS:  vascular surgery  Time spent: 25 minutes-Greater than 50% of this time was spent in counseling, explanation of diagnosis, planning of further management, and coordination of care.  MEDICATIONS: Scheduled Meds: . aspirin EC  81 mg Oral Daily  . atorvastatin  40 mg Oral Daily  . clopidogrel  75 mg Oral Q breakfast  .  docusate sodium  100 mg Oral BID  . feeding supplement (GLUCERNA SHAKE)  237 mL Oral BID BM  . insulin aspart  0-15 Units Subcutaneous TID WC  . insulin aspart  0-5 Units Subcutaneous QHS  .  losartan  25 mg Oral Daily  . metoprolol tartrate  50 mg Oral BID  . mometasone-formoterol  2 puff Inhalation BID  . multivitamin with minerals  1 tablet Oral Daily  . sodium chloride flush  3 mL Intravenous Q12H  . sodium chloride flush  3 mL Intravenous Q12H   Continuous Infusions: . sodium chloride Stopped (07/18/18 0700)  . sodium chloride    . heparin 1,000 Units/hr (07/18/18 2125)   PRN Meds:.sodium chloride, acetaminophen, albuterol, hydrALAZINE, hydrALAZINE, labetalol, morphine injection, ondansetron **OR** ondansetron (ZOFRAN) IV, sodium chloride flush, traMADol   PHYSICAL EXAM: Vital signs: Vitals:   07/19/18 1922 07/19/18 2052 07/20/18 0520 07/20/18 0833  BP: 135/82  120/71   Pulse: 92     Resp: 20  14   Temp: 97.9 F (36.6 C)  98 F (36.7 C)   TempSrc: Oral  Oral   SpO2: 99% 97% 96% 97%  Weight:      Height:       Filed Weights   07/16/18 1100  Weight: 62.6 kg   Body mass index is 20.38 kg/m.   Exam  Awake Alert, Oriented X 3, No new F.N deficits, Normal affect [place].AT,PERRAL Supple Neck,No JVD, No cervical lymphadenopathy appriciated.  Symmetrical Chest wall movement, Good air movement bilaterally, CTAB RRR,No Gallops, Rubs or new Murmurs, No Parasternal Heave +ve B.Sounds, Abd Soft, No tenderness, No organomegaly appriciated, No rebound - guarding or rigidity. No Cyanosis, Clubbing or edema,  Old L.BKA, R Leg mildly swollen and foot mildly cyanosed   I have personally reviewed following labs and imaging studies  LABORATORY DATA: CBC: Recent Labs  Lab 07/15/18 2235 07/17/18 0335 07/18/18 0500 07/19/18 0346 07/20/18 0300  WBC 7.0 4.5 5.3 3.6* 5.4  NEUTROABS 6.0  --   --   --   --   HGB 11.3* 10.7* 11.0* 10.0* 10.5*  HCT 38.1* 35.0* 37.5* 32.6* 34.9*  MCV 90.3 90.0 91.2 90.6 90.9  PLT 123* 98* 110* 89* 104*    Basic Metabolic Panel: Recent Labs  Lab 07/15/18 2235 07/17/18 0335 07/18/18 0500 07/19/18 0346 07/20/18 0300  NA 132* 135  136 136 136  K 4.8 4.4 4.4 4.5 4.9  CL 100 105 104 107 107  CO2 21* 23 23 23  20*  GLUCOSE 297* 69* 107* 149* 80  BUN 30* 31* 36* 28* 31*  CREATININE 1.35* 1.11 1.13 1.14 1.15  CALCIUM 8.7* 8.0* 8.0* 7.7* 8.0*    GFR: Estimated Creatinine Clearance: 49.9 mL/min (by C-G formula based on SCr of 1.15 mg/dL).  Liver Function Tests: Recent Labs  Lab 07/15/18 2235  AST 38  ALT 19  ALKPHOS 93  BILITOT 1.6*  PROT 6.7  ALBUMIN 3.2*   No results for input(s): LIPASE, AMYLASE in the last 168 hours. No results for input(s): AMMONIA in the last 168 hours.  Coagulation Profile: No results for input(s): INR, PROTIME in the last 168 hours.  Cardiac Enzymes: Recent Labs  Lab 07/15/18 2235  CKTOTAL 276    BNP (last 3 results) No results for input(s): PROBNP in the last 8760 hours.  HbA1C: No results for input(s): HGBA1C in the last 72 hours.  CBG: Recent Labs  Lab 07/19/18 0616 07/19/18 1144 07/19/18 1640 07/19/18 2237 07/20/18 0747  GLUCAP  121* 204* 251* 136* 85    Lipid Profile: No results for input(s): CHOL, HDL, LDLCALC, TRIG, CHOLHDL, LDLDIRECT in the last 72 hours.  Thyroid Function Tests: No results for input(s): TSH, T4TOTAL, FREET4, T3FREE, THYROIDAB in the last 72 hours.  Anemia Panel: No results for input(s): VITAMINB12, FOLATE, FERRITIN, TIBC, IRON, RETICCTPCT in the last 72 hours.  Urine analysis:    Component Value Date/Time   COLORURINE AMBER (A) 07/16/2018 0113   APPEARANCEUR HAZY (A) 07/16/2018 0113   LABSPEC 1.023 07/16/2018 0113   PHURINE 5.0 07/16/2018 0113   GLUCOSEU >=500 (A) 07/16/2018 0113   HGBUR LARGE (A) 07/16/2018 0113   BILIRUBINUR NEGATIVE 07/16/2018 0113   KETONESUR 5 (A) 07/16/2018 0113   PROTEINUR >=300 (A) 07/16/2018 0113   NITRITE NEGATIVE 07/16/2018 0113   LEUKOCYTESUR NEGATIVE 07/16/2018 0113    Sepsis Labs: Lactic Acid, Venous No results found for: LATICACIDVEN  MICROBIOLOGY: No results found for this or any  previous visit (from the past 240 hour(s)).  RADIOLOGY STUDIES/RESULTS: Ct Head Wo Contrast  Result Date: 07/16/2018 CLINICAL DATA:  Altered mental status.  Hypertension and diabetes. EXAM: CT HEAD WITHOUT CONTRAST TECHNIQUE: Contiguous axial images were obtained from the base of the skull through the vertex without intravenous contrast. COMPARISON:  None. FINDINGS: Brain: Small remote lacunar infarct of the left thalamus, image 19/3. Periventricular white matter and corona radiata hypodensities favor chronic ischemic microvascular white matter disease. Otherwise, the brainstem, cerebellum, cerebral peduncles, thalami, basal ganglia, basilar cisterns, and ventricular system appear within normal limits. No intracranial hemorrhage, mass lesion, or acute CVA. Vascular: Atherosclerotic calcification of the vertebral arteries (especially the left) with punctate atherosclerotic calcification in the basilar artery. There is atherosclerotic calcification of the cavernous carotid arteries bilaterally. Skull: Unremarkable Sinuses/Orbits: Unremarkable Other: No supplemental non-categorized findings. IMPRESSION: 1. No acute intracranial findings. 2. Periventricular white matter and corona radiata hypodensities favor chronic ischemic microvascular white matter disease. 3. Small remote left thalamic lacunar infarct. 4. Atherosclerosis. Electronically Signed   By: Van Clines M.D.   On: 07/16/2018 03:01     LOS: 4 days   Signature  Lala Lund M.D on 07/20/2018 at 8:58 AM  To page go to [email or web address] - password [email or web address]

## 2018-07-20 NOTE — Progress Notes (Signed)
Progress Note  Patient Name: Derek Hinton Date of Encounter: 07/20/2018  Primary Cardiologist: Sanda Klein, MD (new)  Subjective   Feeling run down.  No particular complaints.  Breathing is fair.  Thinks the swelling in his leg is going down.  Inpatient Medications    Scheduled Meds: . aspirin EC  81 mg Oral Daily  . atorvastatin  40 mg Oral Daily  . clopidogrel  75 mg Oral Q breakfast  . docusate sodium  100 mg Oral BID  . feeding supplement (GLUCERNA SHAKE)  237 mL Oral BID BM  . insulin aspart  0-15 Units Subcutaneous TID WC  . insulin aspart  0-5 Units Subcutaneous QHS  . losartan  25 mg Oral Daily  . metoprolol tartrate  50 mg Oral BID  . mometasone-formoterol  2 puff Inhalation BID  . multivitamin with minerals  1 tablet Oral Daily  . sodium chloride flush  3 mL Intravenous Q12H  . sodium chloride flush  3 mL Intravenous Q12H   Continuous Infusions: . sodium chloride Stopped (07/18/18 0700)  . sodium chloride    . heparin 1,000 Units/hr (07/18/18 2125)   PRN Meds: sodium chloride, acetaminophen, albuterol, hydrALAZINE, hydrALAZINE, labetalol, morphine injection, ondansetron **OR** ondansetron (ZOFRAN) IV, sodium chloride flush, traMADol   Vital Signs    Vitals:   07/19/18 2052 07/20/18 0520 07/20/18 0833 07/20/18 0906  BP:  120/71  (!) 143/71  Pulse:    64  Resp:  14    Temp:  98 F (36.7 C)    TempSrc:  Oral    SpO2: 97% 96% 97%   Weight:      Height:        Intake/Output Summary (Last 24 hours) at 07/20/2018 1156 Last data filed at 07/20/2018 0900 Gross per 24 hour  Intake 1500 ml  Output 600 ml  Net 900 ml   Filed Weights   07/16/18 1100  Weight: 62.6 kg    Telemetry    Atrial fibrillation.  Rate 130s at the highest.  Alternating with sinus rhythm.- Personally Reviewed  ECG    N/A- Personally Reviewed  Physical Exam   VS:  BP (!) 143/71   Pulse 64   Temp 98 F (36.7 C) (Oral)   Resp 14   Ht 5\' 9"  (1.753 m)   Wt 62.6 kg    SpO2 97%   BMI 20.38 kg/m  , BMI Body mass index is 20.38 kg/m. GENERAL:  Well appearing HEENT: Pupils equal round and reactive, fundi not visualized, oral mucosa unremarkable NECK:  No jugular venous distention, waveform within normal limits, carotid upstroke brisk and symmetric, no bruits, no thyromegaly LUNGS:  Clear to auscultation bilaterally HEART:  Irregularly irregular.  PMI not displaced or sustained,S1 and S2 within normal limits, no S3, no S4, no clicks, no rubs, no murmurs ABD:  Flat, positive bowel sounds normal in frequency in pitch, no bruits, no rebound, no guarding, no midline pulsatile mass, no hepatomegaly, no splenomegaly EXT:  2 plus pulses throughout, 2+ R LE edema, no cyanosis no clubbing.  L BKA SKIN:  No rashes no nodules NEURO:  Cranial nerves II through XII grossly intact, motor grossly intact throughout PSYCH:  Cognitively intact, oriented to person place and time   Labs    Chemistry Recent Labs  Lab 07/15/18 2235  07/18/18 0500 07/19/18 0346 07/20/18 0300  NA 132*   < > 136 136 136  K 4.8   < > 4.4 4.5 4.9  CL 100   < >  104 107 107  CO2 21*   < > 23 23 20*  GLUCOSE 297*   < > 107* 149* 80  BUN 30*   < > 36* 28* 31*  CREATININE 1.35*   < > 1.13 1.14 1.15  CALCIUM 8.7*   < > 8.0* 7.7* 8.0*  PROT 6.7  --   --   --   --   ALBUMIN 3.2*  --   --   --   --   AST 38  --   --   --   --   ALT 19  --   --   --   --   ALKPHOS 93  --   --   --   --   BILITOT 1.6*  --   --   --   --   GFRNONAA 50*   < > >60 >60 >60  GFRAA 58*   < > >60 >60 >60  ANIONGAP 11   < > 9 6 9    < > = values in this interval not displayed.     Hematology Recent Labs  Lab 07/18/18 0500 07/19/18 0346 07/20/18 0300  WBC 5.3 3.6* 5.4  RBC 4.11* 3.60* 3.84*  HGB 11.0* 10.0* 10.5*  HCT 37.5* 32.6* 34.9*  MCV 91.2 90.6 90.9  MCH 26.8 27.8 27.3  MCHC 29.3* 30.7 30.1  RDW 17.1* 17.2* 17.1*  PLT 110* 89* 104*    Cardiac EnzymesNo results for input(s): TROPONINI in the last  168 hours. No results for input(s): TROPIPOC in the last 168 hours.   BNPNo results for input(s): BNP, PROBNP in the last 168 hours.   DDimer No results for input(s): DDIMER in the last 168 hours.   Radiology    No results found.  Cardiac Studies   2D Echo 07/17/18 Study Conclusions  - Left ventricle: The cavity size was mildly dilated. Systolic function was severely reduced. The estimated ejection fraction was in the range of 15% to 20%. Diffuse hypokinesis. Features are consistent with a pseudonormal left ventricular filling pattern, with concomitant abnormal relaxation and increased filling pressure (grade 2 diastolic dysfunction). - Aortic valve: Trileaflet; mildly thickened, mildly calcified leaflets. - Mitral valve: There was mild regurgitation. - Left atrium: The atrium was severely dilated. Volume/bsa, ES (1-plane Simpson&'s, A4C): 69.5 ml/m^2. - Right atrium: The atrium was severely dilated. - Tricuspid valve: There was severe regurgitation. - Pulmonary arteries: Systolic pressure was moderately increased. PA peak pressure: 47 mm Hg (S). - Pericardium, extracardiac: There was a left pleural effusion.   Patient Profile     72 y.o. male with PVD status post left BKA, hypertension, diabetes, COPD, and prior tobacco abuse here with new onset atrial for ablation with RVR and acute systolic and diastolic heart failure.  Assessment & Plan    # Acute systolic and diastolic heart failure: LVEF 15-20% this admission which is new.   This could be due to either ischemia or tachycardia induced.  Plan for left heart catheterization on Monday.  He will also get a right heart catheterization given his pulmonary hypertension and severe TR.  He has R LE edema on exam but no JVF.  Ramipril was switched to losartan with plans to start [facility] prior to discharge.  Please call daughter after cath: Amy Spaugh [phone]  # New onset paroxysmal atrial  fibrillation: Rates better controlled.  However heart rate is now in the 50s-60s and he reports general fatigue.  Will reduce metoprolol to 25mg  bid and monitor  rate. Will need to consolidate to succinate given his systolic dysfunction.  His CHA2DS2 VASc score is > 2 (CHF, HTN, DM, age 59-74, vascular disease and evidence of small remote infarct on head CT).  He will need oral anticoagulants and once all procedures have been completed.  Continue heparin for now.  # PAD: # s/p L BKA: There was concern for ischemia on the right.  Arterial duplex this admission revealed 75 to 99% stenosis of the proximal to mid segment of the right SFA.  PV angio 07/18/2018 revealed severe right iliac disease and he underwent successful angioplasty with stenting of the right external iliac artery.  Continue aspirin, clopidogrel, and atorvastatin.  # Hypertension: Blood pressure remains poorly controlled.  His goal is less than 130/80.  Given his systolic dysfunction his metoprolol will need to be consolidated to succinate.  Ramipril was switched to losartan.  Will increase to 50mg  tomorrow and start Entresto prior to discharge.   # COPD: # Prior tobacco abuse: Right heart catheterization for elevated pulmonary pressures and severe TR as above.      For questions or updates, please contact [place] Please consult [email or web address] for contact info under        Signed, Skeet Latch, MD  07/20/2018, 11:56 AM

## 2018-07-21 ENCOUNTER — Encounter (HOSPITAL_COMMUNITY)
Admission: EM | Disposition: A | Discharge status: Skilled Nursing Facility | Payer: Self-pay | Source: Home / Self Care | Attending: Internal Medicine

## 2018-07-21 ENCOUNTER — Encounter (HOSPITAL_COMMUNITY): Payer: Self-pay | Admitting: Interventional Cardiology

## 2018-07-21 ENCOUNTER — Telehealth: Payer: Self-pay | Admitting: Interventional Cardiology

## 2018-07-21 DIAGNOSIS — I255 Ischemic cardiomyopathy: Secondary | ICD-10-CM

## 2018-07-21 DIAGNOSIS — L8990 Pressure ulcer of unspecified site, unspecified stage: Secondary | ICD-10-CM

## 2018-07-21 DIAGNOSIS — I2510 Atherosclerotic heart disease of native coronary artery without angina pectoris: Secondary | ICD-10-CM

## 2018-07-21 DIAGNOSIS — I4891 Unspecified atrial fibrillation: Secondary | ICD-10-CM

## 2018-07-21 HISTORY — PX: RIGHT/LEFT HEART CATH AND CORONARY ANGIOGRAPHY: CATH118266

## 2018-07-21 LAB — POCT I-STAT 3, VENOUS BLOOD GAS (G3P V)
ACID-BASE DEFICIT: 5 mmol/L — AB (ref 0.0–2.0)
Acid-base deficit: 5 mmol/L — ABNORMAL HIGH (ref 0.0–2.0)
BICARBONATE: 21 mmol/L (ref 20.0–28.0)
Bicarbonate: 20.8 mmol/L (ref 20.0–28.0)
O2 SAT: 58 %
O2 SAT: 58 %
PH VEN: 7.322 (ref 7.250–7.430)
TCO2: 22 mmol/L (ref 22–32)
TCO2: 22 mmol/L (ref 22–32)
pCO2, Ven: 40.3 mmHg — ABNORMAL LOW (ref 44.0–60.0)
pCO2, Ven: 40.6 mmHg — ABNORMAL LOW (ref 44.0–60.0)
pH, Ven: 7.32 (ref 7.250–7.430)
pO2, Ven: 32 mmHg (ref 32.0–45.0)
pO2, Ven: 33 mmHg (ref 32.0–45.0)

## 2018-07-21 LAB — BASIC METABOLIC PANEL
ANION GAP: 11 (ref 5–15)
BUN: 34 mg/dL — ABNORMAL HIGH (ref 8–23)
CHLORIDE: 110 mmol/L (ref 98–111)
CO2: 15 mmol/L — ABNORMAL LOW (ref 22–32)
Calcium: 7.9 mg/dL — ABNORMAL LOW (ref 8.9–10.3)
Creatinine, Ser: 1.1 mg/dL (ref 0.61–1.24)
GFR calc non Af Amer: >60 mL/min (ref >60)
GLUCOSE: 238 mg/dL — AB (ref 70–99)
POTASSIUM: 5.5 mmol/L — AB (ref 3.5–5.1)
Sodium: 136 mmol/L (ref 135–145)

## 2018-07-21 LAB — GLUCOSE, CAPILLARY
GLUCOSE-CAPILLARY: 248 mg/dL — AB (ref 70–99)
Glucose-Capillary: 190 mg/dL — ABNORMAL HIGH (ref 70–99)
Glucose-Capillary: 197 mg/dL — ABNORMAL HIGH (ref 70–99)
Glucose-Capillary: 181 mg/dL — ABNORMAL HIGH (ref 70–99)
Glucose-Capillary: 264 mg/dL — ABNORMAL HIGH (ref 70–99)

## 2018-07-21 LAB — POCT I-STAT 3, ART BLOOD GAS (G3+)
Acid-base deficit: 6 mmol/L — ABNORMAL HIGH (ref 0.0–2.0)
BICARBONATE: 19.2 mmol/L — AB (ref 20.0–28.0)
O2 Saturation: 90 %
PH ART: 7.333 — AB (ref 7.350–7.450)
PO2 ART: 63 mmHg — AB (ref 83.0–108.0)
TCO2: 20 mmol/L — ABNORMAL LOW (ref 22–32)
pCO2 arterial: 36.2 mmHg (ref 32.0–48.0)

## 2018-07-21 LAB — CBC
HEMATOCRIT: 41.7 % (ref 39.0–52.0)
HEMOGLOBIN: 11.4 g/dL — AB (ref 13.0–17.0)
MCH: 27 pg (ref 26.0–34.0)
MCHC: 27.3 g/dL — ABNORMAL LOW (ref 30.0–36.0)
MCV: 98.6 fL (ref 80.0–100.0)
NRBC: 0.6 % — AB (ref 0.0–0.2)
Platelets: 106 10*3/uL — ABNORMAL LOW (ref 150–400)
RBC: 4.23 MIL/uL (ref 4.22–5.81)
RDW: 17.2 % — ABNORMAL HIGH (ref 11.5–15.5)
WBC: 5.3 10*3/uL (ref 4.0–10.5)

## 2018-07-21 LAB — HEPARIN LEVEL (UNFRACTIONATED): Heparin Unfractionated: <0.1 IU/mL — ABNORMAL LOW (ref 0.30–0.70)

## 2018-07-21 LAB — POCT ACTIVATED CLOTTING TIME: Activated Clotting Time: 175 seconds

## 2018-07-21 SURGERY — RIGHT/LEFT HEART CATH AND CORONARY ANGIOGRAPHY
Anesthesia: LOCAL

## 2018-07-21 MED ORDER — SODIUM CHLORIDE 0.9% FLUSH: 3.0000 mL | INTRAVENOUS | Status: DC | PRN

## 2018-07-21 MED ORDER — MIDAZOLAM HCL 2 MG/2ML IJ SOLN
INTRAMUSCULAR | Status: AC
  Filled 2018-07-21: qty 2

## 2018-07-21 MED ORDER — HEPARIN (PORCINE) IN NACL 100-0.45 UNIT/ML-% IJ SOLN: 1150.0000 [IU]/h | INTRAMUSCULAR | Status: DC

## 2018-07-21 MED ORDER — HEPARIN (PORCINE) IN NACL 100-0.45 UNIT/ML-% IJ SOLN: 1100.0000 [IU]/h | INTRAMUSCULAR | Status: DC

## 2018-07-21 MED ORDER — MIDAZOLAM HCL 2 MG/2ML IJ SOLN
INTRAMUSCULAR | Status: DC | PRN
  Administered 2018-07-21: 1 mg via INTRAVENOUS

## 2018-07-21 MED ORDER — HEPARIN SODIUM (PORCINE) 1000 UNIT/ML IJ SOLN
INTRAMUSCULAR | Status: DC | PRN
  Administered 2018-07-21: 3500 [IU] via INTRAVENOUS

## 2018-07-21 MED ORDER — VERAPAMIL HCL 2.5 MG/ML IV SOLN
INTRAVENOUS | Status: DC | PRN
  Administered 2018-07-21: 08:00:00 via INTRA_ARTERIAL

## 2018-07-21 MED ORDER — HEPARIN SODIUM (PORCINE) 1000 UNIT/ML IJ SOLN
INTRAMUSCULAR | Status: AC
  Filled 2018-07-21: qty 1

## 2018-07-21 MED ORDER — FENTANYL CITRATE (PF) 100 MCG/2ML IJ SOLN
INTRAMUSCULAR | Status: AC
  Filled 2018-07-21: qty 2

## 2018-07-21 MED ORDER — LIDOCAINE HCL (PF) 1 % IJ SOLN
INTRAMUSCULAR | Status: DC | PRN
  Administered 2018-07-21: 2 mL
  Administered 2018-07-21: 10 mL

## 2018-07-21 MED ORDER — SODIUM CHLORIDE 0.9 % IV SOLN: INTRAVENOUS | Status: DC

## 2018-07-21 MED ORDER — SODIUM CHLORIDE 0.9% FLUSH
3.0000 mL | Freq: Two times a day (BID) | INTRAVENOUS | Status: DC
  Administered 2018-07-22 – 2018-07-23 (×2): 3 mL via INTRAVENOUS

## 2018-07-21 MED ORDER — SODIUM POLYSTYRENE SULFONATE 15 GM/60ML PO SUSP
30.0000 g | Freq: Once | ORAL | Status: AC
  Administered 2018-07-21: 30 g via ORAL
  Filled 2018-07-21: qty 120

## 2018-07-21 MED ORDER — ASPIRIN 81 MG PO CHEW
81.0000 mg | CHEWABLE_TABLET | ORAL | Status: AC
  Administered 2018-07-21: 81 mg via ORAL
  Filled 2018-07-21: qty 1

## 2018-07-21 MED ORDER — FUROSEMIDE 10 MG/ML IJ SOLN
20.0000 mg | Freq: Once | INTRAMUSCULAR | Status: AC
  Administered 2018-07-21: 20 mg via INTRAVENOUS
  Filled 2018-07-21: qty 2

## 2018-07-21 MED ORDER — VERAPAMIL HCL 2.5 MG/ML IV SOLN
INTRAVENOUS | Status: AC
  Filled 2018-07-21: qty 2

## 2018-07-21 MED ORDER — IOHEXOL 350 MG/ML SOLN
INTRAVENOUS | Status: DC | PRN
  Administered 2018-07-21: 55 mL via INTRACARDIAC

## 2018-07-21 MED ORDER — ACETAMINOPHEN 325 MG PO TABS: 650.0000 mg | ORAL_TABLET | ORAL | Status: DC | PRN

## 2018-07-21 MED ORDER — FENTANYL CITRATE (PF) 100 MCG/2ML IJ SOLN
INTRAMUSCULAR | Status: DC | PRN
  Administered 2018-07-21: 25 ug via INTRAVENOUS

## 2018-07-21 MED ORDER — HEPARIN (PORCINE) IN NACL 1000-0.9 UT/500ML-% IV SOLN
INTRAVENOUS | Status: DC | PRN
  Administered 2018-07-21 (×2): 500 mL

## 2018-07-21 MED ORDER — SODIUM CHLORIDE 0.9 % IV SOLN: 250.0000 mL | INTRAVENOUS | Status: DC | PRN

## 2018-07-21 MED ORDER — ONDANSETRON HCL 4 MG/2ML IJ SOLN: 4.0000 mg | Freq: Four times a day (QID) | INTRAMUSCULAR | Status: DC | PRN

## 2018-07-21 MED ORDER — LIDOCAINE HCL (PF) 1 % IJ SOLN
INTRAMUSCULAR | Status: AC
  Filled 2018-07-21: qty 30

## 2018-07-21 MED ORDER — HEPARIN (PORCINE) IN NACL 1000-0.9 UT/500ML-% IV SOLN
INTRAVENOUS | Status: AC
  Filled 2018-07-21: qty 500

## 2018-07-21 MED ORDER — SODIUM CHLORIDE 0.9% FLUSH: 3.0000 mL | Freq: Two times a day (BID) | INTRAVENOUS | Status: DC

## 2018-07-21 MED FILL — Lidocaine HCl Local Preservative Free (PF) Inj 1%: INTRAMUSCULAR | Qty: 30 | Status: AC

## 2018-07-21 SURGICAL SUPPLY — 11 items
CATH 5FR JL3.5 JR4 ANG PIG MP (CATHETERS) ×2 IMPLANT
CATH SWAN GANZ 7F STRAIGHT (CATHETERS) ×2 IMPLANT
DEVICE RAD COMP TR BAND LRG (VASCULAR PRODUCTS) ×2 IMPLANT
GLIDESHEATH SLEND SS 6F .021 (SHEATH) ×2 IMPLANT
GUIDEWIRE INQWIRE 1.5J.035X260 (WIRE) ×1 IMPLANT
INQWIRE 1.5J .035X260CM (WIRE) ×2
KIT HEART LEFT (KITS) ×2 IMPLANT
PACK CARDIAC CATHETERIZATION (CUSTOM PROCEDURE TRAY) ×2 IMPLANT
SHEATH PINNACLE 7F 10CM (SHEATH) ×2 IMPLANT
TRANSDUCER W/STOPCOCK (MISCELLANEOUS) ×2 IMPLANT
TUBING CIL FLEX 10 FLL-RA (TUBING) ×2 IMPLANT

## 2018-07-21 NOTE — Progress Notes (Signed)
CSW continuing to follow. Pending medical readiness, will support with discharge to SNF.  Estanislado Emms, [place]

## 2018-07-21 NOTE — Progress Notes (Signed)
PROGRESS NOTE        PATIENT DETAILS Name: Derek Hinton Age: 72 y.o. Sex: male Date of Birth: January 30, 1946 Admit Date: 07/15/2018 Admitting Physician Karmen Bongo, MD HCW:[id number], Jenny Reichmann, MD  Brief Narrative: Patient is a 72 y.o. male with prior history of left BKA, DM-2, hypertension presented with confusion-he was apparently found down on the floor (was on the floor for almost 2 days)-he was also found to have new onset atrial fibrillation, and worsening leg ischemia. A 2D echocardiogram done showed EF around 15-20%.  Work-up in progress-see below for further details.    Subjective:  Patient in bed, appears comfortable, denies any headache, no fever, no chest pain or pressure, no shortness of breath , no abdominal pain. No focal weakness.   Assessment/Plan:  Newly diagnosed chronic combined diastolic and systolic heart failure EF 15 to 20%: Currently compensated, continue beta-blocker hold ARB on 07/21/2018 due to hyperkalemia, seen by cardiology underwent left heart catheterization showing severe triple-vessel disease, continues to be on heparin drip, Plavix on hold pending cardiothoracic surgery evaluation, monitor clinically.    Peripheral vascular disease-with probable critical limb ischemia: Underwent arteriogram on 10/11 which showed 95% stenosis in the right external iliac artery-patient is now status post balloon angioplasty. Continue aspirin/Plavix (on hold as above pending cardiothoracic surgery evaluation) statin for secondary prevention, vascular surgery wants to monitor conservatively for now.  Acute metabolic encephalopathy: Resolved, suspect secondary to dehydration from being on the floor for almost 2 days.    New onset atrial fibrillation: Remains in atrial fibrillation-rate controlled with metoprolol-continue IV heparin.  Once all procedures are complete-we will need to be transitioned to oral anticoagulation.   Hypertension: BP  controlled-continue metoprolol, holding ACE/ARB due to hyperkalemia.  COPD: Stable without any exacerbation-continue bronchodilators.  Left BKA - has prosthesis, PT and supportive care.  Require SNF likely discharge in 1 to 2 days pending on cardiothoracic surgery evaluation.  Mild to moderate hyperkalemia.  Hold ARB, gentle Lasix and Kayexalate.  Repeat BMP on 07/22/2018.    DM-2: CBG stable with SSI.  CBG (last 3)  Recent Labs    07/21/18 0623 07/21/18 0907 07/21/18 1100  GLUCAP 190* 197* 181*   Lab Results  Component Value Date   HGBA1C 7.9 (H) 07/16/2018    DVT Prophylaxis: Full dose anticoagulation with Heparin  Code Status: Full code   Family Communication: She insists on at bedside on 07/20/2018  Disposition Plan: Remain inpatient-but will plan on Home health vs SNF on discharge  Antimicrobial agents: Anti-infectives (From admission, onward)   None     Procedures:  Left heart catheterization on 07/21/2018  -  Severe three vessel disease in the setting of low EF.  Plan for cardiac surgery consult.  If he is not a candidate for CABG, would have to consider medical therapy vs. Atherectomy of LAD.  Plavix on hold currently in the event of cardiac surgery.  Would need to restart depending on plan of treatment.  Restart heparin in 8 hours post sheath pull.  Aarteriogram on 10/11 - which showed 95% stenosis in the right external iliac artery  TTE -  Left ventricle: The cavity size was mildly dilated. Systolic function was severely reduced. The estimated ejection fraction was in the range of 15% to 20%. Diffuse hypokinesis. Features are consistent with a pseudonormal left ventricular filling pattern, with concomitant abnormal relaxation and  increased filling pressure (grade 2 diastolic dysfunction). - Aortic valve: Trileaflet; mildly thickened, mildly calcified leaflets. - Mitral valve: There was mild regurgitation. - Left atrium: The atrium was severely dilated.  Volume/bsa, ES  (1-plane Simpson&'s, A4C): 69.5 ml/m^2. - Right atrium: The atrium was severely dilated. - Tricuspid valve: There was severe regurgitation. - Pulmonary arteries: Systolic pressure was moderately increased.   PA peak pressure: 47 mm Hg (S). - Pericardium, extracardiac: There was a left pleural effusion.  CONSULTS:  vascular surgery  Time spent: 25 minutes-Greater than 50% of this time was spent in counseling, explanation of diagnosis, planning of further management, and coordination of care.  MEDICATIONS: Scheduled Meds: . aspirin EC  81 mg Oral Daily  . atorvastatin  40 mg Oral Daily  . docusate sodium  100 mg Oral BID  . feeding supplement (GLUCERNA SHAKE)  237 mL Oral BID BM  . insulin aspart  0-15 Units Subcutaneous TID WC  . insulin aspart  0-5 Units Subcutaneous QHS  . metoprolol tartrate  25 mg Oral BID  . mometasone-formoterol  2 puff Inhalation BID  . multivitamin with minerals  1 tablet Oral Daily  . sodium chloride flush  3 mL Intravenous Q12H  . sodium chloride flush  3 mL Intravenous Q12H  . sodium chloride flush  3 mL Intravenous Q12H   Continuous Infusions: . sodium chloride Stopped (07/18/18 0700)  . sodium chloride    . sodium chloride    . heparin     PRN Meds:.sodium chloride, sodium chloride, acetaminophen, acetaminophen, albuterol, hydrALAZINE, hydrALAZINE, labetalol, morphine injection, ondansetron **OR** ondansetron (ZOFRAN) IV, ondansetron (ZOFRAN) IV, sodium chloride flush, sodium chloride flush, traMADol   PHYSICAL EXAM: Vital signs: Vitals:   07/21/18 0907 07/21/18 0922 07/21/18 0924 07/21/18 1100  BP: (!) 177/90 (!) 170/82 (!) 178/76 (!) 171/89  Pulse: 65 (!) 43 69 79  Resp: 15 16 13 18   Temp:    (!) 97.5 F (36.4 C)  TempSrc:    Oral  SpO2: 98% 96% 95% 99%  Weight:      Height:       Filed Weights   07/16/18 1100 07/21/18 0407  Weight: 62.6 kg 62.3 kg   Body mass index is 20.28 kg/m.   Exam  Awake Alert, Oriented  X 3, No new F.N deficits, Normal affect [place].AT,PERRAL Supple Neck,No JVD, No cervical lymphadenopathy appriciated.  Symmetrical Chest wall movement, Good air movement bilaterally, CTAB RRR,No Gallops, Rubs or new Murmurs, No Parasternal Heave +ve B.Sounds, Abd Soft, No tenderness, No organomegaly appriciated, No rebound - guarding or rigidity. No Cyanosis, Clubbing or edema, Old L.BKA, R Leg mildly swollen and foot mildly cyanosed   I have personally reviewed following labs and imaging studies  LABORATORY DATA: CBC: Recent Labs  Lab 07/15/18 2235 07/17/18 0335 07/18/18 0500 07/19/18 0346 07/20/18 0300 07/21/18 0311  WBC 7.0 4.5 5.3 3.6* 5.4 5.3  NEUTROABS 6.0  --   --   --   --   --   HGB 11.3* 10.7* 11.0* 10.0* 10.5* 11.4*  HCT 38.1* 35.0* 37.5* 32.6* 34.9* 41.7  MCV 90.3 90.0 91.2 90.6 90.9 98.6  PLT 123* 98* 110* 89* 104* 106*    Basic Metabolic Panel: Recent Labs  Lab 07/17/18 0335 07/18/18 0500 07/19/18 0346 07/20/18 0300 07/21/18 0311  NA 135 136 136 136 136  K 4.4 4.4 4.5 4.9 5.5*  CL 105 104 107 107 110  CO2 23 23 23  20* 15*  GLUCOSE 69* 107* 149* 80 238*  BUN 31* 36* 28* 31* 34*  CREATININE 1.11 1.13 1.14 1.15 1.10  CALCIUM 8.0* 8.0* 7.7* 8.0* 7.9*    GFR: Estimated Creatinine Clearance: 51.9 mL/min (by C-G formula based on SCr of 1.1 mg/dL).  Liver Function Tests: Recent Labs  Lab 07/15/18 2235  AST 38  ALT 19  ALKPHOS 93  BILITOT 1.6*  PROT 6.7  ALBUMIN 3.2*   No results for input(s): LIPASE, AMYLASE in the last 168 hours. No results for input(s): AMMONIA in the last 168 hours.  Coagulation Profile: No results for input(s): INR, PROTIME in the last 168 hours.  Cardiac Enzymes: Recent Labs  Lab 07/15/18 2235  CKTOTAL 276    BNP (last 3 results) No results for input(s): PROBNP in the last 8760 hours.  HbA1C: No results for input(s): HGBA1C in the last 72 hours.  CBG: Recent Labs  Lab 07/20/18 1641 07/20/18 2135 07/21/18 0623  07/21/18 0907 07/21/18 1100  GLUCAP 266* 174* 190* 197* 181*    Lipid Profile: No results for input(s): CHOL, HDL, LDLCALC, TRIG, CHOLHDL, LDLDIRECT in the last 72 hours.  Thyroid Function Tests: No results for input(s): TSH, T4TOTAL, FREET4, T3FREE, THYROIDAB in the last 72 hours.  Anemia Panel: No results for input(s): VITAMINB12, FOLATE, FERRITIN, TIBC, IRON, RETICCTPCT in the last 72 hours.  Urine analysis:    Component Value Date/Time   COLORURINE AMBER (A) 07/16/2018 0113   APPEARANCEUR HAZY (A) 07/16/2018 0113   LABSPEC 1.023 07/16/2018 0113   PHURINE 5.0 07/16/2018 0113   GLUCOSEU >=500 (A) 07/16/2018 0113   HGBUR LARGE (A) 07/16/2018 0113   BILIRUBINUR NEGATIVE 07/16/2018 0113   KETONESUR 5 (A) 07/16/2018 0113   PROTEINUR >=300 (A) 07/16/2018 0113   NITRITE NEGATIVE 07/16/2018 0113   LEUKOCYTESUR NEGATIVE 07/16/2018 0113    Sepsis Labs: Lactic Acid, Venous No results found for: LATICACIDVEN  MICROBIOLOGY: No results found for this or any previous visit (from the past 240 hour(s)).  RADIOLOGY STUDIES/RESULTS: Ct Head Wo Contrast  Result Date: 07/16/2018 CLINICAL DATA:  Altered mental status.  Hypertension and diabetes. EXAM: CT HEAD WITHOUT CONTRAST TECHNIQUE: Contiguous axial images were obtained from the base of the skull through the vertex without intravenous contrast. COMPARISON:  None. FINDINGS: Brain: Small remote lacunar infarct of the left thalamus, image 19/3. Periventricular white matter and corona radiata hypodensities favor chronic ischemic microvascular white matter disease. Otherwise, the brainstem, cerebellum, cerebral peduncles, thalami, basal ganglia, basilar cisterns, and ventricular system appear within normal limits. No intracranial hemorrhage, mass lesion, or acute CVA. Vascular: Atherosclerotic calcification of the vertebral arteries (especially the left) with punctate atherosclerotic calcification in the basilar artery. There is atherosclerotic  calcification of the cavernous carotid arteries bilaterally. Skull: Unremarkable Sinuses/Orbits: Unremarkable Other: No supplemental non-categorized findings. IMPRESSION: 1. No acute intracranial findings. 2. Periventricular white matter and corona radiata hypodensities favor chronic ischemic microvascular white matter disease. 3. Small remote left thalamic lacunar infarct. 4. Atherosclerosis. Electronically Signed   By: Van Clines M.D.   On: 07/16/2018 03:01     LOS: 5 days   Signature  Lala Lund M.D on 07/21/2018 at 11:25 AM  To page go to [email or web address] - password [facility]

## 2018-07-21 NOTE — Progress Notes (Signed)
PT Cancellation Note  Patient Details Name: Derek Hinton MRN: [id number] DOB: 1946/01/03   Cancelled Treatment:     Patient at a procedure/unavailable;   Will continue to follow,   Roney Marion, PT  Acute Rehabilitation Services Pager [phone] Office [place] 07/21/2018, 8:03 AM

## 2018-07-21 NOTE — Progress Notes (Signed)
    Had a long discussion with the patient and his son and daughter in law regarding next steps in his care.  He was turned down for CABG.  He has severe peripheral vascular disease and calcific CAD.  PCI would require atherectomy and he would be at increased of adverse events given his low EF.  He is not a good candidate for a support device due to his PVD.  Of note, he denies any chest pain.  He feels tired, and generally unwell.  He does not report angina.    He has had a left AKA and his right foot is swollen and red.  He has a known right SFA stenosis.  Right external iliac stent was noted on fluoroscopy today.    We discussed medical therapy vs. High risk PCI.  Given his lack of sx and comorbidities, we agreed on medical therapy.  I spoke with the son privately who had questions about disposition.  He asked about his father's life expectency.  I explained that this cannot be predicted but we can try to initiate meds to strengthen his heart.  Family and patient are in agreement with the plan.  He does not want any invasive procedures.  He is hoping to be discharged on Thursday.  Jettie Booze, MD

## 2018-07-21 NOTE — Telephone Encounter (Signed)
Returned call to number on file. Patient answered. Patient is currently in the hospital and has never been seen in the clinic. Patient just had cath done by Dr. Irish Lack and is pending cardiac surgery consult. Made patient aware that his daughter called and left a message for me to call back. Patient gives verbal consent to speak to his son or daughter should they call back. Made patient aware that his daughter should reach out to the RN/providers at the hospital regarding any questions at this time. Made him aware that they would be able to answer any questions better than I would since he is currently in the hospital. Patient verbalized understanding and thanked me for the call.

## 2018-07-21 NOTE — Progress Notes (Signed)
Day of Surgery Procedure(s) (LRB): RIGHT/LEFT HEART CATH AND CORONARY ANGIOGRAPHY (N/A) Subjective: Patient examined, images of coronary arteriograms and 2D echocardiogram personally reviewed and counseled with patient  72 year old diabetic smoker with history of left BKA, A. fib, and heart failure and pulmonary hypertension and severe TR   admitted with increased symptoms of heart failure including edema shortness of breath fatigue and poor appetite.  Echocardiogram shows EF 10-15%.  Biventricular dysfunction and dilatation.  Cardiac catheterization demonstrates high-grade LAD and RCA stenosis  PA pressures are elevated at 55/20 mmHg, cardiac index is compensated 2.5  Patient would be a very poor candidate for CABG with progressive symptoms of heart failure and severe biventricular dysfunction with pulmonary hypertension and severe TR.  Would recommend PCI versus medical therapy. Objective: Vital signs in last 24 hours: Temp:  [97.3 F (36.3 C)-98 F (36.7 C)] 97.5 F (36.4 C) (10/14 1100) Pulse Rate:  [42-116] 116 (10/14 1400) Cardiac Rhythm: Atrial fibrillation (10/14 0859) Resp:  [10-25] 17 (10/14 1400) BP: (129-182)/(62-99) 145/80 (10/14 1400) SpO2:  [91 %-100 %] 91 % (10/14 1400) Weight:  [62.3 kg] 62.3 kg (10/14 0407)  Hemodynamic parameters for last 24 hours:  Atrial fibrillation  Intake/Output from previous day: 10/13 0701 - 10/14 0700 In: 1080 [P.O.:1080] Out: 750 [Urine:750] Intake/Output this shift: No intake/output data recorded.       Exam    General-elderly chronically ill-appearing male lying in bed with 2 pillows    Neck- no JVD, no cervical adenopathy palpable, no carotid bruit   Lungs-scattered rhonchi   Cor-regular heart rate, 2/6 murmur of TR   Abdomen- soft, non-tender   Extremities - warm, left BKA, bilateral edema   Neuro- oriented, appropriate, no focal weakness   Lab Results: Recent Labs    07/20/18 0300 07/21/18 0311  WBC 5.4 5.3  HGB 10.5*  11.4*  HCT 34.9* 41.7  PLT 104* 106*   BMET:  Recent Labs    07/20/18 0300 07/21/18 0311  NA 136 136  K 4.9 5.5*  CL 107 110  CO2 20* 15*  GLUCOSE 80 238*  BUN 31* 34*  CREATININE 1.15 1.10  CALCIUM 8.0* 7.9*    PT/INR: No results for input(s): LABPROT, INR in the last 72 hours. ABG No results found for: PHART, HCO3, TCO2, ACIDBASEDEF, O2SAT CBG (last 3)  Recent Labs    07/21/18 0907 07/21/18 1100 07/21/18 1711  GLUCAP 197* 181* 248*    Assessment/Plan: S/P Procedure(s) (LRB): RIGHT/LEFT HEART CATH AND CORONARY ANGIOGRAPHY (N/A) 72 year old diabetic with severe ischemic cardiomyopathy and biventricular dysfunction, pulmonary hypertension, previous BKA now with increasing symptoms of heart failure.  Patient would not benefit from CABG and is not a candidate for surgery.  He understands the situation and that CABG would not provide him benefit.   LOS: 5 days    Derek Hinton 07/21/2018

## 2018-07-21 NOTE — Telephone Encounter (Signed)
New Message:       Patient daughter is requesting a call back

## 2018-07-21 NOTE — Progress Notes (Signed)
Patient returned from cath lab w R groin with clean, dry dressing with, level 0, RLE pulses observable w doppler. R radial site w TR band @ 12 per report and sanguinous oozing noted under band but no overt bleeding. Telemetry applied and CCMD notified. Instructed patient on 4 hour bedrest and keeping R leg straight, 30 degree only HOB and R arm elevated and still w TR band in place.He verbalizes understanding.

## 2018-07-21 NOTE — Progress Notes (Signed)
Site area: rt groin fv sheath Site Prior to Removal:  Level 0 Pressure Applied For:  15 minutes Manual:   yes Patient Status During Pull:  stable Post Pull Site:  Level 0 Post Pull Instructions Given:  yes Post Pull Pulses Present: rt dp dopplered Dressing Applied:  Gauze and tegaderm Bedrest begins @ 0312 Comments:

## 2018-07-21 NOTE — Progress Notes (Signed)
Progress Note  Patient Name: Derek Hinton Date of Encounter: 07/21/2018  Primary Cardiologist: Sanda Klein, MD    Subjective   72 year old gentleman with a history of hypertension, diabetes mellitus, left BKA who was admitted with heart failure.  Heart catheterization revealed severe two-vessel coronary artery disease.  He is been referred to TCT S for consideration for coronary artery bypass grafting. Comfortable today after heart catheterization.  Inpatient Medications    Scheduled Meds: . aspirin EC  81 mg Oral Daily  . atorvastatin  40 mg Oral Daily  . docusate sodium  100 mg Oral BID  . feeding supplement (GLUCERNA SHAKE)  237 mL Oral BID BM  . insulin aspart  0-15 Units Subcutaneous TID WC  . insulin aspart  0-5 Units Subcutaneous QHS  . metoprolol tartrate  25 mg Oral BID  . mometasone-formoterol  2 puff Inhalation BID  . multivitamin with minerals  1 tablet Oral Daily  . sodium chloride flush  3 mL Intravenous Q12H  . sodium chloride flush  3 mL Intravenous Q12H  . sodium chloride flush  3 mL Intravenous Q12H   Continuous Infusions: . sodium chloride Stopped (07/18/18 0700)  . sodium chloride    . sodium chloride    . heparin     PRN Meds: sodium chloride, sodium chloride, acetaminophen, acetaminophen, albuterol, hydrALAZINE, hydrALAZINE, labetalol, morphine injection, ondansetron **OR** ondansetron (ZOFRAN) IV, ondansetron (ZOFRAN) IV, sodium chloride flush, sodium chloride flush, traMADol   Vital Signs    Vitals:   07/21/18 0922 07/21/18 0924 07/21/18 1100 07/21/18 1155  BP: (!) 170/82 (!) 178/76 (!) 171/89   Pulse: (!) 43 69 79   Resp: 16 13 18    Temp:   (!) 97.5 F (36.4 C)   TempSrc:   Oral   SpO2: 96% 95% 99% 96%  Weight:      Height:        Intake/Output Summary (Last 24 hours) at 07/21/2018 1358 Last data filed at 07/21/2018 0149 Gross per 24 hour  Intake 240 ml  Output 750 ml  Net -510 ml   Filed Weights   07/16/18 1100 07/21/18  0407  Weight: 62.6 kg 62.3 kg    Telemetry     NSR  - Personally Reviewed  ECG     NSR  - Personally Reviewed  Physical Exam   GEN: No acute distress.   Neck: No JVD Cardiac: RRR, no murmurs, rubs, or gallops.  Respiratory: Clear to auscultation bilaterally. GI: Soft, nontender, non-distended  MS:  left BKA,  Right lower leg at 1-2 + edema and is quite red .  TR band is in place right radial cath site  Neuro:  Nonfocal  Psych: Normal affect   Labs    Chemistry Recent Labs  Lab 07/15/18 2235  07/19/18 0346 07/20/18 0300 07/21/18 0311  NA 132*   < > 136 136 136  K 4.8   < > 4.5 4.9 5.5*  CL 100   < > 107 107 110  CO2 21*   < > 23 20* 15*  GLUCOSE 297*   < > 149* 80 238*  BUN 30*   < > 28* 31* 34*  CREATININE 1.35*   < > 1.14 1.15 1.10  CALCIUM 8.7*   < > 7.7* 8.0* 7.9*  PROT 6.7  --   --   --   --   ALBUMIN 3.2*  --   --   --   --   AST 38  --   --   --   --  ALT 19  --   --   --   --   ALKPHOS 93  --   --   --   --   BILITOT 1.6*  --   --   --   --   GFRNONAA 50*   < > >60 >60 >60  GFRAA 58*   < > >60 >60 >60  ANIONGAP 11   < > 6 9 11    < > = values in this interval not displayed.     Hematology Recent Labs  Lab 07/19/18 0346 07/20/18 0300 07/21/18 0311  WBC 3.6* 5.4 5.3  RBC 3.60* 3.84* 4.23  HGB 10.0* 10.5* 11.4*  HCT 32.6* 34.9* 41.7  MCV 90.6 90.9 98.6  MCH 27.8 27.3 27.0  MCHC 30.7 30.1 27.3*  RDW 17.2* 17.1* 17.2*  PLT 89* 104* 106*    Cardiac EnzymesNo results for input(s): TROPONINI in the last 168 hours. No results for input(s): TROPIPOC in the last 168 hours.   BNPNo results for input(s): BNP, PROBNP in the last 168 hours.   DDimer No results for input(s): DDIMER in the last 168 hours.   Radiology    No results found.  Cardiac Studies     Patient Profile     72 y.o. male with congestive heart failure, now found to have severe three-vessel coronary artery disease.  Assessment & Plan      1.  Coronary artery disease:  The patient has severe three-vessel coronary artery disease.  TCTS has been consulted.  He is not having any angina.  2.  Acute on chronic combined systolic and diastolic congestive heart failure: He needs coronary artery bypass grafting. Continue Lasix, metoprolol, hydralazine        For questions or updates, please contact [place] Please consult [email or web address] for contact info under        Signed, Mertie Moores, MD  07/21/2018, 1:58 PM

## 2018-07-21 NOTE — Interval H&P Note (Signed)
Cath Lab Visit (complete for each Cath Lab visit)  Clinical Evaluation Leading to the Procedure:   ACS: Yes.    Non-ACS:    Anginal Classification: CCS IV  Anti-ischemic medical therapy: Minimal Therapy (1 class of medications)  Non-Invasive Test Results: High-risk stress test findings: cardiac mortality >3%/year EF very low  Prior CABG: No previous CABG      History and Physical Interval Note:  07/21/2018 7:32 AM  Derek Hinton  has presented today for surgery, with the diagnosis of Heart Failure  The various methods of treatment have been discussed with the patient and family. After consideration of risks, benefits and other options for treatment, the patient has consented to  Procedure(s): LEFT HEART CATH AND CORONARY ANGIOGRAPHY (N/A) as a surgical intervention .  The patient's history has been reviewed, patient examined, no change in status, stable for surgery.  I have reviewed the patient's chart and labs.  Questions were answered to the patient's satisfaction.     Derek Hinton

## 2018-07-21 NOTE — Progress Notes (Signed)
ANTICOAGULATION CONSULT NOTE - Follow Up Consult  Pharmacy Consult for Heparin  Indication: atrial fibrillation and possible ischemic leg  Allergies  Allergen Reactions  . Penicillins Other (See Comments)    Pt unsure, states he "didn't feel good" Patient states it made him "feel Funny"     Patient Measurements: Height: 5\' 9"  (175.3 cm) Weight: 137 lb 5.6 oz (62.3 kg) IBW/kg (Calculated) : 70.7 Heparin Dosing Weight: 62.6 kg  Vital Signs: Temp: 97.6 F (36.4 C) (10/14 0407) Temp Source: Oral (10/14 0407) BP: 178/76 (10/14 0924) Pulse Rate: 69 (10/14 0924)  Labs: Recent Labs    07/19/18 0346 07/20/18 0300 07/20/18 1116 07/21/18 0311  HGB 10.0* 10.5*  --  11.4*  HCT 32.6* 34.9*  --  41.7  PLT 89* 104*  --  106*  HEPARINUNFRC 0.27* 0.15* 0.28* <0.10*  CREATININE 1.14 1.15  --  1.10    Estimated Creatinine Clearance: 51.9 mL/min (by C-G formula based on SCr of 1.1 mg/dL).  Assessment:  47 yom presented to the ED with AMS. Found to be in new onset afib and possible with an ischemic leg s/p revascularization on 10/11. Heparin held yesterday and resumed ~2230 last evening. Previously therapeutic on heparin infusion.  He is s/p cardiac cath this morning with plans to resume anticoagulation 8 hours after sheath is pulled with Heparin for now.  Plans to switch to oral therapy as noted.  Goal of Therapy:  Heparin level 0.3-0.7 units/ml Monitor platelets by anticoagulation protocol: Yes   Plan:  Restart IV heparin at 1150 units/hr 8 hours after sheath removed Daily heparin level and CBC  Rober Minion, PharmD., MS Clinical Pharmacist Pager:  [phone] Thank you for allowing pharmacy to be part of this patients care team. 07/21/2018 10:53 AM Please check AMION for all [place] numbers

## 2018-07-22 DIAGNOSIS — R4182 Altered mental status, unspecified: Secondary | ICD-10-CM

## 2018-07-22 DIAGNOSIS — R531 Weakness: Secondary | ICD-10-CM

## 2018-07-22 DIAGNOSIS — J418 Mixed simple and mucopurulent chronic bronchitis: Secondary | ICD-10-CM

## 2018-07-22 DIAGNOSIS — E440 Moderate protein-calorie malnutrition: Secondary | ICD-10-CM

## 2018-07-22 LAB — CBC
HCT: 32.6 % — ABNORMAL LOW (ref 39.0–52.0)
Hemoglobin: 10 g/dL — ABNORMAL LOW (ref 13.0–17.0)
MCH: 27.3 pg (ref 26.0–34.0)
MCHC: 30.7 g/dL (ref 30.0–36.0)
MCV: 89.1 fL (ref 80.0–100.0)
NRBC: 0 % (ref 0.0–0.2)
PLATELETS: 117 10*3/uL — AB (ref 150–400)
RBC: 3.66 MIL/uL — AB (ref 4.22–5.81)
RDW: 16.9 % — ABNORMAL HIGH (ref 11.5–15.5)
WBC: 4.3 10*3/uL (ref 4.0–10.5)

## 2018-07-22 LAB — BASIC METABOLIC PANEL
ANION GAP: 10 (ref 5–15)
BUN: 23 mg/dL (ref 8–23)
CO2: 23 mmol/L (ref 22–32)
CREATININE: 0.99 mg/dL (ref 0.61–1.24)
Calcium: 8.2 mg/dL — ABNORMAL LOW (ref 8.9–10.3)
Chloride: 107 mmol/L (ref 98–111)
Glucose, Bld: 234 mg/dL — ABNORMAL HIGH (ref 70–99)
Potassium: 3.7 mmol/L (ref 3.5–5.1)
SODIUM: 140 mmol/L (ref 135–145)

## 2018-07-22 LAB — GLUCOSE, CAPILLARY
GLUCOSE-CAPILLARY: 275 mg/dL — AB (ref 70–99)
GLUCOSE-CAPILLARY: 165 mg/dL — AB (ref 70–99)
Glucose-Capillary: 196 mg/dL — ABNORMAL HIGH (ref 70–99)
Glucose-Capillary: 169 mg/dL — ABNORMAL HIGH (ref 70–99)

## 2018-07-22 LAB — MAGNESIUM: MAGNESIUM: 1.5 mg/dL — AB (ref 1.7–2.4)

## 2018-07-22 MED FILL — Midazolam HCl Inj 2 MG/2ML (Base Equivalent): INTRAMUSCULAR | Qty: 0.5 | Status: AC

## 2018-07-22 MED FILL — Fentanyl Citrate Preservative Free (PF) Inj 100 MCG/2ML: INTRAMUSCULAR | Qty: 0.5 | Status: AC

## 2018-07-22 NOTE — Progress Notes (Signed)
   VASCULAR SURGERY ASSESSMENT & PLAN:   STATUS POST ANGIOPLASTY AND STENTING OF THE RIGHT EXTERNAL ILIAC ARTERY: He has a palpable right femoral pulse.  The left groin site looks fine.  Dr. Donnetta Hutching will follow the right lower extremity as an outpatient.  Vascular surgery will be available as needed.  SUBJECTIVE:   No complaints.  PHYSICAL EXAM:   Vitals:   07/22/18 0745 07/22/18 0900 07/22/18 1100 07/22/18 1250  BP:    (!) 152/64  Pulse:    (!) 47  Resp:  (!) 22 18 16   Temp:    97.9 F (36.6 C)  TempSrc:    Oral  SpO2: 95%   94%  Weight:      Height:       Palpable right femoral pulse. Some ecchymosis in the left groin but no hematoma.  LABS:   Lab Results  Component Value Date   WBC 4.3 07/22/2018   HGB 10.0 (L) 07/22/2018   HCT 32.6 (L) 07/22/2018   MCV 89.1 07/22/2018   PLT 117 (L) 07/22/2018   Lab Results  Component Value Date   CREATININE 0.99 07/22/2018   No results found for: INR, PROTIME CBG (last 3)  Recent Labs    07/21/18 2110 07/22/18 0632 07/22/18 1116  GLUCAP 264* 275* 196*    PROBLEM LIST:    Principal Problem:   Acute encephalopathy Active Problems:   PVD (peripheral vascular disease) (HCC)   COPD (chronic obstructive pulmonary disease) ([place])   Essential hypertension   Type 2 diabetes mellitus with other specified complication (HCC)   Unspecified atrial fibrillation (HCC)   Malnutrition of moderate degree   Acute combined systolic and diastolic heart failure (HCC)   Pressure injury of skin   Coronary artery disease involving native coronary artery of native heart without angina pectoris   CURRENT MEDS:   . aspirin EC  81 mg Oral Daily  . atorvastatin  40 mg Oral Daily  . docusate sodium  100 mg Oral BID  . feeding supplement (GLUCERNA SHAKE)  237 mL Oral BID BM  . insulin aspart  0-15 Units Subcutaneous TID WC  . insulin aspart  0-5 Units Subcutaneous QHS  . metoprolol tartrate  25 mg Oral BID  . mometasone-formoterol  2 puff  Inhalation BID  . multivitamin with minerals  1 tablet Oral Daily  . sodium chloride flush  3 mL Intravenous Q12H  . sodium chloride flush  3 mL Intravenous Q12H  . sodium chloride flush  3 mL Intravenous Q12H    Deitra Mayo Beeper: [phone] Office: [phone] 07/22/2018

## 2018-07-22 NOTE — Progress Notes (Addendum)
Pt's right arm is swollen and red. Hep gtt was stopped around 0200 d/t leakage at the site. Pharmacy consulted. Advised to hold the hep until MD makes rounds in am. Arm elevated with ice pack. Pt states minor discomfort. Pt is resting. Call bell within reach.   Fransico Michael, RN

## 2018-07-22 NOTE — Progress Notes (Signed)
Inpatient Diabetes Program Recommendations  AACE/ADA: New Consensus Statement on Inpatient Glycemic Control (2015)  Target Ranges:  Prepandial:   less than 140 mg/dL      Peak postprandial:   less than 180 mg/dL (1-2 hours)      Critically ill patients:  140 - 180 mg/dL   Results for Derek Hinton, Derek Hinton (MRN [id number]) as of 07/22/2018 11:33  Ref. Range 07/21/2018 06:23 07/21/2018 09:07 07/21/2018 11:00 07/21/2018 17:11 07/21/2018 21:10 07/22/2018 06:32 07/22/2018 11:16  Glucose-Capillary Latest Ref Range: 70 - 99 mg/dL 190 (H) 197 (H) 181 (H) 248 (H) 264 (H) 275 (H) 196 (H)   Review of Glycemic Control  Diabetes history: DM 2 Outpatient Diabetes medications: Glipizide 5 mg Daily, Metformin 1000 mg BID Current orders for Inpatient glycemic control: Novolog 0-15 units tid, Novolog 0-5 units qhs  Inpatient Diabetes Program Recommendations:    Glucose trends elevated. Consider Lantus 5 units (little less than 0.1 units/kg).  Thanks,  Tama Headings RN, MSN, BC-ADM Inpatient Diabetes Coordinator Team Pager [phone] (8a-5p)

## 2018-07-22 NOTE — Progress Notes (Signed)
Physical Therapy Treatment Patient Details Name: Derek Hinton MRN: [id number] DOB: 1946/07/10 Today's Date: 07/22/2018    History of Present Illness Pt is a 72 y/o male admitted secondary to being found down at home for an unknown time and with acute encephalopathy. Pt also found to be in a-fib. PMH including but not limited to L transtibial amputation, PVD, HTN, DM and COPD.    PT Comments    Pt received in recliner, agreeable to participation in therapy. Attempted donning L prosthesis without success. Edema present in L residual limb. Pt unable to get silicon sleeve to lock in prosthesis. He reports not having worn his prosthesis for several days. Pt instead performed lateral/scoot transfer recliner to bed (per his preference). Silicon sleeve left on in bed to assist with L residual limb edema.     Follow Up Recommendations  SNF;Supervision/Assistance - 24 hour     Equipment Recommendations  None recommended by PT    Recommendations for Other Services       Precautions / Restrictions Precautions Precautions: Fall Precaution Comments: prior L transtibial amputation with prosthesis in room    Mobility  Bed Mobility Overal bed mobility: Needs Assistance Bed Mobility: Sit to Supine       Sit to supine: Min assist   General bed mobility comments: assist with BLE into bed  Transfers Overall transfer level: Needs assistance   Transfers: Lateral/Scoot Transfers          Lateral/Scoot Transfers: Min assist General transfer comment: Pt unable to don L prosthesis due to edema. Performed lateral/scoot transfer recliner to bed (per pt preference). Pt left silicon sleeve on L residual limb in bed to help with edema.   Ambulation/Gait             General Gait Details: unable due to inability to don prosthesis (edema L residual limb)   Stairs             Wheelchair Mobility    Modified Rankin (Stroke Patients Only)       Balance                                             Cognition Arousal/Alertness: Awake/alert Behavior During Therapy: WFL for tasks assessed/performed Overall Cognitive Status: Within Functional Limits for tasks assessed                                        Exercises      General Comments        Pertinent Vitals/Pain Pain Assessment: No/denies pain    Home Living                      Prior Function            PT Goals (current goals can now be found in the care plan section) Acute Rehab PT Goals Patient Stated Goal: to walk PT Goal Formulation: With patient Time For Goal Achievement: 07/31/18 Potential to Achieve Goals: Good Progress towards PT goals: Progressing toward goals    Frequency    Min 3X/week      PT Plan Current plan remains appropriate    Co-evaluation              AM-PAC PT "6 Clicks" Daily Activity  Outcome Measure  Difficulty turning over in bed (including adjusting bedclothes, sheets and blankets)?: A Little Difficulty moving from lying on back to sitting on the side of the bed? : Unable Difficulty sitting down on and standing up from a chair with arms (e.g., wheelchair, bedside commode, etc,.)?: Unable Help needed moving to and from a bed to chair (including a wheelchair)?: A Little Help needed walking in hospital room?: A Little Help needed climbing 3-5 steps with a railing? : Total 6 Click Score: 12    End of Session Equipment Utilized During Treatment: Gait belt Activity Tolerance: Patient tolerated treatment well Patient left: in bed;with call bell/phone within reach Nurse Communication: Mobility status PT Visit Diagnosis: Other abnormalities of gait and mobility (R26.89)     Time: 8182-9937 PT Time Calculation (min) (ACUTE ONLY): 19 min  Charges:  $Therapeutic Activity: 8-22 mins                     Lorrin Goodell, PT  Office # [phone] Pager [phone]    Lorriane Shire 07/22/2018, 1:16  PM

## 2018-07-22 NOTE — Progress Notes (Signed)
Progress Note  Patient Name: Derek Hinton Date of Encounter: 07/22/2018  Primary Cardiologist: Sanda Klein, MD    Subjective   72 year old gentleman with a history of hypertension, diabetes mellitus, left BKA who was admitted with heart failure.  Heart catheterization revealed severe two-vessel coronary artery disease.  He is been referred to [facility] for consideration for coronary artery bypass grafting but was turned down.   Patient also been seen by our invasive cardiology team was thought to be at very high risk for PCI.  He has severe peripheral vascular disease and would not be able to have a support device during any PCI attempts.   Dr. Irish Lack has suggested medical therapy.   The patient agrees with the plan .   Inpatient Medications    Scheduled Meds: . aspirin EC  81 mg Oral Daily  . atorvastatin  40 mg Oral Daily  . docusate sodium  100 mg Oral BID  . feeding supplement (GLUCERNA SHAKE)  237 mL Oral BID BM  . insulin aspart  0-15 Units Subcutaneous TID WC  . insulin aspart  0-5 Units Subcutaneous QHS  . metoprolol tartrate  25 mg Oral BID  . mometasone-formoterol  2 puff Inhalation BID  . multivitamin with minerals  1 tablet Oral Daily  . sodium chloride flush  3 mL Intravenous Q12H  . sodium chloride flush  3 mL Intravenous Q12H  . sodium chloride flush  3 mL Intravenous Q12H   Continuous Infusions: . sodium chloride Stopped (07/18/18 0700)  . sodium chloride    . sodium chloride    . heparin Stopped (07/22/18 0200)   PRN Meds: sodium chloride, sodium chloride, acetaminophen, acetaminophen, albuterol, hydrALAZINE, hydrALAZINE, labetalol, morphine injection, ondansetron **OR** ondansetron (ZOFRAN) IV, ondansetron (ZOFRAN) IV, sodium chloride flush, sodium chloride flush, traMADol   Vital Signs    Vitals:   07/22/18 0745 07/22/18 0900 07/22/18 1100 07/22/18 1250  BP:    (!) 152/64  Pulse:    (!) 47  Resp:  (!) 22 18 16   Temp:    97.9 F (36.6 C)    TempSrc:    Oral  SpO2: 95%   94%  Weight:      Height:        Intake/Output Summary (Last 24 hours) at 07/22/2018 1355 Last data filed at 07/22/2018 1200 Gross per 24 hour  Intake 720 ml  Output 2050 ml  Net -1330 ml   Filed Weights   07/16/18 1100 07/21/18 0407  Weight: 62.6 kg 62.3 kg    Telemetry     NSR  - Personally Reviewed  ECG     NSR  - Personally Reviewed  Physical Exam    Physical Exam: Blood pressure (!) 152/64, pulse (!) 47, temperature 97.9 F (36.6 C), temperature source Oral, resp. rate 16, height 5\' 9"  (1.753 m), weight 62.3 kg, SpO2 94 %.  GEN:   Elderly , chronically ill appearing man  HEENT: Normal NECK: No JVD; No carotid bruits LYMPHATICS: No lymphadenopathy CARDIAC: RRR   RESPIRATORY:  Clear to auscultation without rales, wheezing or rhonchi  ABDOMEN: Soft, non-tender, non-distended MUSCULOSKELETAL:   S/p left BKA.  Right lower leg is red,  1+ edema  SKIN: Warm and dry NEUROLOGIC:  Alert and oriented x 3   Labs    Chemistry Recent Labs  Lab 07/15/18 2235  07/20/18 0300 07/21/18 0311 07/22/18 0408  NA 132*   < > 136 136 140  K 4.8   < > 4.9 5.5* 3.7  CL 100   < > 107 110 107  CO2 21*   < > 20* 15* 23  GLUCOSE 297*   < > 80 238* 234*  BUN 30*   < > 31* 34* 23  CREATININE 1.35*   < > 1.15 1.10 0.99  CALCIUM 8.7*   < > 8.0* 7.9* 8.2*  PROT 6.7  --   --   --   --   ALBUMIN 3.2*  --   --   --   --   AST 38  --   --   --   --   ALT 19  --   --   --   --   ALKPHOS 93  --   --   --   --   BILITOT 1.6*  --   --   --   --   GFRNONAA 50*   < > >60 >60 >60  GFRAA 58*   < > >60 >60 >60  ANIONGAP 11   < > 9 11 10    < > = values in this interval not displayed.     Hematology Recent Labs  Lab 07/20/18 0300 07/21/18 0311 07/22/18 0408  WBC 5.4 5.3 4.3  RBC 3.84* 4.23 3.66*  HGB 10.5* 11.4* 10.0*  HCT 34.9* 41.7 32.6*  MCV 90.9 98.6 89.1  MCH 27.3 27.0 27.3  MCHC 30.1 27.3* 30.7  RDW 17.1* 17.2* 16.9*  PLT 104* 106* 117*     Cardiac EnzymesNo results for input(s): TROPONINI in the last 168 hours. No results for input(s): TROPIPOC in the last 168 hours.   BNPNo results for input(s): BNP, PROBNP in the last 168 hours.   DDimer No results for input(s): DDIMER in the last 168 hours.   Radiology    No results found.  Cardiac Studies     Patient Profile     72 y.o. male with congestive heart failure, now found to have severe three-vessel coronary artery disease.  Assessment & Plan      1.  Coronary artery disease:   Patient is not having any angina.  He has been turned down for coronary artery bypass grafting and also is felt to be a very high risk for PCI.  Plan is for continued medical therapy. Discontinue the heparin. Continue aspirin. Might continue adding Plavix but right now he is having lots of bleeding and bruising from his IV sites.  2.  Acute on chronic combined systolic and diastolic congestive heart failure: Continue metoprolol and hydralazine.  He appears stable for Dc He wants to go to a SNF.  [facility] will sign off.   Medication Recommendations:  Continue current meds  Other recommendations (labs, testing, etc):   Follow up as an outpatient:  With Dr. Sallyanne Kuster and his primary MD      For questions or updates, please contact [place] Please consult [email or web address] for contact info under        Signed, Mertie Moores, MD  07/22/2018, 1:55 PM

## 2018-07-22 NOTE — Progress Notes (Signed)
PROGRESS NOTE    Derek Hinton  MWN:[id number] DOB: Sep 11, 1946 DOA: 07/15/2018 PCP: Sheral Apley, MD    Brief Narrative:  Patient is a 72 y.o. male with prior history of left BKA, DM-2, hypertension presented with confusion-he was apparently found down on the floor (was on the floor for almost 2 days)-he was also found to have new onset atrial fibrillation, and worsening leg ischemia. A 2D echocardiogram done showed EF around 15-20%.  Work-up in progress-see below for further details.  Assessment & Plan:   Principal Problem:   Acute encephalopathy Active Problems:   PVD (peripheral vascular disease) (HCC)   COPD (chronic obstructive pulmonary disease) (HCC)   Essential hypertension   Type 2 diabetes mellitus with other specified complication (HCC)   Unspecified atrial fibrillation (HCC)   Malnutrition of moderate degree   Acute combined systolic and diastolic heart failure (HCC)   Pressure injury of skin   Coronary artery disease involving native coronary artery of native heart without angina pectoris  Newly diagnosed chronic combined diastolic and systolic heart failure EF 15 to 20%: Remains compensated, continue beta-blocker hold ARB on 07/21/2018 due to hyperkalemia, seen by cardiology underwent left heart catheterization showing severe triple-vessel disease. Seen by CTS, not considered surgical candidate. Cardiology recommends maximizing medical management. Recommendation for continued ASA. Cardiology has since signed off  Peripheral vascular disease-with probable critical limb ischemia: Underwent arteriogram on 10/11 which showed 95% stenosis in the right external iliac artery-patient is now status post balloon angioplasty. On ASA alone given concerns of increased bleeding and bruising  Acute metabolic encephalopathy: appropriately conversant. Seems much improved    New onset atrial fibrillation: Remains in atrial fibrillation-rate controlled with metoprolol. Now on ASA per  Cardiology.   Hypertension: BP controlled-continue metoprolol, currently holding ACE/ARB due to hyperkalemia.  COPD: Stable without any exacerbation-continue bronchodilators. Lungs clear on auscultation  Left BKA - has prosthesis, PT recommendation for SNF. SW consulted. Pending placement  Mild to moderate hyperkalemia.  Held ARB per above. Lytes stable this AM, reviewed  DM-2: CBG stable with SSI.   DVT prophylaxis: Lovenox subq Code Status: DNR Family Communication: Pt in room, family not at bedside Disposition Plan: SNF, timing uncertain  Consultants:   Cardiology  CTS  Procedures:     Antimicrobials: Anti-infectives (From admission, onward)   None       Subjective: No complaints this AM  Objective: Vitals:   07/22/18 0745 07/22/18 0900 07/22/18 1100 07/22/18 1250  BP:    (!) 152/64  Pulse:    (!) 47  Resp:  (!) 22 18 16   Temp:    97.9 F (36.6 C)  TempSrc:    Oral  SpO2: 95%   94%  Weight:      Height:        Intake/Output Summary (Last 24 hours) at 07/22/2018 1635 Last data filed at 07/22/2018 1200 Gross per 24 hour  Intake 720 ml  Output 2050 ml  Net -1330 ml   Filed Weights   07/16/18 1100 07/21/18 0407  Weight: 62.6 kg 62.3 kg    Examination:  General exam: Appears calm and comfortable  Respiratory system: Clear to auscultation. Respiratory effort normal. Cardiovascular system: S1 & S2 heard, RRR Gastrointestinal system: Abdomen is nondistended, soft and nontender. No organomegaly or masses felt. Normal bowel sounds heard. Central nervous system: Alert and oriented. No focal neurological deficits. Extremities: Symmetric 5 x 5 power. Skin: No rashes, lesions Psychiatry: Judgement and insight appear normal. Mood & affect appropriate.  Data Reviewed: I have personally reviewed following labs and imaging studies  CBC: Recent Labs  Lab 07/15/18 2235  07/18/18 0500 07/19/18 0346 07/20/18 0300 07/21/18 0311 07/22/18 0408    WBC 7.0   < > 5.3 3.6* 5.4 5.3 4.3  NEUTROABS 6.0  --   --   --   --   --   --   HGB 11.3*   < > 11.0* 10.0* 10.5* 11.4* 10.0*  HCT 38.1*   < > 37.5* 32.6* 34.9* 41.7 32.6*  MCV 90.3   < > 91.2 90.6 90.9 98.6 89.1  PLT 123*   < > 110* 89* 104* 106* 117*   < > = values in this interval not displayed.   Basic Metabolic Panel: Recent Labs  Lab 07/18/18 0500 07/19/18 0346 07/20/18 0300 07/21/18 0311 07/22/18 0408  NA 136 136 136 136 140  K 4.4 4.5 4.9 5.5* 3.7  CL 104 107 107 110 107  CO2 23 23 20* 15* 23  GLUCOSE 107* 149* 80 238* 234*  BUN 36* 28* 31* 34* 23  CREATININE 1.13 1.14 1.15 1.10 0.99  CALCIUM 8.0* 7.7* 8.0* 7.9* 8.2*  MG  --   --   --   --  1.5*   GFR: Estimated Creatinine Clearance: 57.7 mL/min (by C-G formula based on SCr of 0.99 mg/dL). Liver Function Tests: Recent Labs  Lab 07/15/18 2235  AST 38  ALT 19  ALKPHOS 93  BILITOT 1.6*  PROT 6.7  ALBUMIN 3.2*   No results for input(s): LIPASE, AMYLASE in the last 168 hours. No results for input(s): AMMONIA in the last 168 hours. Coagulation Profile: No results for input(s): INR, PROTIME in the last 168 hours. Cardiac Enzymes: Recent Labs  Lab 07/15/18 2235  CKTOTAL 276   BNP (last 3 results) No results for input(s): PROBNP in the last 8760 hours. HbA1C: No results for input(s): HGBA1C in the last 72 hours. CBG: Recent Labs  Lab 07/21/18 1711 07/21/18 2110 07/22/18 0632 07/22/18 1116 07/22/18 1626  GLUCAP 248* 264* 275* 196* 169*   Lipid Profile: No results for input(s): CHOL, HDL, LDLCALC, TRIG, CHOLHDL, LDLDIRECT in the last 72 hours. Thyroid Function Tests: No results for input(s): TSH, T4TOTAL, FREET4, T3FREE, THYROIDAB in the last 72 hours. Anemia Panel: No results for input(s): VITAMINB12, FOLATE, FERRITIN, TIBC, IRON, RETICCTPCT in the last 72 hours. Sepsis Labs: No results for input(s): PROCALCITON, LATICACIDVEN in the last 168 hours.  No results found for this or any previous  visit (from the past 240 hour(s)).   Radiology Studies: No results found.  Scheduled Meds: . aspirin EC  81 mg Oral Daily  . atorvastatin  40 mg Oral Daily  . docusate sodium  100 mg Oral BID  . feeding supplement (GLUCERNA SHAKE)  237 mL Oral BID BM  . insulin aspart  0-15 Units Subcutaneous TID WC  . insulin aspart  0-5 Units Subcutaneous QHS  . metoprolol tartrate  25 mg Oral BID  . mometasone-formoterol  2 puff Inhalation BID  . multivitamin with minerals  1 tablet Oral Daily  . sodium chloride flush  3 mL Intravenous Q12H  . sodium chloride flush  3 mL Intravenous Q12H  . sodium chloride flush  3 mL Intravenous Q12H   Continuous Infusions: . sodium chloride Stopped (07/18/18 0700)  . sodium chloride    . sodium chloride       LOS: 6 days   Marylu Lund, MD [facility] Pager On Amion  If  7PM-7AM, please contact night-coverage 07/22/2018, 4:35 PM

## 2018-07-23 LAB — CBC
HEMATOCRIT: 30.7 % — AB (ref 39.0–52.0)
Hemoglobin: 9.2 g/dL — ABNORMAL LOW (ref 13.0–17.0)
MCH: 26.8 pg (ref 26.0–34.0)
MCHC: 30 g/dL (ref 30.0–36.0)
MCV: 89.5 fL (ref 80.0–100.0)
PLATELETS: 123 10*3/uL — AB (ref 150–400)
RBC: 3.43 MIL/uL — ABNORMAL LOW (ref 4.22–5.81)
RDW: 17.1 % — ABNORMAL HIGH (ref 11.5–15.5)
WBC: 3.8 10*3/uL — ABNORMAL LOW (ref 4.0–10.5)
nRBC: 0 % (ref 0.0–0.2)

## 2018-07-23 LAB — GLUCOSE, CAPILLARY
GLUCOSE-CAPILLARY: 160 mg/dL — AB (ref 70–99)
Glucose-Capillary: 209 mg/dL — ABNORMAL HIGH (ref 70–99)
Glucose-Capillary: 238 mg/dL — ABNORMAL HIGH (ref 70–99)
Glucose-Capillary: 191 mg/dL — ABNORMAL HIGH (ref 70–99)

## 2018-07-23 LAB — BASIC METABOLIC PANEL
ANION GAP: 7 (ref 5–15)
BUN: 18 mg/dL (ref 8–23)
CO2: 23 mmol/L (ref 22–32)
Calcium: 7.9 mg/dL — ABNORMAL LOW (ref 8.9–10.3)
Chloride: 109 mmol/L (ref 98–111)
Creatinine, Ser: 0.91 mg/dL (ref 0.61–1.24)
GFR calc Af Amer: >60 mL/min (ref >60)
GLUCOSE: 174 mg/dL — AB (ref 70–99)
POTASSIUM: 3.9 mmol/L (ref 3.5–5.1)
Sodium: 139 mmol/L (ref 135–145)

## 2018-07-23 MED ORDER — GLIPIZIDE 5 MG PO TABS
5.0000 mg | ORAL_TABLET | Freq: Every day | ORAL | Status: DC
  Administered 2018-07-24: 5 mg via ORAL
  Filled 2018-07-23: qty 1

## 2018-07-23 MED ORDER — RAMIPRIL 2.5 MG PO CAPS
2.5000 mg | ORAL_CAPSULE | Freq: Every day | ORAL | Status: DC
  Administered 2018-07-23 – 2018-07-24 (×2): 2.5 mg via ORAL
  Filled 2018-07-23 (×2): qty 1

## 2018-07-23 NOTE — Progress Notes (Signed)
PROGRESS NOTE    Derek Hinton  MVH:[id number] DOB: 1946/09/16 DOA: 07/15/2018 PCP: Sheral Apley, MD    Brief Narrative:  Patient is a 72 y.o. male with prior history of left BKA, DM-2, hypertension presented with confusion-he was apparently found down on the floor (was on the floor for almost 2 days)-he was also found to have new onset atrial fibrillation, and worsening leg ischemia. A 2D echocardiogram done showed EF around 15-20%.  Work-up in progress-see below for further details.  Assessment & Plan:   Principal Problem:   Acute encephalopathy Active Problems:   PVD (peripheral vascular disease) (HCC)   COPD (chronic obstructive pulmonary disease) (HCC)   Essential hypertension   Type 2 diabetes mellitus with other specified complication (HCC)   Unspecified atrial fibrillation (HCC)   Malnutrition of moderate degree   Acute combined systolic and diastolic heart failure (HCC)   Pressure injury of skin   Coronary artery disease involving native coronary artery of native heart without angina pectoris  Newly diagnosed chronic combined diastolic and systolic heart failure EF 15 to 20% Remains compensated, continue beta-blocker, ARB Seen by cardiology underwent left heart catheterization showing severe triple-vessel disease. Seen by CTS, not considered surgical candidate Cardiology recommends maximizing medical management Recommendation for continued ASA. Cardiology has since signed off  Peripheral vascular disease-with probable critical limb ischemia Underwent arteriogram on 10/11 which showed 95% stenosis in the right external iliac artery-patient is now status post balloon angioplasty On ASA alone given concerns of increased bleeding and bruising  Acute metabolic encephalopathy Resolved  New onset atrial fibrillation Remains in atrial fibrillation-rate controlled with metoprolol Now on ASA per Cardiology  Hypertension Continue metoprolol, ramipril  COPD Stable  without any exacerbation Continue bronchodilators  Left BKA Has prosthesis, PT recommendation for SNF  DM type 2 Continue SSI, accuchecks, start home glipizide    DVT prophylaxis: Lovenox subq Code Status: DNR Family Communication: None at bedside Disposition Plan: SNF  Consultants:   Cardiology  CTS  Procedures:   Cardiac cath  Lower extremity balloon angioplasty  Antimicrobials: Anti-infectives (From admission, onward)   None      Subjective: Denies any new complaints  Objective: Vitals:   07/22/18 2106 07/22/18 2107 07/23/18 0630 07/23/18 1237  BP: (!) 161/81 (!) 161/81 (!) 153/65 136/63  Pulse:  64 66 (!) 59  Resp: (!) 28  12 18   Temp:   97.6 F (36.4 C) 97.7 F (36.5 C)  TempSrc:   Oral Oral  SpO2:   97% 94%  Weight:      Height:        Intake/Output Summary (Last 24 hours) at 07/23/2018 1826 Last data filed at 07/23/2018 1742 Gross per 24 hour  Intake 960 ml  Output 900 ml  Net 60 ml   Filed Weights   07/16/18 1100 07/21/18 0407  Weight: 62.6 kg 62.3 kg    Examination:  General exam: NAD Respiratory system: CTAB Cardiovascular system: S1 & S2 present Gastrointestinal system: Abdomen is soft, NT, ND, BS present Extremities: No pedal edema bilaterally Skin: Normal Psychiatry: Normal mood  Data Reviewed: I have personally reviewed following labs and imaging studies  CBC: Recent Labs  Lab 07/19/18 0346 07/20/18 0300 07/21/18 0311 07/22/18 0408 07/23/18 0313  WBC 3.6* 5.4 5.3 4.3 3.8*  HGB 10.0* 10.5* 11.4* 10.0* 9.2*  HCT 32.6* 34.9* 41.7 32.6* 30.7*  MCV 90.6 90.9 98.6 89.1 89.5  PLT 89* 104* 106* 117* 841*   Basic Metabolic Panel: Recent Labs  Lab 07/19/18  1324 07/20/18 0300 07/21/18 0311 07/22/18 0408 07/23/18 0313  NA 136 136 136 140 139  K 4.5 4.9 5.5* 3.7 3.9  CL 107 107 110 107 109  CO2 23 20* 15* 23 23  GLUCOSE 149* 80 238* 234* 174*  BUN 28* 31* 34* 23 18  CREATININE 1.14 1.15 1.10 0.99 0.91    CALCIUM 7.7* 8.0* 7.9* 8.2* 7.9*  MG  --   --   --  1.5*  --    GFR: Estimated Creatinine Clearance: 62.8 mL/min (by C-G formula based on SCr of 0.91 mg/dL). Liver Function Tests: No results for input(s): AST, ALT, ALKPHOS, BILITOT, PROT, ALBUMIN in the last 168 hours. No results for input(s): LIPASE, AMYLASE in the last 168 hours. No results for input(s): AMMONIA in the last 168 hours. Coagulation Profile: No results for input(s): INR, PROTIME in the last 168 hours. Cardiac Enzymes: No results for input(s): CKTOTAL, CKMB, CKMBINDEX, TROPONINI in the last 168 hours. BNP (last 3 results) No results for input(s): PROBNP in the last 8760 hours. HbA1C: No results for input(s): HGBA1C in the last 72 hours. CBG: Recent Labs  Lab 07/22/18 1626 07/22/18 2055 07/23/18 0632 07/23/18 1137 07/23/18 1637  GLUCAP 169* 165* 160* 209* 238*   Lipid Profile: No results for input(s): CHOL, HDL, LDLCALC, TRIG, CHOLHDL, LDLDIRECT in the last 72 hours. Thyroid Function Tests: No results for input(s): TSH, T4TOTAL, FREET4, T3FREE, THYROIDAB in the last 72 hours. Anemia Panel: No results for input(s): VITAMINB12, FOLATE, FERRITIN, TIBC, IRON, RETICCTPCT in the last 72 hours. Sepsis Labs: No results for input(s): PROCALCITON, LATICACIDVEN in the last 168 hours.  No results found for this or any previous visit (from the past 240 hour(s)).   Radiology Studies: No results found.  Scheduled Meds: . aspirin EC  81 mg Oral Daily  . atorvastatin  40 mg Oral Daily  . docusate sodium  100 mg Oral BID  . feeding supplement (GLUCERNA SHAKE)  237 mL Oral BID BM  . insulin aspart  0-15 Units Subcutaneous TID WC  . insulin aspart  0-5 Units Subcutaneous QHS  . metoprolol tartrate  25 mg Oral BID  . mometasone-formoterol  2 puff Inhalation BID  . multivitamin with minerals  1 tablet Oral Daily  . ramipril  2.5 mg Oral Daily  . sodium chloride flush  3 mL Intravenous Q12H  . sodium chloride flush  3 mL  Intravenous Q12H  . sodium chloride flush  3 mL Intravenous Q12H   Continuous Infusions: . sodium chloride Stopped (07/18/18 0700)  . sodium chloride    . sodium chloride       LOS: 7 days   Alma Friendly, MD [facility] Pager On Amion  If 7PM-7AM, please contact night-coverage 07/23/2018, 6:26 PM

## 2018-07-23 NOTE — Progress Notes (Signed)
CSW contacted Jenny Reichmann in Admissions at [facility], they will have a bed available for patient tomorrow, if medically stable.  CSW To follow.  Laveda Abbe, [place] Clinical Social Worker [phone]

## 2018-07-23 NOTE — Progress Notes (Signed)
Physical Therapy Treatment Patient Details Name: Derek Hinton MRN: [id number] DOB: April 22, 1946 Today's Date: 07/23/2018    History of Present Illness Pt is a 72 y/o male admitted secondary to being found down at home for an unknown time and with acute encephalopathy. Pt also found to be in a-fib. PMH including but not limited to L transtibial amputation, PVD, HTN, DM and COPD.    PT Comments    Patient making excellent progress towards his physical therapy goals. Increased ambulation distance to 80 feet x 2 with prosthesis donned and walker. Requiring minimal assistance for all aspects of functional mobility. Remains very motivated and eager to participate in therapy, stating, "I didn't know I could do that!" D/c plan remains appropriate.    Follow Up Recommendations  SNF;Supervision/Assistance - 24 hour     Equipment Recommendations  None recommended by PT    Recommendations for Other Services       Precautions / Restrictions Precautions Precautions: Fall Precaution Comments: prior L transtibial amputation with prosthesis in room Restrictions Weight Bearing Restrictions: No    Mobility  Bed Mobility               General bed mobility comments: OOB in chair  Transfers Overall transfer level: Needs assistance Equipment used: Rolling walker (2 wheeled) Transfers: Sit to/from Stand Sit to Stand: Min assist         General transfer comment: Min assist provided to stand from recliner.   Ambulation/Gait Ambulation/Gait assistance: Min assist Gait Distance (Feet): 80 Feet, 80 Assistive device: Rolling walker (2 wheeled) Gait Pattern/deviations: Step-through pattern;Decreased stride length Gait velocity: decr Gait velocity interpretation: <1.8 ft/sec, indicate of risk for recurrent falls General Gait Details: Ambulation performed with LLE prosthesis donned. Irregular step length and pt reporting prosthesis felt "heavy." Chair follow utilized and patient required  one seated rest break. Cues for walker proximity.   Stairs             Wheelchair Mobility    Modified Rankin (Stroke Patients Only)       Balance Overall balance assessment: Needs assistance Sitting-balance support: No upper extremity supported Sitting balance-Leahy Scale: Good     Standing balance support: Bilateral upper extremity supported Standing balance-Leahy Scale: Poor                              Cognition Arousal/Alertness: Awake/alert Behavior During Therapy: WFL for tasks assessed/performed Overall Cognitive Status: Within Functional Limits for tasks assessed                                        Exercises      General Comments        Pertinent Vitals/Pain Pain Assessment: No/denies pain    Home Living                      Prior Function            PT Goals (current goals can now be found in the care plan section) Acute Rehab PT Goals Patient Stated Goal: "walk 3 times today." Potential to Achieve Goals: Good Progress towards PT goals: Progressing toward goals    Frequency    Min 3X/week      PT Plan Current plan remains appropriate    Co-evaluation  AM-PAC PT "6 Clicks" Daily Activity  Outcome Measure  Difficulty turning over in bed (including adjusting bedclothes, sheets and blankets)?: A Little Difficulty moving from lying on back to sitting on the side of the bed? : A Little Difficulty sitting down on and standing up from a chair with arms (e.g., wheelchair, bedside commode, etc,.)?: Unable Help needed moving to and from a bed to chair (including a wheelchair)?: A Little Help needed walking in hospital room?: A Little Help needed climbing 3-5 steps with a railing? : A Lot 6 Click Score: 15    End of Session Equipment Utilized During Treatment: Gait belt Activity Tolerance: Patient tolerated treatment well Patient left: in chair;with call bell/phone within  reach Nurse Communication: Mobility status PT Visit Diagnosis: Other abnormalities of gait and mobility (R26.89)     Time: 4627-0350 PT Time Calculation (min) (ACUTE ONLY): 14 min  Charges:  $Gait Training: 8-22 mins                    Ellamae Sia, PT, DPT Acute Rehabilitation Services Pager [phone] Office [phone]    Willy Eddy 07/23/2018, 4:56 PM

## 2018-07-23 NOTE — Care Management Important Message (Signed)
Important Message  Patient Details  Name: Derek Hinton MRN: [id number] Date of Birth: August 06, 1946   Medicare Important Message Given:  Yes    Khaleel Beckom P Natosha Bou 07/23/2018, 1:00 PM

## 2018-07-24 DIAGNOSIS — I5041 Acute combined systolic (congestive) and diastolic (congestive) heart failure: Secondary | ICD-10-CM | POA: Diagnosis not present

## 2018-07-24 DIAGNOSIS — I4819 Other persistent atrial fibrillation: Secondary | ICD-10-CM | POA: Diagnosis not present

## 2018-07-24 DIAGNOSIS — J449 Chronic obstructive pulmonary disease, unspecified: Secondary | ICD-10-CM | POA: Diagnosis not present

## 2018-07-24 DIAGNOSIS — I255 Ischemic cardiomyopathy: Secondary | ICD-10-CM | POA: Diagnosis not present

## 2018-07-24 DIAGNOSIS — E1165 Type 2 diabetes mellitus with hyperglycemia: Secondary | ICD-10-CM | POA: Diagnosis not present

## 2018-07-24 DIAGNOSIS — E440 Moderate protein-calorie malnutrition: Secondary | ICD-10-CM | POA: Diagnosis not present

## 2018-07-24 DIAGNOSIS — M2550 Pain in unspecified joint: Secondary | ICD-10-CM | POA: Diagnosis not present

## 2018-07-24 DIAGNOSIS — I4891 Unspecified atrial fibrillation: Secondary | ICD-10-CM | POA: Diagnosis not present

## 2018-07-24 DIAGNOSIS — I25810 Atherosclerosis of coronary artery bypass graft(s) without angina pectoris: Secondary | ICD-10-CM | POA: Diagnosis not present

## 2018-07-24 DIAGNOSIS — G9340 Encephalopathy, unspecified: Secondary | ICD-10-CM | POA: Diagnosis not present

## 2018-07-24 DIAGNOSIS — E1169 Type 2 diabetes mellitus with other specified complication: Secondary | ICD-10-CM | POA: Diagnosis not present

## 2018-07-24 DIAGNOSIS — D649 Anemia, unspecified: Secondary | ICD-10-CM | POA: Diagnosis not present

## 2018-07-24 DIAGNOSIS — I739 Peripheral vascular disease, unspecified: Secondary | ICD-10-CM | POA: Diagnosis not present

## 2018-07-24 DIAGNOSIS — Z7401 Bed confinement status: Secondary | ICD-10-CM | POA: Diagnosis not present

## 2018-07-24 DIAGNOSIS — C4371 Malignant melanoma of right lower limb, including hip: Secondary | ICD-10-CM | POA: Diagnosis not present

## 2018-07-24 DIAGNOSIS — I5040 Unspecified combined systolic (congestive) and diastolic (congestive) heart failure: Secondary | ICD-10-CM | POA: Diagnosis not present

## 2018-07-24 DIAGNOSIS — I5043 Acute on chronic combined systolic (congestive) and diastolic (congestive) heart failure: Secondary | ICD-10-CM | POA: Diagnosis not present

## 2018-07-24 DIAGNOSIS — E119 Type 2 diabetes mellitus without complications: Secondary | ICD-10-CM | POA: Diagnosis not present

## 2018-07-24 DIAGNOSIS — I2510 Atherosclerotic heart disease of native coronary artery without angina pectoris: Secondary | ICD-10-CM | POA: Diagnosis not present

## 2018-07-24 DIAGNOSIS — I110 Hypertensive heart disease with heart failure: Secondary | ICD-10-CM | POA: Diagnosis not present

## 2018-07-24 DIAGNOSIS — R5381 Other malaise: Secondary | ICD-10-CM | POA: Diagnosis not present

## 2018-07-24 DIAGNOSIS — Z23 Encounter for immunization: Secondary | ICD-10-CM | POA: Diagnosis not present

## 2018-07-24 DIAGNOSIS — I10 Essential (primary) hypertension: Secondary | ICD-10-CM | POA: Diagnosis not present

## 2018-07-24 DIAGNOSIS — Z89512 Acquired absence of left leg below knee: Secondary | ICD-10-CM | POA: Diagnosis not present

## 2018-07-24 DIAGNOSIS — I509 Heart failure, unspecified: Secondary | ICD-10-CM | POA: Diagnosis not present

## 2018-07-24 LAB — GLUCOSE, CAPILLARY
GLUCOSE-CAPILLARY: 166 mg/dL — AB (ref 70–99)
Glucose-Capillary: 197 mg/dL — ABNORMAL HIGH (ref 70–99)

## 2018-07-24 MED ORDER — GLUCERNA SHAKE PO LIQD: 237.0000 mL | Freq: Two times a day (BID) | ORAL | 0 refills | Status: DC

## 2018-07-24 MED ORDER — ATORVASTATIN CALCIUM 40 MG PO TABS: 40.0000 mg | ORAL_TABLET | Freq: Every day | ORAL | Status: AC

## 2018-07-24 MED ORDER — ADULT MULTIVITAMIN W/MINERALS CH: 1.0000 | ORAL_TABLET | Freq: Every day | ORAL | Status: AC

## 2018-07-24 MED ORDER — METOPROLOL TARTRATE 25 MG PO TABS: 25.0000 mg | ORAL_TABLET | Freq: Two times a day (BID) | ORAL | Status: DC

## 2018-07-24 NOTE — Progress Notes (Signed)
Clinical Social Worker facilitated patient discharge including contacting patient family and facility to confirm patient discharge plans.  Clinical information faxed to facility and family agreeable with plan.  CSW arranged ambulance transport via [place] to [facility]   .  RN to call [phone] and ask to speak to RN Jackey Loge (pt will go in rm 202) for report prior to discharge.  Clinical Social Worker will sign off for now as social work intervention is no longer needed. Please consult [place] again if new need arises.  Rhea Pink, MSW, [place]

## 2018-07-24 NOTE — Discharge Summary (Signed)
Discharge Summary  Rolen Conger [id number]:[id number] DOB: 10-Aug-1946  PCP: Sheral Apley, MD  Admit date: 07/15/2018 Discharge date: 07/24/2018  Time spent: 35 mins  Recommendations for Outpatient Follow-up:  1. PCP 2. Cardiology 3. Vascular surgery  Discharge Diagnoses:  Active Hospital Problems   Diagnosis Date Noted  . Acute encephalopathy 07/16/2018  . Pressure injury of skin 07/21/2018  . Coronary artery disease involving native coronary artery of native heart without angina pectoris   . Acute combined systolic and diastolic heart failure ([place])   . Malnutrition of moderate degree 07/18/2018  . PVD (peripheral vascular disease) ([place]) 07/16/2018  . COPD (chronic obstructive pulmonary disease) ([place]) 07/16/2018  . Essential hypertension 07/16/2018  . Type 2 diabetes mellitus with other specified complication ([place]) 38/18/2993  . Unspecified atrial fibrillation ([place]) 07/16/2018    Resolved Hospital Problems  No resolved problems to display.    Discharge Condition: Stable  Diet recommendation: Heart healthy  Vitals:   07/24/18 0915 07/24/18 1202  BP:  (!) 154/73  Pulse: 64 60  Resp: 18   Temp:  (!) 97.4 F (36.3 C)  SpO2: 93%     History of present illness:  Patient is a72 y.o.male with prior history of left BKA, DM-2, hypertension presented with confusion-he was apparently found down on the floor (was on the floor for almost 2 days)-he was also found to have new onset atrial fibrillation, and worsening leg ischemia. A 2D echocardiogram done showed EF around 15-20%. Work-up in progress-see below for further details.   Pt denies any new complaints, stable for discharge to SNF with close follow up with PCP, cardiology and vascular surgery.   Hospital Course:  Principal Problem:   Acute encephalopathy Active Problems:   PVD (peripheral vascular disease) (HCC)   COPD (chronic obstructive pulmonary disease) (HCC)   Essential hypertension   Type 2 diabetes mellitus  with other specified complication (HCC)   Unspecified atrial fibrillation (HCC)   Malnutrition of moderate degree   Acute combined systolic and diastolic heart failure (HCC)   Pressure injury of skin   Coronary artery disease involving native coronary artery of native heart without angina pectoris  Newly diagnosed chronic combined diastolic and systolic heart failure EF 15 to 20% Remains compensated, continue beta-blocker, ARB Seen by cardiology underwent left heart catheterization showing severe triple-vessel disease. Seen by CTS, not considered surgical candidate Cardiology recommends maximizing medical management Recommendation for continued ASA Follow up with cardiology  Peripheral vascular disease-with probable critical limb ischemia Underwent arteriogram on 10/11 which showed 95% stenosis in the right external iliac artery-patient is now status post balloon angioplasty On ASA alone given concerns of increased bleeding and bruising Follow up with vascular surgery  Acute metabolic encephalopathy Resolved  New onset atrial fibrillation Remains in atrial fibrillation-rate controlled with metoprolol Now on ASA per Cardiology, follow up with cardiology  Hypertension Continue metoprolol, ramipril  COPD Stable without any exacerbation Continue bronchodilators  Left BKA Stable, has prosthesis  DM type 2 Continue home glipizide, metformin    Procedures:  Cardiac cath  Lower extremity balloon angioplasty   Consultations:  Cardiology  CTS  Vascular surgery  Discharge Exam: BP (!) 154/73 (BP Location: Right Arm)   Pulse 60   Temp (!) 97.4 F (36.3 C) (Oral)   Resp 18   Ht 5\' 9"  (1.753 m)   Wt 62.3 kg   SpO2 93%   BMI 20.28 kg/m   General: NAD Cardiovascular: S1, S2 present  Respiratory: CTAB  Discharge Instructions You were  cared for by a hospitalist during your hospital stay. If you have any questions about your discharge medications or the  care you received while you were in the hospital after you are discharged, you can call the unit and asked to speak with the hospitalist on call if the hospitalist that took care of you is not available. Once you are discharged, your primary care physician will handle any further medical issues. Please note that NO REFILLS for any discharge medications will be authorized once you are discharged, as it is imperative that you return to your primary care physician (or establish a relationship with a primary care physician if you do not have one) for your aftercare needs so that they can reassess your need for medications and monitor your lab values.   Allergies as of 07/24/2018      Reactions   Penicillins Other (See Comments)   Pt unsure, states he "didn't feel good" Patient states it made him "feel Funny"      Medication List    TAKE these medications   atorvastatin 40 MG tablet Commonly known as:  LIPITOR Take 1 tablet (40 mg total) by mouth daily. What changed:  how much to take   budesonide-formoterol 160-4.5 MCG/ACT inhaler Commonly known as:  SYMBICORT Inhale 2 puffs into the lungs 2 (two) times daily.   feeding supplement (GLUCERNA SHAKE) Liqd Take 237 mLs by mouth 2 (two) times daily between meals.   glipiZIDE 5 MG 24 hr tablet Commonly known as:  GLUCOTROL XL Take 5 mg by mouth daily.   metFORMIN 1000 MG tablet Commonly known as:  GLUCOPHAGE Take 1,000 mg by mouth 2 (two) times daily.   metoprolol tartrate 25 MG tablet Commonly known as:  LOPRESSOR Take 1 tablet (25 mg total) by mouth 2 (two) times daily. What changed:    medication strength  how much to take   multivitamin with minerals Tabs tablet Take 1 tablet by mouth daily. Start taking on:  07/25/2018   ramipril 2.5 MG capsule Commonly known as:  ALTACE Take 2.5 mg by mouth daily.   traMADol 50 MG tablet Commonly known as:  ULTRAM Take 50 mg by mouth every 6 (six) hours as needed for moderate pain.        Allergies  Allergen Reactions  . Penicillins Other (See Comments)    Pt unsure, states he "didn't feel good" Patient states it made him "feel Funny"     Contact information for follow-up providers    Sheral Apley, MD. Schedule an appointment as soon as possible for a visit in 1 week(s).   Specialty:  Family Medicine Contact information: [place] [address] [phone]        Sanda Klein, MD .   Specialty:  Cardiology Contact information: [address] [place] [address] [phone]        Angelia Mould, MD. Schedule an appointment as soon as possible for a visit in 2 week(s).   Specialties:  Vascular Surgery, Cardiology Contact information: [address] [place] [address] [phone]            Contact information for after-discharge care    [facility] .   Service:  Skilled Nursing Contact information: [place] [phone]                   The results of significant diagnostics from this  hospitalization (including imaging, microbiology, ancillary and laboratory) are listed below for reference.    Significant Diagnostic Studies: Ct Head Wo Contrast  Result Date: 07/16/2018 CLINICAL DATA:  Altered mental status.  Hypertension and diabetes. EXAM: CT HEAD WITHOUT CONTRAST TECHNIQUE: Contiguous axial images were obtained from the base of the skull through the vertex without intravenous contrast. COMPARISON:  None. FINDINGS: Brain: Small remote lacunar infarct of the left thalamus, image 19/3. Periventricular white matter and corona radiata hypodensities favor chronic ischemic microvascular white matter disease. Otherwise, the brainstem, cerebellum, cerebral peduncles, thalami, basal ganglia, basilar cisterns, and ventricular system appear within normal limits. No intracranial hemorrhage, mass lesion, or acute CVA.  Vascular: Atherosclerotic calcification of the vertebral arteries (especially the left) with punctate atherosclerotic calcification in the basilar artery. There is atherosclerotic calcification of the cavernous carotid arteries bilaterally. Skull: Unremarkable Sinuses/Orbits: Unremarkable Other: No supplemental non-categorized findings. IMPRESSION: 1. No acute intracranial findings. 2. Periventricular white matter and corona radiata hypodensities favor chronic ischemic microvascular white matter disease. 3. Small remote left thalamic lacunar infarct. 4. Atherosclerosis. Electronically Signed   By: Van Clines M.D.   On: 07/16/2018 03:01    Microbiology: No results found for this or any previous visit (from the past 240 hour(s)).   Labs: Basic Metabolic Panel: Recent Labs  Lab 07/19/18 0346 07/20/18 0300 07/21/18 0311 07/22/18 0408 07/23/18 0313  NA 136 136 136 140 139  K 4.5 4.9 5.5* 3.7 3.9  CL 107 107 110 107 109  CO2 23 20* 15* 23 23  GLUCOSE 149* 80 238* 234* 174*  BUN 28* 31* 34* 23 18  CREATININE 1.14 1.15 1.10 0.99 0.91  CALCIUM 7.7* 8.0* 7.9* 8.2* 7.9*  MG  --   --   --  1.5*  --    Liver Function Tests: No results for input(s): AST, ALT, ALKPHOS, BILITOT, PROT, ALBUMIN in the last 168 hours. No results for input(s): LIPASE, AMYLASE in the last 168 hours. No results for input(s): AMMONIA in the last 168 hours. CBC: Recent Labs  Lab 07/19/18 0346 07/20/18 0300 07/21/18 0311 07/22/18 0408 07/23/18 0313  WBC 3.6* 5.4 5.3 4.3 3.8*  HGB 10.0* 10.5* 11.4* 10.0* 9.2*  HCT 32.6* 34.9* 41.7 32.6* 30.7*  MCV 90.6 90.9 98.6 89.1 89.5  PLT 89* 104* 106* 117* 123*   Cardiac Enzymes: No results for input(s): CKTOTAL, CKMB, CKMBINDEX, TROPONINI in the last 168 hours. BNP: BNP (last 3 results) No results for input(s): BNP in the last 8760 hours.  ProBNP (last 3 results) No results for input(s): PROBNP in the last 8760 hours.  CBG: Recent Labs  Lab 07/23/18 1137  07/23/18 1637 07/23/18 2120 07/24/18 0607 07/24/18 1112  GLUCAP 209* 238* 191* 197* 166*       Signed:  Alma Friendly, MD [facility] 07/24/2018, 12:41 PM

## 2018-07-25 ENCOUNTER — Other Ambulatory Visit: Payer: Self-pay

## 2018-07-25 DIAGNOSIS — E1165 Type 2 diabetes mellitus with hyperglycemia: Secondary | ICD-10-CM | POA: Diagnosis not present

## 2018-07-25 DIAGNOSIS — G9340 Encephalopathy, unspecified: Secondary | ICD-10-CM | POA: Diagnosis not present

## 2018-07-25 DIAGNOSIS — J449 Chronic obstructive pulmonary disease, unspecified: Secondary | ICD-10-CM | POA: Diagnosis not present

## 2018-07-25 DIAGNOSIS — I10 Essential (primary) hypertension: Secondary | ICD-10-CM | POA: Diagnosis not present

## 2018-07-25 DIAGNOSIS — I4891 Unspecified atrial fibrillation: Secondary | ICD-10-CM | POA: Diagnosis not present

## 2018-08-03 DIAGNOSIS — G9340 Encephalopathy, unspecified: Secondary | ICD-10-CM | POA: Diagnosis not present

## 2018-08-03 DIAGNOSIS — I4891 Unspecified atrial fibrillation: Secondary | ICD-10-CM | POA: Diagnosis not present

## 2018-08-03 DIAGNOSIS — I10 Essential (primary) hypertension: Secondary | ICD-10-CM | POA: Diagnosis not present

## 2018-08-03 DIAGNOSIS — E1165 Type 2 diabetes mellitus with hyperglycemia: Secondary | ICD-10-CM | POA: Diagnosis not present

## 2018-08-04 ENCOUNTER — Encounter: Payer: Medicare Other | Admitting: Vascular Surgery

## 2018-08-10 DIAGNOSIS — G9340 Encephalopathy, unspecified: Secondary | ICD-10-CM | POA: Diagnosis not present

## 2018-08-10 DIAGNOSIS — E1165 Type 2 diabetes mellitus with hyperglycemia: Secondary | ICD-10-CM | POA: Diagnosis not present

## 2018-08-10 DIAGNOSIS — I4891 Unspecified atrial fibrillation: Secondary | ICD-10-CM | POA: Diagnosis not present

## 2018-08-10 DIAGNOSIS — I509 Heart failure, unspecified: Secondary | ICD-10-CM | POA: Diagnosis not present

## 2018-08-10 DIAGNOSIS — I10 Essential (primary) hypertension: Secondary | ICD-10-CM | POA: Diagnosis not present

## 2018-08-12 ENCOUNTER — Encounter: Payer: Self-pay | Admitting: Physician Assistant

## 2018-08-12 ENCOUNTER — Ambulatory Visit (INDEPENDENT_AMBULATORY_CARE_PROVIDER_SITE_OTHER): Payer: Medicare Other | Admitting: Physician Assistant

## 2018-08-12 VITALS — BP 153/79 | HR 78 | Ht 72.0 in | Wt 167.0 lb

## 2018-08-12 DIAGNOSIS — R5381 Other malaise: Secondary | ICD-10-CM | POA: Diagnosis not present

## 2018-08-12 DIAGNOSIS — J449 Chronic obstructive pulmonary disease, unspecified: Secondary | ICD-10-CM | POA: Diagnosis not present

## 2018-08-12 DIAGNOSIS — I10 Essential (primary) hypertension: Secondary | ICD-10-CM | POA: Diagnosis not present

## 2018-08-12 DIAGNOSIS — I25810 Atherosclerosis of coronary artery bypass graft(s) without angina pectoris: Secondary | ICD-10-CM

## 2018-08-12 DIAGNOSIS — Z89512 Acquired absence of left leg below knee: Secondary | ICD-10-CM | POA: Diagnosis not present

## 2018-08-12 DIAGNOSIS — I739 Peripheral vascular disease, unspecified: Secondary | ICD-10-CM | POA: Diagnosis not present

## 2018-08-12 DIAGNOSIS — I5043 Acute on chronic combined systolic (congestive) and diastolic (congestive) heart failure: Secondary | ICD-10-CM

## 2018-08-12 DIAGNOSIS — I4819 Other persistent atrial fibrillation: Secondary | ICD-10-CM

## 2018-08-12 DIAGNOSIS — I255 Ischemic cardiomyopathy: Secondary | ICD-10-CM | POA: Diagnosis not present

## 2018-08-12 DIAGNOSIS — E119 Type 2 diabetes mellitus without complications: Secondary | ICD-10-CM | POA: Diagnosis not present

## 2018-08-12 MED ORDER — FUROSEMIDE 40 MG PO TABS: 40.0000 mg | ORAL_TABLET | Freq: Two times a day (BID) | ORAL | 3 refills | Status: DC

## 2018-08-12 MED ORDER — SACUBITRIL-VALSARTAN 49-51 MG PO TABS: 1.0000 | ORAL_TABLET | Freq: Two times a day (BID) | ORAL | 3 refills | Status: AC

## 2018-08-12 NOTE — Progress Notes (Signed)
Thanks, very tough patient. MCr

## 2018-08-12 NOTE — Patient Instructions (Addendum)
Heart Failure Management: 1. Avoid salt 2. Limit daily fluid intake to 32-64 oz 3. Weigh yourself daily, call cardiology if weight increase by more than 3 lbs over night or 5 lbs in a single week.  Medication Instructions:  Increase Lasix to 40 mg twice daily. DISCONTINUE Ramipiril 5 mg tablet today.  START Entresto 49-51 twice daily on THURSDAY If you need a refill on your cardiac medications before your next appointment, please call your pharmacy.   Lab work: CBC, CMP REPEAT BMET IN 1 WEEK. If you have labs (blood work) drawn today and your tests are completely normal, you will receive your results only by: Marland Kitchen MyChart Message (if you have MyChart) OR . A paper copy in the mail If you have any lab test that is abnormal or we need to change your treatment, we will call you to review the results.  Testing/Procedures: NONE ORDERED.  Follow-Up: At [facility], you and your health needs are our priority.  As part of our continuing mission to provide you with exceptional heart care, we have created designated Provider Care Teams.  These Care Teams include your primary Cardiologist (physician) and Advanced Practice Providers (APPs -  Physician Assistants and Nurse Practitioners) who all work together to provide you with the care you need, when you need it. Marland Kitchen KEEP APPOINTMENT WITH HAO MENG, PA-C ON SCHEDULED APPOINTMENT.  Any Other Special Instructions Will Be Listed Below (If Applicable). COME HAVE LABS DRAWN AT SAME TIME AS VISIT NEXT WEEK.

## 2018-08-12 NOTE — Progress Notes (Signed)
Cardiology Office Note    Date:  08/12/2018   ID:  Derek Hinton, DOB April 22, 1946, MRN [id number]  PCP:  Sheral Apley, MD  Cardiologist:  Dr. Sallyanne Kuster   Chief Complaint  Patient presents with  . Hospitalization Follow-up    seen for Dr. Sallyanne Kuster    History of Present Illness:  Derek Hinton is a 72 y.o. male with PMH of PVD s/p L BKA, tobacco abuse, HTN, DM II, and COPD.  Patient recently presented to hospital after being found down in his home confused state.  On arrival to [facility], he was noted to be atrial fibrillation and that there was also some concern of right lower extremity ischemia.  Head CT was negative for acute issue, however does show chronic ischemic microvascular white matter disease and a small remote left thalamic lacunar infarct.  Vascular surgery was consulted for possible critical limb ischemia.  Patient was seen by Dr. early who does not feel there was profound ischemia to warrant emergent intervention.  Lower extremity arterial Doppler shows 75 to 99% stenosis in the right proximal to mid superficial femoral artery.  Echocardiogram obtained during this admission also showed severely reduced LVEF at 15 to 20%, severe TR, mild MR, grade 2 DD, PA peak pressure 47 mmHg.  He underwent lower extremity PV angiogram and had successful angioplasty and stenting of right external iliac artery.  He was initially placed on aspirin and Plavix, however Plavix was kicked taken off in the hospital due to significant bleeding and bruising.  Cardiac catheterization performed on 07/21/2018 showed severe triple-vessel disease with 80% proximal RCA, 80% mid RCA, 90% ostial RPDA, 80% proximal to mid left circumflex, 90% proximal LAD, 75% ostial D1, 75% mid LAD lesion.  Cardiac index 2.68.  Patient was seen by CT surgery, however was turned down.  It was recommended to manage his coronary artery disease medically.  Patient presents today for cardiology office visit.  He is a  extremely complicated case.  Since discharge, his weight has trended up from 137 pounds to 167 pounds today.  He has gained roughly 30 pounds since discharge.  He is clearly volume overloaded on physical exam with anasarca in both his hand and lower extremity.  However he denies any obvious PND.  He has been placed on 40 mg daily of Lasix for the past week.  I recommend he increase the Lasix to 40 mg twice daily.  He is currently on oxygen at [facility] skilled nursing facility in [place].  I will also switch his ramipril to [facility].  To switch him from ACE inhibitor to [facility] will require 36-hour washout period.  He will stop the ramipril today and is started on Entresto as this coming Thursday on 08/14/2018.  I plan to bring the patient in 1 week for reassessment and potentially add spironolactone, at that time if his blood pressure is stable I will also consolidate his metoprolol tartrate to metoprolol succinate.  He is currently on aspirin only, however given the persistent atrial fibrillation, we will need to think about starting him on oral anticoagulation therapy.  I will obtain a CBC today.  If he is hemoglobin stable, will need to consider starting him on systemic anticoagulation therapy and potentially switch aspirin to Plavix given his recent angiography in the lower extremity stenting.  This will be addressed on the next office visit.  He will also need a complete metabolic panel today to look at his renal function, electrolytes  and albumin level.  He is extremely frail and I would not be too surprised if his albumin is low.  When he returns I will repeat another basic metabolic panel.  If he continued to gain weight or has worsening swelling, he may require readmission for acute on chronic combined systolic and diastolic heart failure.  Otherwise since discharge, he denies any obvious chest pain.   Past Medical History:  Diagnosis Date  . COPD (chronic obstructive  pulmonary disease) (HCC)    not on home O2  . Diabetes mellitus without complication ([place])   . Hypertension   . PVD (peripheral vascular disease) ([place])    s/p L BKA    Past Surgical History:  Procedure Laterality Date  . ABDOMINAL AORTOGRAM W/LOWER EXTREMITY N/A 07/18/2018   Procedure: ABDOMINAL AORTOGRAM W/LOWER EXTREMITY;  Surgeon: Angelia Mould, MD;  Location: [place] [facility];  Service: Cardiovascular;  Laterality: N/A;  . BELOW KNEE LEG AMPUTATION    . PERIPHERAL VASCULAR INTERVENTION Right 07/18/2018   Procedure: PERIPHERAL VASCULAR INTERVENTION;  Surgeon: Angelia Mould, MD;  Location: [place] [facility];  Service: Cardiovascular;  Laterality: Right;  . RIGHT/LEFT HEART CATH AND CORONARY ANGIOGRAPHY N/A 07/21/2018   Procedure: RIGHT/LEFT HEART CATH AND CORONARY ANGIOGRAPHY;  Surgeon: Jettie Booze, MD;  Location: [place] [facility];  Service: Cardiovascular;  Laterality: N/A;    Current Medications: Outpatient Medications Prior to Visit  Medication Sig Dispense Refill  . aspirin 81 MG tablet Take 81 mg by mouth daily.    Marland Kitchen atorvastatin (LIPITOR) 40 MG tablet Take 1 tablet (40 mg total) by mouth daily.    . budesonide-formoterol (SYMBICORT) 160-4.5 MCG/ACT inhaler Inhale 2 puffs into the lungs 2 (two) times daily.    . feeding supplement, GLUCERNA SHAKE, (GLUCERNA SHAKE) LIQD Take 237 mLs by mouth 2 (two) times daily between meals.  0  . glipiZIDE (GLUCOTROL XL) 5 MG 24 hr tablet Take 5 mg by mouth daily.    . metFORMIN (GLUCOPHAGE) 1000 MG tablet Take 1,000 mg by mouth 2 (two) times daily.    . metoprolol tartrate (LOPRESSOR) 25 MG tablet Take 1 tablet (25 mg total) by mouth 2 (two) times daily.    . Multiple Vitamin (MULTIVITAMIN WITH MINERALS) TABS tablet Take 1 tablet by mouth daily.    . traMADol (ULTRAM) 50 MG tablet Take 50 mg by mouth every 6 (six) hours as needed for moderate pain.     . furosemide (LASIX) 40 MG tablet Take 40 mg by mouth daily.  For edema    . ramipril (ALTACE) 5 MG capsule Take 5 mg by mouth daily.      No facility-administered medications prior to visit.      Allergies:   Penicillins   Social History   Socioeconomic History  . Marital status: Divorced    Spouse name: Not on file  . Number of children: Not on file  . Years of education: Not on file  . Highest education level: Not on file  Occupational History  . Occupation: retired  Scientific laboratory technician  . Financial resource strain: Not on file  . Food insecurity:    Worry: Not on file    Inability: Not on file  . Transportation needs:    Medical: Not on file    Non-medical: Not on file  Tobacco Use  . Smoking status: Former Smoker    Packs/day: 1.00    Years: 60.00    Pack years: 60.00    Last  attempt to quit: 12/2017    Years since quitting: 0.6  . Smokeless tobacco: Never Used  Substance and Sexual Activity  . Alcohol use: Not Currently  . Drug use: Never  . Sexual activity: Not on file  Lifestyle  . Physical activity:    Days per week: Not on file    Minutes per session: Not on file  . Stress: Not on file  Relationships  . Social connections:    Talks on phone: Not on file    Gets together: Not on file    Attends religious service: Not on file    Active member of club or organization: Not on file    Attends meetings of clubs or organizations: Not on file    Relationship status: Not on file  Other Topics Concern  . Not on file  Social History Narrative  . Not on file     Family History:  The patient's family history includes Diabetes in his father.   ROS:   Please see the history of present illness.    ROS All other systems reviewed and are negative.   PHYSICAL EXAM:   VS:  BP (!) 153/79   Pulse 78   Ht 6' (1.829 m)   Wt 167 lb (75.8 kg)   BMI 22.65 kg/m    GEN: Well nourished, well developed, in no acute distress  HEENT: normal  Neck: no JVD, carotid bruits, or masses Cardiac: irregularly irregular; no murmurs, rubs, or  gallops. 2+ edema  Respiratory:  Markedly diminished breath sound in bilateral bases GI: soft, nontender, nondistended, + BS MS: L BKA Skin: warm and dry, no rash Neuro:  Alert and Oriented x 3, Strength and sensation are intact Psych: euthymic mood, full affect  Wt Readings from Last 3 Encounters:  08/12/18 167 lb (75.8 kg)  07/21/18 137 lb 5.6 oz (62.3 kg)      Studies/Labs Reviewed:   EKG:  EKG is ordered today.  The ekg ordered today demonstrates atrial fibrillation with heart rate of 78.  Recent Labs: 07/15/2018: ALT 19 07/22/2018: Magnesium 1.5 07/23/2018: BUN 18; Creatinine, Ser 0.91; Hemoglobin 9.2; Platelets 123; Potassium 3.9; Sodium 139   Lipid Panel No results found for: CHOL, TRIG, HDL, CHOLHDL, VLDL, LDLCALC, LDLDIRECT  Additional studies/ records that were reviewed today include:   E ho 07/17/2018 LV EF: 15% -   20% Study Conclusions  - Left ventricle: The cavity size was mildly dilated. Systolic   function was severely reduced. The estimated ejection fraction   was in the range of 15% to 20%. Diffuse hypokinesis. Features are   consistent with a pseudonormal left ventricular filling pattern,   with concomitant abnormal relaxation and increased filling   pressure (grade 2 diastolic dysfunction). - Aortic valve: Trileaflet; mildly thickened, mildly calcified   leaflets. - Mitral valve: There was mild regurgitation. - Left atrium: The atrium was severely dilated. Volume/bsa, ES   (1-plane Simpson&'s, A4C): 69.5 ml/m^2. - Right atrium: The atrium was severely dilated. - Tricuspid valve: There was severe regurgitation. - Pulmonary arteries: Systolic pressure was moderately increased.   PA peak pressure: 47 mm Hg (S). - Pericardium, extracardiac: There was a left pleural effusion.    Cath 07/21/2018  Prox RCA lesion is 80% stenosed.  Mid RCA lesion is 80% stenosed.  Ost RPDA to RPDA lesion is 90% stenosed.  Prox Cx to Mid Cx lesion is 80%  stenosed.  Prox LAD lesion is 90% stenosed.  Ost 1st Diag lesion is 75%  stenosed.  Mid LAD lesion is 75% stenosed.  LV end diastolic pressure is normal.  There is no aortic valve stenosis.  Hemodynamic findings consistent with mild pulmonary hypertension.  Ao sat 90%, PA 58%, PA pressure 54/19, mean PA 32 mm Hg; PCWP 16/17; mean PCWP 14 mm Hg; CO 4.7 L/min; CI 2.68   Severe three vessel disease in the setting of low EF.  Plan for cardiac surgery consult.  If he is not a candidate for CABG, would have to consider medical therapy vs. Atherectomy of LAD.   Plavix on hold currently in the event of cardiac surgery.  Would need to restart depending on plan of treatment.    Restart heparin in 8 hours post sheath pull.   ASSESSMENT:    1. Acute on chronic combined systolic and diastolic CHF (congestive heart failure) ([place])   2. Coronary artery disease involving coronary bypass graft of native heart without angina pectoris   3. PAD (peripheral artery disease) ([place])   4. Hx of BKA, left ([place])   5. Essential hypertension   6. Controlled type 2 diabetes mellitus without complication, without long-term current use of insulin ([place])   7. Chronic obstructive pulmonary disease, unspecified COPD type ([place])   8. Persistent atrial fibrillation   9. Ischemic cardiomyopathy   10. Physical deconditioning      PLAN:  In order of problems listed above:  1. Acute on chronic combined systolic and diastolic heart failure: Patient has gained roughly 30 pounds since discharge.  He has significant right lower extremity edema and upper extremity edema as well.  I will obtain complete metabolic panel to look at renal function, electrolyte and albumin.  With the degree of anasarca, I would not be surprised if his albumin is low.  I will increase his diuretic to 40 mg twice daily of Lasix.  He will need repeat basic metabolic panel in 1 week when he returned to follow-up with me.    2. Ischemic  cardiomyopathy: I will continue on the current beta-blocker, I plan to switch his ramipril to Entresto 49-51 mg twice daily.  This will require a 36-hour washout period.  Since his last ramipril was this morning.  The earliest day he can start on Entresto 49 to 51 mg twice daily will be this Thursday morning.  When he returns, he will need a basic metabolic panel, and I will also plan to consolidate his metoprolol to Toprol-XL.  I would consider adding on spironolactone as well for his LV dysfunction.  3. Persistent atrial fibrillation: He remains in atrial fibrillation at this time.  He is currently on aspirin, systemic anticoagulation was not placed due to concern for bleeding and bruising.  I will obtain a CBC, this will need to be readdressed on follow-up to potentially consider either Eliquis or Xarelto. Otherwise his heart rate is controlled on the current beta-blocker  4. COPD: He has been placed on oxygen.   5. Deconditioning: He will require skilled help given multiple issues including severe coronary artery disease on medical therapy and acute heart failure.  6. PAD: He recently underwent right lower extremity arterial stenting by a vascular surgery.  Currently on aspirin.  Plavix was taken off due to concern of bleeding.  7. DM2: Managed by primary care provider    Medication Adjustments/Labs and Tests Ordered: Current medicines are reviewed at length with the patient today.  Concerns regarding medicines are outlined above.  Medication changes, Labs and Tests ordered today are listed in the  Patient Instructions below. Patient Instructions  Heart Failure Management: 1. Avoid salt 2. Limit daily fluid intake to 32-64 oz 3. Weigh yourself daily, call cardiology if weight increase by more than 3 lbs over night or 5 lbs in a single week.  Medication Instructions:  Increase Lasix to 40 mg twice daily. DISCONTINUE Ramipiril 5 mg tablet today.  START Entresto 49-51 twice daily on  THURSDAY If you need a refill on your cardiac medications before your next appointment, please call your pharmacy.   Lab work: CBC, CMP REPEAT BMET IN 1 WEEK. If you have labs (blood work) drawn today and your tests are completely normal, you will receive your results only by: Marland Kitchen MyChart Message (if you have MyChart) OR . A paper copy in the mail If you have any lab test that is abnormal or we need to change your treatment, we will call you to review the results.  Testing/Procedures: NONE ORDERED.  Follow-Up: At [facility] you and your health needs are our priority.  As part of our continuing mission to provide you with exceptional heart care, we have created designated Provider Care Teams.  These Care Teams include your primary Cardiologist (physician) and Advanced Practice Providers (APPs -  Physician Assistants and Nurse Practitioners) who all work together to provide you with the care you need, when you need it. Marland Kitchen KEEP APPOINTMENT WITH Denise Bramblett, PA-C ON SCHEDULED APPOINTMENT.  Any Other Special Instructions Will Be Listed Below (If Applicable). COME HAVE LABS DRAWN AT SAME TIME AS VISIT NEXT WEEK.       Hilbert Corrigan, [place]  08/12/2018 11:54 AM    [address], [place],   [address] Phone: [phone]; Fax: [phone]

## 2018-08-13 ENCOUNTER — Telehealth: Payer: Self-pay | Admitting: Physician Assistant

## 2018-08-13 LAB — COMPREHENSIVE METABOLIC PANEL
A/G RATIO: 1.1 — AB (ref 1.2–2.2)
ALK PHOS: 111 IU/L (ref 39–117)
ALT: 16 IU/L (ref 0–44)
AST: 21 IU/L (ref 0–40)
Albumin: 3.4 g/dL — ABNORMAL LOW (ref 3.5–4.8)
BUN/Creatinine Ratio: 31 — ABNORMAL HIGH (ref 10–24)
BUN: 30 mg/dL — ABNORMAL HIGH (ref 8–27)
Bilirubin Total: 0.4 mg/dL (ref 0.0–1.2)
CO2: 25 mmol/L (ref 20–29)
Calcium: 8.5 mg/dL — ABNORMAL LOW (ref 8.6–10.2)
Chloride: 103 mmol/L (ref 96–106)
Creatinine, Ser: 0.98 mg/dL (ref 0.76–1.27)
GFR calc Af Amer: 87 mL/min/{1.73_m2} (ref >59)
GFR, EST NON AFRICAN AMERICAN: 76 mL/min/{1.73_m2} (ref >59)
Globulin, Total: 3.1 g/dL (ref 1.5–4.5)
Glucose: 82 mg/dL (ref 65–99)
POTASSIUM: 4.6 mmol/L (ref 3.5–5.2)
SODIUM: 145 mmol/L — AB (ref 134–144)
Total Protein: 6.5 g/dL (ref 6.0–8.5)

## 2018-08-13 LAB — CBC
Hematocrit: 30.8 % — ABNORMAL LOW (ref 37.5–51.0)
Hemoglobin: 9.8 g/dL — ABNORMAL LOW (ref 13.0–17.7)
MCH: 28.5 pg (ref 26.6–33.0)
MCHC: 31.8 g/dL (ref 31.5–35.7)
MCV: 90 fL (ref 79–97)
Platelets: 118 10*3/uL — ABNORMAL LOW (ref 150–450)
RBC: 3.44 x10E6/uL — ABNORMAL LOW (ref 4.14–5.80)
RDW: 16.4 % — AB (ref 12.3–15.4)
WBC: 5.5 10*3/uL (ref 3.4–10.8)

## 2018-08-13 NOTE — Progress Notes (Signed)
Anemia stable, renal function and electrolyte stable. Albumin level was borderline low. Previous plan unchanged.

## 2018-08-13 NOTE — Telephone Encounter (Signed)
[facility] is would like for patient's lab results to be faxed to them.  We were disconnected before I could get a fax number.

## 2018-08-14 NOTE — Telephone Encounter (Signed)
Called rehab back, received fax number  [phone] Attn to rehab nurse. Sent fax via epic for lab work.

## 2018-08-21 ENCOUNTER — Other Ambulatory Visit: Payer: Self-pay

## 2018-08-21 ENCOUNTER — Encounter: Payer: Self-pay | Admitting: Physician Assistant

## 2018-08-21 ENCOUNTER — Ambulatory Visit (INDEPENDENT_AMBULATORY_CARE_PROVIDER_SITE_OTHER): Payer: Medicare Other | Admitting: Physician Assistant

## 2018-08-21 VITALS — BP 140/62 | HR 70 | Ht 71.0 in | Wt 168.0 lb

## 2018-08-21 DIAGNOSIS — J449 Chronic obstructive pulmonary disease, unspecified: Secondary | ICD-10-CM

## 2018-08-21 DIAGNOSIS — I4819 Other persistent atrial fibrillation: Secondary | ICD-10-CM

## 2018-08-21 DIAGNOSIS — I739 Peripheral vascular disease, unspecified: Secondary | ICD-10-CM

## 2018-08-21 DIAGNOSIS — E119 Type 2 diabetes mellitus without complications: Secondary | ICD-10-CM

## 2018-08-21 DIAGNOSIS — D649 Anemia, unspecified: Secondary | ICD-10-CM | POA: Diagnosis not present

## 2018-08-21 DIAGNOSIS — Z89512 Acquired absence of left leg below knee: Secondary | ICD-10-CM

## 2018-08-21 DIAGNOSIS — I255 Ischemic cardiomyopathy: Secondary | ICD-10-CM

## 2018-08-21 DIAGNOSIS — I2510 Atherosclerotic heart disease of native coronary artery without angina pectoris: Secondary | ICD-10-CM

## 2018-08-21 DIAGNOSIS — I5043 Acute on chronic combined systolic (congestive) and diastolic (congestive) heart failure: Secondary | ICD-10-CM | POA: Diagnosis not present

## 2018-08-21 DIAGNOSIS — I10 Essential (primary) hypertension: Secondary | ICD-10-CM

## 2018-08-21 DIAGNOSIS — I25810 Atherosclerosis of coronary artery bypass graft(s) without angina pectoris: Secondary | ICD-10-CM

## 2018-08-21 LAB — BASIC METABOLIC PANEL
BUN/Creatinine Ratio: 30 — ABNORMAL HIGH (ref 10–24)
BUN: 27 mg/dL (ref 8–27)
CHLORIDE: 104 mmol/L (ref 96–106)
CO2: 26 mmol/L (ref 20–29)
Calcium: 8.7 mg/dL (ref 8.6–10.2)
Creatinine, Ser: 0.89 mg/dL (ref 0.76–1.27)
GFR calc Af Amer: 97 mL/min/{1.73_m2} (ref >59)
GFR, EST NON AFRICAN AMERICAN: 84 mL/min/{1.73_m2} (ref >59)
GLUCOSE: 113 mg/dL — AB (ref 65–99)
POTASSIUM: 4.2 mmol/L (ref 3.5–5.2)
SODIUM: 144 mmol/L (ref 134–144)

## 2018-08-21 MED ORDER — SPIRONOLACTONE 25 MG PO TABS: 25.0000 mg | ORAL_TABLET | Freq: Every day | ORAL | 3 refills | Status: DC

## 2018-08-21 MED ORDER — METOPROLOL SUCCINATE ER 50 MG PO TB24: 50.0000 mg | ORAL_TABLET | Freq: Every day | ORAL | 3 refills | Status: AC

## 2018-08-21 MED ORDER — FUROSEMIDE 40 MG PO TABS: ORAL_TABLET | ORAL | 3 refills | Status: AC

## 2018-08-21 NOTE — Progress Notes (Signed)
Cardiology Office Note    Date:  08/23/2018   ID:  Derek Hinton, DOB [birth year], MRN [id number]  PCP:  Sheral Apley, MD  Cardiologist:  Dr. Sallyanne Kuster  Chief Complaint  Patient presents with  . Follow-up    seen for Dr. Sallyanne Kuster. Heart Failure Meds titration    History of Present Illness:  Derek Hinton is a 72 y.o. male with PMH of PVD s/p L BKA, tobacco abuse, HTN, DM II, and COPD.  Patient recently presented to hospital after being found down in his home confused state.  On arrival to [facility], he was noted to be atrial fibrillation and that there was also some concern of right lower extremity ischemia.  Head CT was negative for acute issue, however does show chronic ischemic microvascular white matter disease and a small remote left thalamic lacunar infarct.  Vascular surgery was consulted for possible critical limb ischemia.  Patient was seen by Dr. Donnetta Hutching who does not feel there was profound ischemia to warrant emergent intervention.  Lower extremity arterial Doppler shows 75 to 99% stenosis in the right proximal to mid superficial femoral artery.  Echocardiogram obtained during this admission also showed severely reduced LVEF at 15 to 20%, severe TR, mild MR, grade 2 DD, PA peak pressure 47 mmHg.  He underwent lower extremity PV angiogram and had successful angioplasty and stenting of right external iliac artery.  He was initially placed on aspirin and Plavix, however Plavix was taken off in the hospital due to significant bleeding and bruising.  Cardiac catheterization performed on 07/21/2018 showed severe triple-vessel disease with 80% proximal RCA, 80% mid RCA, 90% ostial RPDA, 80% proximal to mid left circumflex, 90% proximal LAD, 75% ostial D1, 75% mid LAD lesion.  Cardiac index 2.68.  Patient was seen by CT surgery, however was turned down.  It was recommended to manage his coronary artery disease medically.  I last saw the patient on 08/12/2018, his weight has  increased from baseline of 137 pounds to 167 pounds.  He gained roughly 30 pounds after discharge.  I increased his Lasix to 40 mg twice daily from 40 mg daily.  I switched his ramipril to [facility] after a 36-hour washout period.  Patient presents today for cardiology office visit.  His weight is unchanged after increasing the diuretic.  Continue to have lower extremity edema.  Given his LV dysfunction.  I plan to increase his diuretic to 80 mg a.m. and 40 mg p.m.  I will also add on spironolactone 25 mg daily and to consolidate his metoprolol tartrate to metoprolol succinate.  I plan to obtain CBC and basic metabolic panel in 1 week and bring the patient back in 2 weeks for reassessment.  Once we finish titrating his heart failure medication, I plan to repeat echocardiogram in 3 months.    Past Medical History:  Diagnosis Date  . COPD (chronic obstructive pulmonary disease) (HCC)    not on home O2  . Diabetes mellitus without complication ([place])   . Hypertension   . PVD (peripheral vascular disease) ([place])    s/p L BKA    Past Surgical History:  Procedure Laterality Date  . ABDOMINAL AORTOGRAM W/LOWER EXTREMITY N/A 07/18/2018   Procedure: ABDOMINAL AORTOGRAM W/LOWER EXTREMITY;  Surgeon: Angelia Mould, MD;  Location: [place] [facility];  Service: Cardiovascular;  Laterality: N/A;  . BELOW KNEE LEG AMPUTATION    . PERIPHERAL VASCULAR INTERVENTION Right 07/18/2018   Procedure: PERIPHERAL VASCULAR INTERVENTION;  Surgeon: Deitra Mayo  S, MD;  Location: [place] [facility];  Service: Cardiovascular;  Laterality: Right;  . RIGHT/LEFT HEART CATH AND CORONARY ANGIOGRAPHY N/A 07/21/2018   Procedure: RIGHT/LEFT HEART CATH AND CORONARY ANGIOGRAPHY;  Surgeon: Jettie Booze, MD;  Location: [place] [facility];  Service: Cardiovascular;  Laterality: N/A;    Current Medications: Outpatient Medications Prior to Visit  Medication Sig Dispense Refill  . aspirin 81 MG tablet Take 81 mg  by mouth daily.    Marland Kitchen atorvastatin (LIPITOR) 40 MG tablet Take 1 tablet (40 mg total) by mouth daily.    . feeding supplement, GLUCERNA SHAKE, (GLUCERNA SHAKE) LIQD Take 237 mLs by mouth 2 (two) times daily between meals.  0  . glipiZIDE (GLUCOTROL XL) 5 MG 24 hr tablet Take 5 mg by mouth daily.    . metFORMIN (GLUCOPHAGE) 1000 MG tablet Take 1,000 mg by mouth 2 (two) times daily.    . mometasone-formoterol (DULERA) 200-5 MCG/ACT AERO Inhale 2 puffs into the lungs 2 (two) times daily.    . Multiple Vitamin (MULTIVITAMIN WITH MINERALS) TABS tablet Take 1 tablet by mouth daily.    . sacubitril-valsartan (ENTRESTO) 49-51 MG Take 1 tablet by mouth 2 (two) times daily. 60 tablet 3  . traMADol (ULTRAM) 50 MG tablet Take 50 mg by mouth every 6 (six) hours as needed for moderate pain.     . furosemide (LASIX) 40 MG tablet Take 1 tablet (40 mg total) by mouth 2 (two) times daily. For edema 60 tablet 3  . metoprolol tartrate (LOPRESSOR) 25 MG tablet Take 1 tablet (25 mg total) by mouth 2 (two) times daily.    . budesonide-formoterol (SYMBICORT) 160-4.5 MCG/ACT inhaler Inhale 2 puffs into the lungs 2 (two) times daily.     No facility-administered medications prior to visit.      Allergies:   Penicillins   Social History   Socioeconomic History  . Marital status: Divorced    Spouse name: Not on file  . Number of children: Not on file  . Years of education: Not on file  . Highest education level: Not on file  Occupational History  . Occupation: retired  Scientific laboratory technician  . Financial resource strain: Not on file  . Food insecurity:    Worry: Not on file    Inability: Not on file  . Transportation needs:    Medical: Not on file    Non-medical: Not on file  Tobacco Use  . Smoking status: Former Smoker    Packs/day: 1.00    Years: 60.00    Pack years: 60.00    Last attempt to quit: 12/2017    Years since quitting: 0.7  . Smokeless tobacco: Never Used  Substance and Sexual Activity  . Alcohol  use: Not Currently  . Drug use: Never  . Sexual activity: Not on file  Lifestyle  . Physical activity:    Days per week: Not on file    Minutes per session: Not on file  . Stress: Not on file  Relationships  . Social connections:    Talks on phone: Not on file    Gets together: Not on file    Attends religious service: Not on file    Active member of club or organization: Not on file    Attends meetings of clubs or organizations: Not on file    Relationship status: Not on file  Other Topics Concern  . Not on file  Social History Narrative  . Not on file  Family History:  The patient's family history includes Diabetes in his father.   ROS:   Please see the history of present illness.    ROS All other systems reviewed and are negative.   PHYSICAL EXAM:   VS:  BP 140/62   Pulse 70   Ht 5\' 11"  (1.803 m)   Wt 168 lb (76.2 kg)   SpO2 100%   BMI 23.43 kg/m    GEN: Well nourished, well developed, in no acute distress  HEENT: normal  Neck: no JVD, carotid bruits, or masses Cardiac: RRR; no murmurs, rubs, or gallops. 1-2+ pitting edema  Respiratory:  clear to auscultation bilaterally, normal work of breathing GI: soft, nontender, nondistended, + BS MS: no deformity or atrophy  Skin: warm and dry, no rash Neuro:  Alert and Oriented x 3, Strength and sensation are intact Psych: euthymic mood, full affect  Wt Readings from Last 3 Encounters:  08/21/18 168 lb (76.2 kg)  08/12/18 167 lb (75.8 kg)  07/21/18 137 lb 5.6 oz (62.3 kg)      Studies/Labs Reviewed:   EKG:  EKG is not ordered today.    Recent Labs: 07/22/2018: Magnesium 1.5 08/12/2018: ALT 16; Hemoglobin 9.8; Platelets 118 08/21/2018: BUN 27; Creatinine, Ser 0.89; Potassium 4.2; Sodium 144   Lipid Panel No results found for: CHOL, TRIG, HDL, CHOLHDL, VLDL, LDLCALC, LDLDIRECT  Additional studies/ records that were reviewed today include:   Echo 07/17/2018 LV EF: 15% - 20% Study Conclusions  - Left  ventricle: The cavity size was mildly dilated. Systolic function was severely reduced. The estimated ejection fraction was in the range of 15% to 20%. Diffuse hypokinesis. Features are consistent with a pseudonormal left ventricular filling pattern, with concomitant abnormal relaxation and increased filling pressure (grade 2 diastolic dysfunction). - Aortic valve: Trileaflet; mildly thickened, mildly calcified leaflets. - Mitral valve: There was mild regurgitation. - Left atrium: The atrium was severely dilated. Volume/bsa, ES (1-plane Simpson&'s, A4C): 69.5 ml/m^2. - Right atrium: The atrium was severely dilated. - Tricuspid valve: There was severe regurgitation. - Pulmonary arteries: Systolic pressure was moderately increased. PA peak pressure: 47 mm Hg (S). - Pericardium, extracardiac: There was a left pleural effusion.    Cath 07/21/2018  Prox RCA lesion is 80% stenosed.  Mid RCA lesion is 80% stenosed.  Ost RPDA to RPDA lesion is 90% stenosed.  Prox Cx to Mid Cx lesion is 80% stenosed.  Prox LAD lesion is 90% stenosed.  Ost 1st Diag lesion is 75% stenosed.  Mid LAD lesion is 75% stenosed.  LV end diastolic pressure is normal.  There is no aortic valve stenosis.  Hemodynamic findings consistent with mild pulmonary hypertension.  Ao sat 90%, PA 58%, PA pressure 54/19, mean PA 32 mm Hg; PCWP 16/17; mean PCWP 14 mm Hg; CO 4.7 L/min; CI 2.68  Severe three vessel disease in the setting of low EF. Plan for cardiac surgery consult. If he is not a candidate for CABG, would have to consider medical therapy vs. Atherectomy of LAD.   Plavix on hold currently in the event of cardiac surgery. Would need to restart depending on plan of treatment.   Restart heparin in 8 hours post sheath pull.    ASSESSMENT:    1. Acute on chronic combined systolic and diastolic CHF (congestive heart failure) ([place])   2. Anemia, unspecified type   3. PAD  (peripheral artery disease) ([place])   4. Hx of BKA, left ([place])   5. Essential hypertension   6.  Controlled type 2 diabetes mellitus without complication, without long-term current use of insulin ([place])   7. Coronary artery disease involving native coronary artery of native heart without angina pectoris      PLAN:  In order of problems listed above:  1. Acute on chronic combined systolic and diastolic heart failure: EF 15%.  He has gained roughly 30 pounds since discharge.  I increased his diuretic to 40 mg twice daily during the previous office visit.  This did not significantly change his weight.  I will add spironolactone 25 mg daily and increase his Lasix to 80 mg a.m. and 40 mg p.m.  He will need 1 week basic metabolic panel before follow-up with me in 2 weeks.  For his LV dysfunction I also consolidated his metoprolol tartrate to metoprolol succinate 50 mg daily  2. Anemia: We will follow-up with CBC  3. PAD: Followed by vascular surgery.  History of left BKA.  4. CAD: Patient has known severe native artery disease, however he was turned down by CT surgery for CABG.  Given lack of chest discomfort, it was decided to manage his symptom medically  5. DM2: Managed by primary care provider.  6. Hypertension: Blood pressure borderline elevated.  Add spironolactone and consolidate metoprolol.    Medication Adjustments/Labs and Tests Ordered: Current medicines are reviewed at length with the patient today.  Concerns regarding medicines are outlined above.  Medication changes, Labs and Tests ordered today are listed in the Patient Instructions below. Patient Instructions  Medication Instructions:  START Metoprolol Succinate 50mg  Take 1 tablet daily  START Spironolactone 25mg  take 1 tablet once a day INCREASE Lasix to 80mg  in the morning and 40mg  in the pm  STOP Metoprolol Tartrate   If you need a refill on your cardiac medications before your next appointment, please call your pharmacy.     Lab work: Your physician recommends that you return for lab work in: [place], BMET If you have labs (blood work) drawn today and your tests are completely normal, you will receive your results only by: Marland Kitchen MyChart Message (if you have MyChart) OR . A paper copy in the mail If you have any lab test that is abnormal or we need to change your treatment, we will call you to review the results.  Testing/Procedures: NONE  Follow-Up: At [facility], you and your health needs are our priority.  As part of our continuing mission to provide you with exceptional heart care, we have created designated Provider Care Teams.  These Care Teams include your primary Cardiologist (physician) and Advanced Practice Providers (APPs -  Physician Assistants and Nurse Practitioners) who all work together to provide you with the care you need, when you need it. Your physician recommends that you schedule a follow-up appointment in: 2 [place] 2-3 MONTHS WITH DR Sallyanne Kuster   Any Other Special Instructions Will Be Listed Below (If Applicable).      Hilbert Corrigan, [place]  08/23/2018 10:59 PM    [facility] [address], [place], [place]  [address] Phone: [phone]; Fax: [phone]

## 2018-08-21 NOTE — Patient Instructions (Addendum)
Medication Instructions:  START Metoprolol Succinate 50mg  Take 1 tablet daily  START Spironolactone 25mg  take 1 tablet once a day INCREASE Lasix to 80mg  in the morning and 40mg  in the pm  STOP Metoprolol Tartrate   If you need a refill on your cardiac medications before your next appointment, please call your pharmacy.   Lab work: Your physician recommends that you return for lab work in: [place], BMET If you have labs (blood work) drawn today and your tests are completely normal, you will receive your results only by: Marland Kitchen MyChart Message (if you have MyChart) OR . A paper copy in the mail If you have any lab test that is abnormal or we need to change your treatment, we will call you to review the results.  Testing/Procedures: NONE  Follow-Up: At [facility], you and your health needs are our priority.  As part of our continuing mission to provide you with exceptional heart care, we have created designated Provider Care Teams.  These Care Teams include your primary Cardiologist (physician) and Advanced Practice Providers (APPs -  Physician Assistants and Nurse Practitioners) who all work together to provide you with the care you need, when you need it. Your physician recommends that you schedule a follow-up appointment in: 2 [place] 2-3 MONTHS WITH DR Sallyanne Kuster   Any Other Special Instructions Will Be Listed Below (If Applicable).

## 2018-08-22 DIAGNOSIS — E1165 Type 2 diabetes mellitus with hyperglycemia: Secondary | ICD-10-CM | POA: Diagnosis not present

## 2018-08-22 DIAGNOSIS — I4891 Unspecified atrial fibrillation: Secondary | ICD-10-CM | POA: Diagnosis not present

## 2018-08-22 DIAGNOSIS — G9340 Encephalopathy, unspecified: Secondary | ICD-10-CM | POA: Diagnosis not present

## 2018-08-22 DIAGNOSIS — C4371 Malignant melanoma of right lower limb, including hip: Secondary | ICD-10-CM | POA: Diagnosis not present

## 2018-08-22 DIAGNOSIS — I10 Essential (primary) hypertension: Secondary | ICD-10-CM | POA: Diagnosis not present

## 2018-08-23 ENCOUNTER — Encounter: Payer: Self-pay | Admitting: Physician Assistant

## 2018-08-25 ENCOUNTER — Encounter: Payer: Medicare Other | Admitting: Vascular Surgery

## 2018-08-28 ENCOUNTER — Encounter: Payer: Self-pay | Admitting: Physician Assistant

## 2018-08-30 DIAGNOSIS — E1165 Type 2 diabetes mellitus with hyperglycemia: Secondary | ICD-10-CM | POA: Diagnosis not present

## 2018-08-30 DIAGNOSIS — I4891 Unspecified atrial fibrillation: Secondary | ICD-10-CM | POA: Diagnosis not present

## 2018-08-30 DIAGNOSIS — D61818 Other pancytopenia: Secondary | ICD-10-CM | POA: Diagnosis not present

## 2018-08-30 DIAGNOSIS — I10 Essential (primary) hypertension: Secondary | ICD-10-CM | POA: Diagnosis not present

## 2018-09-02 ENCOUNTER — Telehealth: Payer: Self-pay | Admitting: Cardiovascular Disease

## 2018-09-02 DIAGNOSIS — C44722 Squamous cell carcinoma of skin of right lower limb, including hip: Secondary | ICD-10-CM | POA: Diagnosis not present

## 2018-09-02 DIAGNOSIS — L578 Other skin changes due to chronic exposure to nonionizing radiation: Secondary | ICD-10-CM | POA: Diagnosis not present

## 2018-09-02 NOTE — Telephone Encounter (Signed)
LMTCB... Caryl Pina with  [facility] not on our [place] wanted to talk with the pt first for approval.. Pt has appt with Almyra Deforest PA on 09/03/18.

## 2018-09-02 NOTE — Telephone Encounter (Signed)
New message    Pt c/o swelling: STAT is pt has developed SOB within 24 hours  1) How much weight have you gained and in what time span? 164 yesterday 169 today 5 lbs   2) If swelling, where is the swelling located? Per Caryl Pina, no more than normal.  3) Are you currently taking a fluid pill? Yes, lasix.  4) Are you currently SOB? Per Caryl Pina at [facility], yes. She said his O2 was 90.  5) Do you have a log of your daily weights (if so, list)? 21st- 165.8 23rd-164.2 24th- 166Mon-164 Tues-169  6) Have you gained 3 pounds in a day or 5 pounds in a week? Yes   7) Have you traveled recently? Per Caryl Pina, pt went to a dermatologist appt this morning.

## 2018-09-03 ENCOUNTER — Ambulatory Visit (INDEPENDENT_AMBULATORY_CARE_PROVIDER_SITE_OTHER): Payer: Medicare Other | Admitting: Physician Assistant

## 2018-09-03 ENCOUNTER — Telehealth: Payer: Self-pay

## 2018-09-03 ENCOUNTER — Encounter: Payer: Self-pay | Admitting: Physician Assistant

## 2018-09-03 VITALS — BP 118/78 | HR 81 | Ht 71.0 in | Wt 167.0 lb

## 2018-09-03 DIAGNOSIS — I10 Essential (primary) hypertension: Secondary | ICD-10-CM | POA: Diagnosis not present

## 2018-09-03 DIAGNOSIS — I5022 Chronic systolic (congestive) heart failure: Secondary | ICD-10-CM

## 2018-09-03 DIAGNOSIS — Z89512 Acquired absence of left leg below knee: Secondary | ICD-10-CM

## 2018-09-03 DIAGNOSIS — I2510 Atherosclerotic heart disease of native coronary artery without angina pectoris: Secondary | ICD-10-CM | POA: Diagnosis not present

## 2018-09-03 DIAGNOSIS — I255 Ischemic cardiomyopathy: Secondary | ICD-10-CM | POA: Diagnosis not present

## 2018-09-03 DIAGNOSIS — E119 Type 2 diabetes mellitus without complications: Secondary | ICD-10-CM | POA: Diagnosis not present

## 2018-09-03 DIAGNOSIS — R600 Localized edema: Secondary | ICD-10-CM

## 2018-09-03 NOTE — Progress Notes (Signed)
Cardiology Office Note    Date:  09/05/2018   ID:  Derek Hinton, DOB 03-01-1946, MRN [id number]  PCP:  Sheral Apley, MD  Cardiologist:  Dr. Sallyanne Kuster  Chief Complaint  Patient presents with  . Follow-up    seen for Dr. Sallyanne Kuster.     History of Present Illness:  Derek Hinton is a 72 y.o. male  with PMH of PVD s/p L BKA, tobacco abuse, HTN, DM II, and COPD.Patient recently presented to hospital after being found down in his home confused state. On arrival to [facility], he was noted to be atrial fibrillation and that there was also some concern of right lower extremity ischemia. Head CT was negative for acute issue,however does show chronic ischemic microvascular white matter disease and a small remote left thalamic lacunar infarct. Vascular surgery was consulted for possible critical limb ischemia. Patient was seen by Dr. Donnetta Hutching who does not feel there was profound ischemia to warrant emergent intervention. Lower extremity arterial Doppler shows 75 to 99% stenosis in the right proximal to mid superficial femoral artery. Echocardiogram obtained during this admission also showed severely reduced LVEF at 15 to 20%, severe TR, mild MR, grade 2 DD, PA peak pressure 47 mmHg. He underwent lower extremity PV angiogram and had successful angioplasty and stenting of right external iliac artery. He was initially placed on aspirin and Plavix, however Plavix was taken off in the hospital due to significant bleeding and bruising. Cardiac catheterization performed on 10/14/2019showed severe triple-vessel disease with 80% proximal RCA, 80% mid RCA, 90% ostial RPDA, 80% proximal to mid left circumflex, 90% proximal LAD, 75% ostial D1, 75% mid LAD lesion. Cardiac index 2.68. Patient was seen by CT surgery, however was turned down. It was recommended to manage his coronary artery disease medically.  I saw the patient on 08/12/2018, his weight increased 30 pounds after discharge.  I  increase his Lasix to 40 mg twice daily from 40 mg daily and also switched ramipril to Entresto after 36-hour washout.  His weight was unchanged on follow-up.  I discussed the case with Dr. Sallyanne Kuster and did decide to increase his diuretic to 80 mg a.m. and 40 mg p.m. and added spironolactone to his medical regimen.  He was supposed to have repeat lab work in 1 week prior to follow-up today, this apparently was done however the result is not available to [place].  At this point, patient continued to have some degree of right lower extremity edema, his weight is unchanged.  It is very questionable that if the previous weight is accurate as I do not think I can decrease his weight by 30 pounds.  His lung is clear.  He has 2+ pitting edema in the right lower extremity.  I recommend to continue on the current dose of diuretic.    Past Medical History:  Diagnosis Date  . COPD (chronic obstructive pulmonary disease) (HCC)    not on home O2  . Diabetes mellitus without complication ([place])   . Hypertension   . PVD (peripheral vascular disease) ([place])    s/p L BKA    Past Surgical History:  Procedure Laterality Date  . ABDOMINAL AORTOGRAM W/LOWER EXTREMITY N/A 07/18/2018   Procedure: ABDOMINAL AORTOGRAM W/LOWER EXTREMITY;  Surgeon: Angelia Mould, MD;  Location: [place] [facility];  Service: Cardiovascular;  Laterality: N/A;  . BELOW KNEE LEG AMPUTATION    . PERIPHERAL VASCULAR INTERVENTION Right 07/18/2018   Procedure: PERIPHERAL VASCULAR INTERVENTION;  Surgeon: Angelia Mould, MD;  Location: [place] [facility];  Service: Cardiovascular;  Laterality: Right;  . RIGHT/LEFT HEART CATH AND CORONARY ANGIOGRAPHY N/A 07/21/2018   Procedure: RIGHT/LEFT HEART CATH AND CORONARY ANGIOGRAPHY;  Surgeon: Jettie Booze, MD;  Location: [place] [facility];  Service: Cardiovascular;  Laterality: N/A;    Current Medications: Outpatient Medications Prior to Visit  Medication Sig Dispense Refill  .  aspirin 81 MG tablet Take 81 mg by mouth daily.    Marland Kitchen atorvastatin (LIPITOR) 40 MG tablet Take 1 tablet (40 mg total) by mouth daily.    Marland Kitchen BREO ELLIPTA 100-25 MCG/INH AEPB Inhale 2 puffs into the lungs as directed.    . feeding supplement, GLUCERNA SHAKE, (GLUCERNA SHAKE) LIQD Take 237 mLs by mouth 2 (two) times daily between meals.  0  . furosemide (LASIX) 40 MG tablet Take 80mg  in the morning and 40mg  in the pm 90 tablet 3  . glipiZIDE (GLUCOTROL XL) 5 MG 24 hr tablet Take 5 mg by mouth daily.    . iron polysaccharides (NIFEREX) 150 MG capsule Take 150 mg by mouth daily.    . metFORMIN (GLUCOPHAGE) 1000 MG tablet Take 1,000 mg by mouth 2 (two) times daily.    . metoprolol succinate (TOPROL-XL) 50 MG 24 hr tablet Take 1 tablet (50 mg total) by mouth daily. Take with or immediately following a meal. 30 tablet 3  . mometasone-formoterol (DULERA) 200-5 MCG/ACT AERO Inhale 2 puffs into the lungs 2 (two) times daily.    . Multiple Vitamin (MULTIVITAMIN WITH MINERALS) TABS tablet Take 1 tablet by mouth daily.    Marland Kitchen omeprazole (PRILOSEC) 20 MG capsule Take 1 capsule by mouth as directed.    . sacubitril-valsartan (ENTRESTO) 49-51 MG Take 1 tablet by mouth 2 (two) times daily. 60 tablet 3  . spironolactone (ALDACTONE) 25 MG tablet Take 1 tablet (25 mg total) by mouth daily. 30 tablet 3  . traMADol (ULTRAM) 50 MG tablet Take 50 mg by mouth every 6 (six) hours as needed for moderate pain.      No facility-administered medications prior to visit.      Allergies:   Penicillins   Social History   Socioeconomic History  . Marital status: Divorced    Spouse name: Not on file  . Number of children: Not on file  . Years of education: Not on file  . Highest education level: Not on file  Occupational History  . Occupation: retired  Scientific laboratory technician  . Financial resource strain: Not on file  . Food insecurity:    Worry: Not on file    Inability: Not on file  . Transportation needs:    Medical: Not on  file    Non-medical: Not on file  Tobacco Use  . Smoking status: Former Smoker    Packs/day: 1.00    Years: 60.00    Pack years: 60.00    Last attempt to quit: 12/2017    Years since quitting: 0.7  . Smokeless tobacco: Never Used  Substance and Sexual Activity  . Alcohol use: Not Currently  . Drug use: Never  . Sexual activity: Not on file  Lifestyle  . Physical activity:    Days per week: Not on file    Minutes per session: Not on file  . Stress: Not on file  Relationships  . Social connections:    Talks on phone: Not on file    Gets together: Not on file    Attends religious service: Not on file  Active member of club or organization: Not on file    Attends meetings of clubs or organizations: Not on file    Relationship status: Not on file  Other Topics Concern  . Not on file  Social History Narrative  . Not on file     Family History:  The patient's family history includes Diabetes in his father.   ROS:   Please see the history of present illness.    ROS All other systems reviewed and are negative.   PHYSICAL EXAM:   VS:  BP 118/78   Pulse 81   Ht 5\' 11"  (1.803 m)   Wt 167 lb (75.8 kg)   SpO2 93%   BMI 23.29 kg/m    GEN: Well nourished, well developed, in no acute distress  HEENT: normal  Neck: no JVD, carotid bruits, or masses Cardiac: RRR; no murmurs, rubs, or gallops. 2+ RLE edema  Respiratory:  clear to auscultation bilaterally, normal work of breathing GI: soft, nontender, nondistended, + BS MS: L BKA Skin: warm and dry, no rash Neuro:  Alert and Oriented x 3, Strength and sensation are intact Psych: euthymic mood, full affect  Wt Readings from Last 3 Encounters:  09/03/18 167 lb (75.8 kg)  08/21/18 168 lb (76.2 kg)  08/12/18 167 lb (75.8 kg)      Studies/Labs Reviewed:   EKG:  EKG is not ordered today.    Recent Labs: 07/22/2018: Magnesium 1.5 08/12/2018: ALT 16; Hemoglobin 9.8; Platelets 118 08/21/2018: BUN 27; Creatinine, Ser 0.89;  Potassium 4.2; Sodium 144   Lipid Panel No results found for: CHOL, TRIG, HDL, CHOLHDL, VLDL, LDLCALC, LDLDIRECT  Additional studies/ records that were reviewed today include:   Echo 07/17/2018 LV EF: 15% -   20% Study Conclusions  - Left ventricle: The cavity size was mildly dilated. Systolic   function was severely reduced. The estimated ejection fraction   was in the range of 15% to 20%. Diffuse hypokinesis. Features are   consistent with a pseudonormal left ventricular filling pattern,   with concomitant abnormal relaxation and increased filling   pressure (grade 2 diastolic dysfunction). - Aortic valve: Trileaflet; mildly thickened, mildly calcified   leaflets. - Mitral valve: There was mild regurgitation. - Left atrium: The atrium was severely dilated. Volume/bsa, ES   (1-plane Simpson&'s, A4C): 69.5 ml/m^2. - Right atrium: The atrium was severely dilated. - Tricuspid valve: There was severe regurgitation. - Pulmonary arteries: Systolic pressure was moderately increased.   PA peak pressure: 47 mm Hg (S). - Pericardium, extracardiac: There was a left pleural effusion.    ASSESSMENT:    1. Edema of right lower extremity   2. Chronic systolic heart failure ([place])   3. Coronary artery disease involving native coronary artery of native heart without angina pectoris   4. Essential hypertension   5. Controlled type 2 diabetes mellitus without complication, without long-term current use of insulin ([place])   6. Hx of BKA, left ([place])      PLAN:  In order of problems listed above:  1. Right lower extremity edema: Did not respond very well even with increased diuretic.  His lung is clear, at this point, most of the excess fluid is staying in the right leg.  I am hesitant to increase the diuretic any further.  I recommend continue on the current therapy  2. Chronic systolic heart failure: Due to ischemic cardiomyopathy.  Triple-vessel disease noted on recent cardiac  catheterization, this was managed medically.  EF 10 to 15%.  Continue Entresto, spironolactone and Toprol-XL  3. CAD: Triple-vessel disease, turned down for CABG.  Managed medically.  Denies any recent chest pain  4. Hypertension: Blood pressure well controlled on current medication  5. DM2: Managed by primary care provider    Medication Adjustments/Labs and Tests Ordered: Current medicines are reviewed at length with the patient today.  Concerns regarding medicines are outlined above.  Medication changes, Labs and Tests ordered today are listed in the Patient Instructions below. Patient Instructions  Medication Instructions:  Your physician recommends that you continue on your current medications as directed. Please refer to the Current Medication list given to you today.  If you need a refill on your cardiac medications before your next appointment, please call your pharmacy.   Lab work: We will request a copy of your labwork done last week  Testing/Procedures: None ordered  Follow-Up: At [facility], you and your health needs are our priority.  As part of our continuing mission to provide you with exceptional heart care, we have created designated Provider Care Teams.  These Care Teams include your primary Cardiologist (physician) and Advanced Practice Providers (APPs -  Physician Assistants and Nurse Practitioners) who all work together to provide you with the care you need, when you need it. Follow up as planned with Dr.Croitoru in Feb 2020  Any Other Special Instructions Will Be Listed Below (If Applicable).       Hilbert Corrigan, [place]  09/05/2018 4:18 PM    [facility] [place], [place], [place]  [address] Phone: [phone]; Fax: [phone]

## 2018-09-03 NOTE — Patient Instructions (Signed)
Medication Instructions:  Your physician recommends that you continue on your current medications as directed. Please refer to the Current Medication list given to you today.  If you need a refill on your cardiac medications before your next appointment, please call your pharmacy.   Lab work: We will request a copy of your labwork done last week  Testing/Procedures: None ordered  Follow-Up: At [facility], you and your health needs are our priority.  As part of our continuing mission to provide you with exceptional heart care, we have created designated Provider Care Teams.  These Care Teams include your primary Cardiologist (physician) and Advanced Practice Providers (APPs -  Physician Assistants and Nurse Practitioners) who all work together to provide you with the care you need, when you need it. Follow up as planned with Dr.Croitoru in Feb 2020  Any Other Special Instructions Will Be Listed Below (If Applicable).

## 2018-09-03 NOTE — Telephone Encounter (Signed)
[facility] [phone] and spoke with the pt nurse [place]. Adv that [facility] requested that the pt have a bmet drawn last week and we have not received the results. Provided Crystal our fax #. Crystal sts that she will fax them over shortly.

## 2018-09-05 ENCOUNTER — Encounter: Payer: Self-pay | Admitting: Physician Assistant

## 2018-09-06 NOTE — Progress Notes (Signed)
His weight was consistently in the 132-138 lb range during his PCP visits in last several months and was about the same during his hospitalization. Wedge pressure at cath was 14 mm Hg, so he was at upper limit of normal volume and weight was about 137 lb.  Three visits in a row in our office the weight is indeed 30 higher, consistently. Does he have a prosthesis that he did not have before?

## 2018-09-19 DIAGNOSIS — D649 Anemia, unspecified: Secondary | ICD-10-CM | POA: Diagnosis not present

## 2018-09-19 DIAGNOSIS — I4891 Unspecified atrial fibrillation: Secondary | ICD-10-CM | POA: Diagnosis not present

## 2018-09-20 DIAGNOSIS — I4891 Unspecified atrial fibrillation: Secondary | ICD-10-CM | POA: Diagnosis not present

## 2018-09-20 DIAGNOSIS — E1165 Type 2 diabetes mellitus with hyperglycemia: Secondary | ICD-10-CM | POA: Diagnosis not present

## 2018-09-20 DIAGNOSIS — I10 Essential (primary) hypertension: Secondary | ICD-10-CM | POA: Diagnosis not present

## 2018-09-20 DIAGNOSIS — D61818 Other pancytopenia: Secondary | ICD-10-CM | POA: Diagnosis not present

## 2018-09-30 ENCOUNTER — Encounter: Payer: Medicare Other | Admitting: Vascular Surgery

## 2018-09-30 ENCOUNTER — Ambulatory Visit (INDEPENDENT_AMBULATORY_CARE_PROVIDER_SITE_OTHER): Payer: Medicare Other | Admitting: Vascular Surgery

## 2018-09-30 ENCOUNTER — Encounter: Payer: Self-pay | Admitting: Vascular Surgery

## 2018-09-30 VITALS — BP 125/60 | HR 66 | Temp 98.2°F | Resp 14

## 2018-09-30 DIAGNOSIS — I739 Peripheral vascular disease, unspecified: Secondary | ICD-10-CM

## 2018-09-30 DIAGNOSIS — I255 Ischemic cardiomyopathy: Secondary | ICD-10-CM

## 2018-09-30 NOTE — Progress Notes (Signed)
Vascular and Vein Specialist of [facility]  Patient name: Derek Hinton MRN: [id number] DOB: [birth year] Sex: male  REASON FOR VISIT: Follow-up from recent hospitalization October 2019  HPI: Derek Hinton is a 72 y.o. male here today for follow-up with his son.  He was admitted with severe congestive heart failure and had excoriation over his foot.  This was rather atypical and looks more like a fungal infection then issues related specifically to peripheral vascular disease.  He did have severe multilevel arterial insufficiency.  He underwent arteriography and stenting of subtotal occlusion of his right external iliac artery by Dr. Scot Dock He also underwent coronary artery angiography showing critical coronary disease and was not felt to be a candidate for bypass and is being treated medically.  He does wear oxygen and is continuing to have symptoms of congestive heart failure  Past Medical History:  Diagnosis Date  . COPD (chronic obstructive pulmonary disease) (HCC)    not on home O2  . Diabetes mellitus without complication ([place])   . Hypertension   . PVD (peripheral vascular disease) ([place])    s/p L BKA    Family History  Problem Relation Age of Onset  . Diabetes Father     SOCIAL HISTORY: Social History   Tobacco Use  . Smoking status: Former Smoker    Packs/day: 1.00    Years: 60.00    Pack years: 60.00    Last attempt to quit: 12/2017    Years since quitting: 0.8  . Smokeless tobacco: Never Used  Substance Use Topics  . Alcohol use: Not Currently    Allergies  Allergen Reactions  . Penicillins Other (See Comments)    Pt unsure, states he "didn't feel good" Patient states it made him "feel Funny"     Current Outpatient Medications  Medication Sig Dispense Refill  . aspirin 81 MG tablet Take 81 mg by mouth daily.    Marland Kitchen atorvastatin (LIPITOR) 40 MG tablet Take 1 tablet (40 mg total) by mouth daily.    Marland Kitchen BREO ELLIPTA  100-25 MCG/INH AEPB Inhale 2 puffs into the lungs as directed.    . feeding supplement, GLUCERNA SHAKE, (GLUCERNA SHAKE) LIQD Take 237 mLs by mouth 2 (two) times daily between meals.  0  . furosemide (LASIX) 40 MG tablet Take 80mg  in the morning and 40mg  in the pm 90 tablet 3  . glipiZIDE (GLUCOTROL XL) 5 MG 24 hr tablet Take 5 mg by mouth daily.    . iron polysaccharides (NIFEREX) 150 MG capsule Take 150 mg by mouth daily.    . metFORMIN (GLUCOPHAGE) 1000 MG tablet Take 1,000 mg by mouth 2 (two) times daily.    . metoprolol succinate (TOPROL-XL) 50 MG 24 hr tablet Take 1 tablet (50 mg total) by mouth daily. Take with or immediately following a meal. 30 tablet 3  . mometasone-formoterol (DULERA) 200-5 MCG/ACT AERO Inhale 2 puffs into the lungs 2 (two) times daily.    . Multiple Vitamin (MULTIVITAMIN WITH MINERALS) TABS tablet Take 1 tablet by mouth daily.    Marland Kitchen omeprazole (PRILOSEC) 20 MG capsule Take 1 capsule by mouth as directed.    . sacubitril-valsartan (ENTRESTO) 49-51 MG Take 1 tablet by mouth 2 (two) times daily. 60 tablet 3  . spironolactone (ALDACTONE) 25 MG tablet Take 1 tablet (25 mg total) by mouth daily. 30 tablet 3  . traMADol (ULTRAM) 50 MG tablet Take 50 mg by mouth every 6 (six) hours as needed for moderate pain.  No current facility-administered medications for this visit.     REVIEW OF SYSTEMS:  [X]  denotes positive finding, [ ]  denotes negative finding Cardiac  Comments:  Chest pain or chest pressure:    Shortness of breath upon exertion: x   Short of breath when lying flat:    Irregular heart rhythm:        Vascular    Pain in calf, thigh, or hip brought on by ambulation:    Pain in feet at night that wakes you up from your sleep:     Blood clot in your veins:    Leg swelling:  x         PHYSICAL EXAM: Vitals:   09/30/18 1341  BP: 125/60  Pulse: 66  Resp: 14  Temp: 98.2 F (36.8 C)  SpO2: 92%    GENERAL: The patient is a well-nourished male, in no  acute distress. The vital signs are documented above. CARDIOVASCULAR: I do not palpate right pedal pulses.  He has a left below-knee amputation. PULMONARY: There is good air exchange  MUSCULOSKELETAL: There are no major deformities or cyanosis. NEUROLOGIC: No focal weakness or paresthesias are detected. SKIN: There are no ulcers or rashes noted.  Pleat healing of the excoriated areas from his foot.  He does have significant pitting edema with some serous fluid weeping from open contained area on the pretibial area PSYCHIATRIC: The patient has a normal affect.  DATA:  Strong dorsalis pedis Doppler flow and audible flow in the peroneal and posterior tibial on the right  MEDICAL ISSUES: Stable from arterial peripheral vascular disease standpoint.  Would not recommend any further follow-up unless he has any progressive wound issues.  Explained the importance of elevation when possible and he may benefit from compression if he has progressive swelling in his right foot.  He was reassured with this discussion will see [place] again on an as-needed basis    Rosetta Posner, MD [facility] Vascular and Vein Specialists of [facility] Tel [phone] Pager [phone]

## 2018-10-03 DIAGNOSIS — D649 Anemia, unspecified: Secondary | ICD-10-CM | POA: Diagnosis not present

## 2018-10-03 DIAGNOSIS — Z9181 History of falling: Secondary | ICD-10-CM | POA: Diagnosis not present

## 2018-10-15 DIAGNOSIS — D61818 Other pancytopenia: Secondary | ICD-10-CM | POA: Diagnosis not present

## 2018-10-15 DIAGNOSIS — I10 Essential (primary) hypertension: Secondary | ICD-10-CM | POA: Diagnosis not present

## 2018-10-15 DIAGNOSIS — I4891 Unspecified atrial fibrillation: Secondary | ICD-10-CM | POA: Diagnosis not present

## 2018-10-15 DIAGNOSIS — E1165 Type 2 diabetes mellitus with hyperglycemia: Secondary | ICD-10-CM | POA: Diagnosis not present

## 2018-10-15 DIAGNOSIS — N454 Abscess of epididymis or testis: Secondary | ICD-10-CM | POA: Diagnosis not present

## 2018-10-16 DIAGNOSIS — H2703 Aphakia, bilateral: Secondary | ICD-10-CM | POA: Diagnosis not present

## 2018-10-20 DIAGNOSIS — C44722 Squamous cell carcinoma of skin of right lower limb, including hip: Secondary | ICD-10-CM | POA: Diagnosis not present

## 2018-10-20 DIAGNOSIS — C44702 Unspecified malignant neoplasm of skin of right lower limb, including hip: Secondary | ICD-10-CM | POA: Diagnosis not present

## 2018-10-29 DIAGNOSIS — N390 Urinary tract infection, site not specified: Secondary | ICD-10-CM | POA: Diagnosis not present

## 2018-11-13 DIAGNOSIS — M6281 Muscle weakness (generalized): Secondary | ICD-10-CM | POA: Diagnosis not present

## 2018-11-13 DIAGNOSIS — R2681 Unsteadiness on feet: Secondary | ICD-10-CM | POA: Diagnosis not present

## 2018-11-13 DIAGNOSIS — Z89512 Acquired absence of left leg below knee: Secondary | ICD-10-CM | POA: Diagnosis not present

## 2018-11-13 DIAGNOSIS — I739 Peripheral vascular disease, unspecified: Secondary | ICD-10-CM | POA: Diagnosis not present

## 2018-11-17 DIAGNOSIS — Z89512 Acquired absence of left leg below knee: Secondary | ICD-10-CM | POA: Diagnosis not present

## 2018-11-17 DIAGNOSIS — E1165 Type 2 diabetes mellitus with hyperglycemia: Secondary | ICD-10-CM | POA: Diagnosis not present

## 2018-11-17 DIAGNOSIS — M6281 Muscle weakness (generalized): Secondary | ICD-10-CM | POA: Diagnosis not present

## 2018-11-17 DIAGNOSIS — I4891 Unspecified atrial fibrillation: Secondary | ICD-10-CM | POA: Diagnosis not present

## 2018-11-17 DIAGNOSIS — R2681 Unsteadiness on feet: Secondary | ICD-10-CM | POA: Diagnosis not present

## 2018-11-17 DIAGNOSIS — J449 Chronic obstructive pulmonary disease, unspecified: Secondary | ICD-10-CM | POA: Diagnosis not present

## 2018-11-17 DIAGNOSIS — I739 Peripheral vascular disease, unspecified: Secondary | ICD-10-CM | POA: Diagnosis not present

## 2018-11-17 DIAGNOSIS — D61818 Other pancytopenia: Secondary | ICD-10-CM | POA: Diagnosis not present

## 2018-11-18 DIAGNOSIS — I10 Essential (primary) hypertension: Secondary | ICD-10-CM | POA: Diagnosis not present

## 2018-11-18 DIAGNOSIS — D649 Anemia, unspecified: Secondary | ICD-10-CM | POA: Diagnosis not present

## 2018-11-18 DIAGNOSIS — E119 Type 2 diabetes mellitus without complications: Secondary | ICD-10-CM | POA: Diagnosis not present

## 2018-11-19 DIAGNOSIS — I739 Peripheral vascular disease, unspecified: Secondary | ICD-10-CM | POA: Diagnosis not present

## 2018-11-19 DIAGNOSIS — Z89512 Acquired absence of left leg below knee: Secondary | ICD-10-CM | POA: Diagnosis not present

## 2018-11-19 DIAGNOSIS — R2681 Unsteadiness on feet: Secondary | ICD-10-CM | POA: Diagnosis not present

## 2018-11-19 DIAGNOSIS — M6281 Muscle weakness (generalized): Secondary | ICD-10-CM | POA: Diagnosis not present

## 2018-11-20 DIAGNOSIS — M6281 Muscle weakness (generalized): Secondary | ICD-10-CM | POA: Diagnosis not present

## 2018-11-20 DIAGNOSIS — Z89512 Acquired absence of left leg below knee: Secondary | ICD-10-CM | POA: Diagnosis not present

## 2018-11-20 DIAGNOSIS — R2681 Unsteadiness on feet: Secondary | ICD-10-CM | POA: Diagnosis not present

## 2018-11-20 DIAGNOSIS — I739 Peripheral vascular disease, unspecified: Secondary | ICD-10-CM | POA: Diagnosis not present

## 2018-11-21 ENCOUNTER — Ambulatory Visit: Payer: Medicare Other | Admitting: Cardiovascular Disease

## 2018-11-24 ENCOUNTER — Encounter: Payer: Self-pay | Admitting: Physician Assistant

## 2018-11-24 ENCOUNTER — Ambulatory Visit (INDEPENDENT_AMBULATORY_CARE_PROVIDER_SITE_OTHER): Payer: Medicare Other | Admitting: Physician Assistant

## 2018-11-24 VITALS — BP 138/72 | HR 60 | Ht 71.0 in | Wt 148.8 lb

## 2018-11-24 DIAGNOSIS — J449 Chronic obstructive pulmonary disease, unspecified: Secondary | ICD-10-CM

## 2018-11-24 DIAGNOSIS — D049 Carcinoma in situ of skin, unspecified: Secondary | ICD-10-CM | POA: Diagnosis not present

## 2018-11-24 DIAGNOSIS — I4819 Other persistent atrial fibrillation: Secondary | ICD-10-CM | POA: Diagnosis not present

## 2018-11-24 DIAGNOSIS — Z89512 Acquired absence of left leg below knee: Secondary | ICD-10-CM | POA: Diagnosis not present

## 2018-11-24 DIAGNOSIS — E119 Type 2 diabetes mellitus without complications: Secondary | ICD-10-CM | POA: Diagnosis not present

## 2018-11-24 DIAGNOSIS — I255 Ischemic cardiomyopathy: Secondary | ICD-10-CM | POA: Diagnosis not present

## 2018-11-24 DIAGNOSIS — I739 Peripheral vascular disease, unspecified: Secondary | ICD-10-CM

## 2018-11-24 DIAGNOSIS — I5042 Chronic combined systolic (congestive) and diastolic (congestive) heart failure: Secondary | ICD-10-CM | POA: Diagnosis not present

## 2018-11-24 DIAGNOSIS — R2681 Unsteadiness on feet: Secondary | ICD-10-CM | POA: Diagnosis not present

## 2018-11-24 DIAGNOSIS — M6281 Muscle weakness (generalized): Secondary | ICD-10-CM | POA: Diagnosis not present

## 2018-11-24 NOTE — Patient Instructions (Addendum)
Medication Instructions:   Your physician recommends that you continue on your current medications as directed. Please refer to the Current Medication list given to you today.  If you need a refill on your cardiac medications before your next appointment, please call your pharmacy.   Lab work:  NONE  If you have labs (blood work) drawn today and your tests are completely normal, you will receive your results only by: Marland Kitchen MyChart Message (if you have MyChart) OR . A paper copy in the mail If you have any lab test that is abnormal or we need to change your treatment, we will call you to review the results.  Testing/Procedures:   Follow-Up:  Your follow-up appointment is scheduled with Dr. Sallyanne Kuster on Feb 19, 2019 at 3:20 PM   Any Other Special Instructions Will Be Listed Below (If Applicable).  Call our office with the current dose of Lasix

## 2018-11-24 NOTE — Progress Notes (Signed)
Cardiology Office Note    Date:  11/24/2018   ID:  Derek Hinton, DOB Feb 21, 1946, MRN [id number]  PCP:  Guadalupe Maple, MD  Cardiologist:  Dr. Sallyanne Kuster  Chief Complaint  Patient presents with  . Follow-up    seen for Dr. Sallyanne Kuster    History of Present Illness:  Derek Hinton is a 73 y.o. male with PMH of PVD s/p L BKA, tobacco abuse, HTN, DM II, COPD, PAD, CAD, ICM, persistent afib, anemia and CVA.He presented to hospital in 07/2018 after being found down in his home confused state. On arrival to [facility], he was noted to be atrial fibrillation and there was also some concern of right lower extremity ischemia. Head CT was negative for acute issue,however does show chronic ischemic microvascular white matter disease and a small remote left thalamic lacunar infarct. Vascular surgery was consulted for possible critical limb ischemia. Patient was seen by Dr.Earlywho does not feel there was profound ischemia to warrant emergent intervention. Lower extremity arterial Doppler shows 75 to 99% stenosis in the right proximal to mid superficial femoral artery. Echocardiogram obtained during this admission also showed severely reduced LVEF at 15 to 20%, severe TR, mild MR, grade 2 DD, PA peak pressure 47 mmHg. He underwent lower extremity PV angiogram and had successful angioplasty and stenting of right external iliac artery. He was initially placed on aspirin and Plavix, however Plavix was taken off in the hospital due to significant bleeding and bruising. Cardiac catheterization performed on 10/14/2019showed severe triple-vessel disease with 80% proximal RCA, 80% mid RCA, 90% ostial RPDA, 80% proximal to mid left circumflex, 90% proximal LAD, 75% ostial D1, 75% mid LAD lesion. Cardiac index 2.68. Patient was seen by CT surgery, however was turned down. It was recommended to manage his coronary artery disease medically.  By the time I saw the patient in November 2019, his  weight has gained 30 pounds after discharge.  I discussed the case with Dr. Sallyanne Kuster and increased his diuretic to 80 mg a.m. and 40 mg p.m. and added spironolactone to his medical regimen.  His weight however did not change.  I kept him on the same dose of diuretic.  He says since the last office visit, he did lose roughly 20 pounds of weight.  His lab work continue to show stable renal function.  He says he discussed the case with his general practitioner wishing to lower the dose of diuretic, p.m. dose of 40 mg of Lasix may have been taken off.  We will confirm this change with his facility.  Otherwise he remains in atrial fibrillation not on any systemic anticoagulation therapy.  He is aware the increased risk of stroke at this point.  However I think his bleeding risk is higher.  He denies any recent weakness or slurring of speech.  His hemoglobin has remained around 8.5.  Since he had a significant bleeding on the Plavix, I do not think he can tolerate Eliquis.  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  He has been seen by radiation oncology service Dr. Pablo Ledger of [facility].  His son mentioned possibility of going for radiation therapy for 30 days.  At this point, his question really come down to whether this will offer him quality of life and whether this will prolong his life in the face of residual significant coronary artery disease.     Past Medical History:  Diagnosis Date  . COPD (chronic obstructive pulmonary disease) ([place])  not on home O2  . Diabetes mellitus without complication ([place])   . Hypertension   . PVD (peripheral vascular disease) ([place])    s/p L BKA    Past Surgical History:  Procedure Laterality Date  . ABDOMINAL AORTOGRAM W/LOWER EXTREMITY N/A 07/18/2018   Procedure: ABDOMINAL AORTOGRAM W/LOWER EXTREMITY;  Surgeon: Angelia Mould, MD;  Location: [place] [facility];  Service: Cardiovascular;  Laterality: N/A;  . BELOW KNEE LEG  AMPUTATION    . PERIPHERAL VASCULAR INTERVENTION Right 07/18/2018   Procedure: PERIPHERAL VASCULAR INTERVENTION;  Surgeon: Angelia Mould, MD;  Location: [place] [facility];  Service: Cardiovascular;  Laterality: Right;  . RIGHT/LEFT HEART CATH AND CORONARY ANGIOGRAPHY N/A 07/21/2018   Procedure: RIGHT/LEFT HEART CATH AND CORONARY ANGIOGRAPHY;  Surgeon: Jettie Booze, MD;  Location: [place] [facility];  Service: Cardiovascular;  Laterality: N/A;    Current Medications: Outpatient Medications Prior to Visit  Medication Sig Dispense Refill  . aspirin 81 MG tablet Take 81 mg by mouth daily.    Marland Kitchen atorvastatin (LIPITOR) 40 MG tablet Take 1 tablet (40 mg total) by mouth daily.    Marland Kitchen BREO ELLIPTA 100-25 MCG/INH AEPB Inhale 2 puffs into the lungs as directed.    . furosemide (LASIX) 40 MG tablet Take 80mg  in the morning and 40mg  in the pm 90 tablet 3  . glipiZIDE (GLUCOTROL XL) 5 MG 24 hr tablet Take 5 mg by mouth daily.    . iron polysaccharides (NIFEREX) 150 MG capsule Take 150 mg by mouth daily.    . metFORMIN (GLUCOPHAGE) 1000 MG tablet Take 1,000 mg by mouth 2 (two) times daily.    . metoprolol succinate (TOPROL-XL) 50 MG 24 hr tablet Take 1 tablet (50 mg total) by mouth daily. Take with or immediately following a meal. 30 tablet 3  . Multiple Vitamin (MULTIVITAMIN WITH MINERALS) TABS tablet Take 1 tablet by mouth daily.    Marland Kitchen omeprazole (PRILOSEC) 20 MG capsule Take 1 capsule by mouth as directed.    . sacubitril-valsartan (ENTRESTO) 49-51 MG Take 1 tablet by mouth 2 (two) times daily. 60 tablet 3  . spironolactone (ALDACTONE) 25 MG tablet Take 1 tablet (25 mg total) by mouth daily. 30 tablet 3  . feeding supplement, GLUCERNA SHAKE, (GLUCERNA SHAKE) LIQD Take 237 mLs by mouth 2 (two) times daily between meals. (Patient not taking: Reported on 11/24/2018)  0  . mometasone-formoterol (DULERA) 200-5 MCG/ACT AERO Inhale 2 puffs into the lungs 2 (two) times daily.    . traMADol (ULTRAM)  50 MG tablet Take 50 mg by mouth every 6 (six) hours as needed for moderate pain.      No facility-administered medications prior to visit.      Allergies:   Penicillins   Social History   Socioeconomic History  . Marital status: Divorced    Spouse name: Not on file  . Number of children: Not on file  . Years of education: Not on file  . Highest education level: Not on file  Occupational History  . Occupation: retired  Scientific laboratory technician  . Financial resource strain: Not on file  . Food insecurity:    Worry: Not on file    Inability: Not on file  . Transportation needs:    Medical: Not on file    Non-medical: Not on file  Tobacco Use  . Smoking status: Former Smoker    Packs/day: 1.00    Years: 60.00    Pack years: 60.00  Last attempt to quit: 12/2017    Years since quitting: 0.9  . Smokeless tobacco: Never Used  Substance and Sexual Activity  . Alcohol use: Not Currently  . Drug use: Never  . Sexual activity: Not on file  Lifestyle  . Physical activity:    Days per week: Not on file    Minutes per session: Not on file  . Stress: Not on file  Relationships  . Social connections:    Talks on phone: Not on file    Gets together: Not on file    Attends religious service: Not on file    Active member of club or organization: Not on file    Attends meetings of clubs or organizations: Not on file    Relationship status: Not on file  Other Topics Concern  . Not on file  Social History Narrative  . Not on file     Family History:  The patient's family history includes Diabetes in his father.   ROS:   Please see the history of present illness.    ROS All other systems reviewed and are negative.   PHYSICAL EXAM:   VS:  BP 138/72   Pulse 60   Ht 5\' 11"  (1.803 m)   Wt 148 lb 12.8 oz (67.5 kg)   SpO2 94%   BMI 20.75 kg/m    GEN: Well nourished, well developed, in no acute distress  HEENT: normal  Neck: no JVD, carotid bruits, or masses Cardiac: Irregularly  irregular; no murmurs, rubs, or gallops. 1+ pitting edema  Respiratory:  clear to auscultation bilaterally, normal work of breathing GI: soft, nontender, nondistended, + BS MS: L BKA Skin: warm and dry, no rash Neuro:  Alert and Oriented x 3, Strength and sensation are intact Psych: euthymic mood, full affect  Wt Readings from Last 3 Encounters:  11/24/18 148 lb 12.8 oz (67.5 kg)  09/03/18 167 lb (75.8 kg)  08/21/18 168 lb (76.2 kg)      Studies/Labs Reviewed:   EKG:  EKG is ordered today.  The ekg ordered today demonstrates atrial fibrillation, rate controlled  Recent Labs: 07/22/2018: Magnesium 1.5 08/12/2018: ALT 16; Hemoglobin 9.8; Platelets 118 08/21/2018: BUN 27; Creatinine, Ser 0.89; Potassium 4.2; Sodium 144   Lipid Panel No results found for: CHOL, TRIG, HDL, CHOLHDL, VLDL, LDLCALC, LDLDIRECT  Additional studies/ records that were reviewed today include:   Echo 07/17/2018 LV EF: 15% - 20% Study Conclusions  - Left ventricle: The cavity size was mildly dilated. Systolic function was severely reduced. The estimated ejection fraction was in the range of 15% to 20%. Diffuse hypokinesis. Features are consistent with a pseudonormal left ventricular filling pattern, with concomitant abnormal relaxation and increased filling pressure (grade 2 diastolic dysfunction). - Aortic valve: Trileaflet; mildly thickened, mildly calcified leaflets. - Mitral valve: There was mild regurgitation. - Left atrium: The atrium was severely dilated. Volume/bsa, ES (1-plane Simpson&'s, A4C): 69.5 ml/m^2. - Right atrium: The atrium was severely dilated. - Tricuspid valve: There was severe regurgitation. - Pulmonary arteries: Systolic pressure was moderately increased. PA peak pressure: 47 mm Hg (S). - Pericardium, extracardiac: There was a left pleural effusion.    Cath 07/21/2018  Prox RCA lesion is 80% stenosed.  Mid RCA lesion is 80% stenosed.  Ost RPDA to  RPDA lesion is 90% stenosed.  Prox Cx to Mid Cx lesion is 80% stenosed.  Prox LAD lesion is 90% stenosed.  Ost 1st Diag lesion is 75% stenosed.  Mid LAD lesion is 75%  stenosed.  LV end diastolic pressure is normal.  There is no aortic valve stenosis.  Hemodynamic findings consistent with mild pulmonary hypertension.  Ao sat 90%, PA 58%, PA pressure 54/19, mean PA 32 mm Hg; PCWP 16/17; mean PCWP 14 mm Hg; CO 4.7 L/min; CI 2.68  Severe three vessel disease in the setting of low EF. Plan for cardiac surgery consult. If he is not a candidate for CABG, would have to consider medical therapy vs. Atherectomy of LAD.   Plavix on hold currently in the event of cardiac surgery. Would need to restart depending on plan of treatment.   Restart heparin in 8 hours post sheath pull.    ASSESSMENT:    1. Chronic combined systolic and diastolic heart failure (HCC)   2. Persistent atrial fibrillation   3. PAD (peripheral artery disease) ([place])   4. Hx of BKA, left ([place])   5. Squamous cell carcinoma in situ (SCCIS) of skin   6. Ischemic cardiomyopathy   7. Chronic obstructive pulmonary disease, unspecified COPD type ([place])   8. Controlled type 2 diabetes mellitus without complication, without long-term current use of insulin (HCC)      PLAN:  In order of problems listed above:  1. Chronic combined systolic and diastolic heart failure: He lost another 20 pounds since the last office visit.  He was on Lasix 80 mg a.m. and 40 mg p.m. before, he says his Lasix was recently changed to 80 mg a.m. only.  We will confirm with his facility.  Renal function and electrolytes stable on the last lab work.  Continue Toprol-XL, Entresto and spironolactone.  Will defer to Dr. Sallyanne Kuster to consider repeat echocardiogram.  2. Ischemic cardiomyopathy: Continue beta-blocker.  Denies any chest pain.  According to the patient, he walks around 700 feet with assistance at the facility.  3. Squamous cell  carcinoma of the leg: Confirmed by biopsy.  Recently seen by radiation oncology service at [facility].  Family questions if they will have effect on the heart.  I think he can probably tolerate the procedure, the question comes down to whether this will prolong his life or improve his quality of life at this point.  I will need to discuss this with Dr. Sallyanne Kuster  4. Persistent atrial fibrillation: Remains in atrial fibrillation, however heart rate very well controlled.  He had significant bleeding in the hospital last year on Plavix.  I have been very hesitant to start him on systemic anticoagulation therapy.  I discussed the risk and benefit with the family member and also the patient.  I think at this point the risk of bleeding outweigh the potential benefit.  His hemoglobin has remained stable around 8.5.  5. PAD: Recently seen by vascular surgery.  6. COPD: On oxygen with ambulation  7. DM2: Managed by primary care provider.      Medication Adjustments/Labs and Tests Ordered: Current medicines are reviewed at length with the patient today.  Concerns regarding medicines are outlined above.  Medication changes, Labs and Tests ordered today are listed in the Patient Instructions below. Patient Instructions  Medication Instructions:   Your physician recommends that you continue on your current medications as directed. Please refer to the Current Medication list given to you today.  If you need a refill on your cardiac medications before your next appointment, please call your pharmacy.   Lab work:  NONE  If you have labs (blood work) drawn today and your tests are completely normal, you will receive your  results only by: Marland Kitchen MyChart Message (if you have MyChart) OR . A paper copy in the mail If you have any lab test that is abnormal or we need to change your treatment, we will call you to review the results.  Testing/Procedures:   Follow-Up:  Your follow-up appointment is scheduled  with Dr. Sallyanne Kuster on Feb 19, 2019 at 3:20 PM   Any Other Special Instructions Will Be Listed Below (If Applicable).  Call our office with the current dose of Lasix       Weston Brass Almyra Deforest, [place]  11/24/2018 9:51 AM    [address], [place], [place]  [address] Phone: [phone]; Fax: [phone]

## 2018-11-25 DIAGNOSIS — Z89512 Acquired absence of left leg below knee: Secondary | ICD-10-CM | POA: Diagnosis not present

## 2018-11-25 DIAGNOSIS — I739 Peripheral vascular disease, unspecified: Secondary | ICD-10-CM | POA: Diagnosis not present

## 2018-11-25 DIAGNOSIS — M6281 Muscle weakness (generalized): Secondary | ICD-10-CM | POA: Diagnosis not present

## 2018-11-25 DIAGNOSIS — R2681 Unsteadiness on feet: Secondary | ICD-10-CM | POA: Diagnosis not present

## 2018-11-26 DIAGNOSIS — M6281 Muscle weakness (generalized): Secondary | ICD-10-CM | POA: Diagnosis not present

## 2018-11-26 DIAGNOSIS — I739 Peripheral vascular disease, unspecified: Secondary | ICD-10-CM | POA: Diagnosis not present

## 2018-11-26 DIAGNOSIS — R2681 Unsteadiness on feet: Secondary | ICD-10-CM | POA: Diagnosis not present

## 2018-11-26 DIAGNOSIS — Z89512 Acquired absence of left leg below knee: Secondary | ICD-10-CM | POA: Diagnosis not present

## 2018-11-27 DIAGNOSIS — M6281 Muscle weakness (generalized): Secondary | ICD-10-CM | POA: Diagnosis not present

## 2018-11-27 DIAGNOSIS — Z89512 Acquired absence of left leg below knee: Secondary | ICD-10-CM | POA: Diagnosis not present

## 2018-11-27 DIAGNOSIS — R2681 Unsteadiness on feet: Secondary | ICD-10-CM | POA: Diagnosis not present

## 2018-11-27 DIAGNOSIS — I739 Peripheral vascular disease, unspecified: Secondary | ICD-10-CM | POA: Diagnosis not present

## 2018-11-28 DIAGNOSIS — M6281 Muscle weakness (generalized): Secondary | ICD-10-CM | POA: Diagnosis not present

## 2018-11-28 DIAGNOSIS — Z89512 Acquired absence of left leg below knee: Secondary | ICD-10-CM | POA: Diagnosis not present

## 2018-11-28 DIAGNOSIS — I739 Peripheral vascular disease, unspecified: Secondary | ICD-10-CM | POA: Diagnosis not present

## 2018-11-28 DIAGNOSIS — R2681 Unsteadiness on feet: Secondary | ICD-10-CM | POA: Diagnosis not present

## 2018-12-01 DIAGNOSIS — Z89512 Acquired absence of left leg below knee: Secondary | ICD-10-CM | POA: Diagnosis not present

## 2018-12-01 DIAGNOSIS — M6281 Muscle weakness (generalized): Secondary | ICD-10-CM | POA: Diagnosis not present

## 2018-12-01 DIAGNOSIS — I739 Peripheral vascular disease, unspecified: Secondary | ICD-10-CM | POA: Diagnosis not present

## 2018-12-01 DIAGNOSIS — R2681 Unsteadiness on feet: Secondary | ICD-10-CM | POA: Diagnosis not present

## 2018-12-01 NOTE — Progress Notes (Signed)
I would agree with avoiding anticoagulation for the time being.  If his weight does not increase, okay to keep on once daily diuretic.  It is important make sure that he is being weighed daily.  Radiation to his legs should not cause any problems with his cardiac issues.  I do not think there is a whole lot of benefit to repeating his echocardiogram. Thank you, he is very complicated and takes a long time to sort out, [facility]

## 2018-12-02 DIAGNOSIS — Z89512 Acquired absence of left leg below knee: Secondary | ICD-10-CM | POA: Diagnosis not present

## 2018-12-02 DIAGNOSIS — R2681 Unsteadiness on feet: Secondary | ICD-10-CM | POA: Diagnosis not present

## 2018-12-02 DIAGNOSIS — M6281 Muscle weakness (generalized): Secondary | ICD-10-CM | POA: Diagnosis not present

## 2018-12-02 DIAGNOSIS — I739 Peripheral vascular disease, unspecified: Secondary | ICD-10-CM | POA: Diagnosis not present

## 2018-12-23 DIAGNOSIS — J449 Chronic obstructive pulmonary disease, unspecified: Secondary | ICD-10-CM | POA: Diagnosis not present

## 2018-12-23 DIAGNOSIS — D61818 Other pancytopenia: Secondary | ICD-10-CM | POA: Diagnosis not present

## 2018-12-23 DIAGNOSIS — I4891 Unspecified atrial fibrillation: Secondary | ICD-10-CM | POA: Diagnosis not present

## 2018-12-23 DIAGNOSIS — E1165 Type 2 diabetes mellitus with hyperglycemia: Secondary | ICD-10-CM | POA: Diagnosis not present

## 2018-12-24 DIAGNOSIS — D649 Anemia, unspecified: Secondary | ICD-10-CM | POA: Diagnosis not present

## 2019-02-07 DIAGNOSIS — I4891 Unspecified atrial fibrillation: Secondary | ICD-10-CM | POA: Diagnosis not present

## 2019-02-07 DIAGNOSIS — E1165 Type 2 diabetes mellitus with hyperglycemia: Secondary | ICD-10-CM | POA: Diagnosis not present

## 2019-02-07 DIAGNOSIS — J449 Chronic obstructive pulmonary disease, unspecified: Secondary | ICD-10-CM | POA: Diagnosis not present

## 2019-02-07 DIAGNOSIS — D61818 Other pancytopenia: Secondary | ICD-10-CM | POA: Diagnosis not present

## 2019-02-16 DIAGNOSIS — E119 Type 2 diabetes mellitus without complications: Secondary | ICD-10-CM | POA: Diagnosis not present

## 2019-02-16 DIAGNOSIS — I10 Essential (primary) hypertension: Secondary | ICD-10-CM | POA: Diagnosis not present

## 2019-02-16 DIAGNOSIS — D649 Anemia, unspecified: Secondary | ICD-10-CM | POA: Diagnosis not present

## 2019-02-17 ENCOUNTER — Ambulatory Visit: Payer: Medicare Other | Admitting: Cardiovascular Disease

## 2019-02-18 DIAGNOSIS — R293 Abnormal posture: Secondary | ICD-10-CM | POA: Diagnosis not present

## 2019-02-19 ENCOUNTER — Ambulatory Visit: Payer: Medicare Other | Admitting: Cardiovascular Disease

## 2019-02-20 DIAGNOSIS — R293 Abnormal posture: Secondary | ICD-10-CM | POA: Diagnosis not present

## 2019-02-23 DIAGNOSIS — R293 Abnormal posture: Secondary | ICD-10-CM | POA: Diagnosis not present

## 2019-02-24 DIAGNOSIS — R293 Abnormal posture: Secondary | ICD-10-CM | POA: Diagnosis not present

## 2019-02-25 DIAGNOSIS — R293 Abnormal posture: Secondary | ICD-10-CM | POA: Diagnosis not present

## 2019-02-26 DIAGNOSIS — R293 Abnormal posture: Secondary | ICD-10-CM | POA: Diagnosis not present

## 2019-03-23 DIAGNOSIS — Z0000 Encounter for general adult medical examination without abnormal findings: Secondary | ICD-10-CM | POA: Diagnosis not present

## 2019-04-04 DIAGNOSIS — D61818 Other pancytopenia: Secondary | ICD-10-CM | POA: Diagnosis not present

## 2019-04-04 DIAGNOSIS — J449 Chronic obstructive pulmonary disease, unspecified: Secondary | ICD-10-CM | POA: Diagnosis not present

## 2019-04-04 DIAGNOSIS — I4891 Unspecified atrial fibrillation: Secondary | ICD-10-CM | POA: Diagnosis not present

## 2019-04-04 DIAGNOSIS — E1165 Type 2 diabetes mellitus with hyperglycemia: Secondary | ICD-10-CM | POA: Diagnosis not present

## 2019-04-27 DIAGNOSIS — E1165 Type 2 diabetes mellitus with hyperglycemia: Secondary | ICD-10-CM | POA: Diagnosis not present

## 2019-04-27 DIAGNOSIS — J449 Chronic obstructive pulmonary disease, unspecified: Secondary | ICD-10-CM | POA: Diagnosis not present

## 2019-04-27 DIAGNOSIS — I4891 Unspecified atrial fibrillation: Secondary | ICD-10-CM | POA: Diagnosis not present

## 2019-04-27 DIAGNOSIS — D61818 Other pancytopenia: Secondary | ICD-10-CM | POA: Diagnosis not present

## 2019-04-30 DIAGNOSIS — I739 Peripheral vascular disease, unspecified: Secondary | ICD-10-CM | POA: Diagnosis not present

## 2019-04-30 DIAGNOSIS — B351 Tinea unguium: Secondary | ICD-10-CM | POA: Diagnosis not present

## 2019-04-30 DIAGNOSIS — Z89512 Acquired absence of left leg below knee: Secondary | ICD-10-CM | POA: Diagnosis not present

## 2019-05-11 DIAGNOSIS — Z20828 Contact with and (suspected) exposure to other viral communicable diseases: Secondary | ICD-10-CM | POA: Diagnosis not present

## 2019-05-18 DIAGNOSIS — I4891 Unspecified atrial fibrillation: Secondary | ICD-10-CM | POA: Diagnosis not present

## 2019-05-18 DIAGNOSIS — E1165 Type 2 diabetes mellitus with hyperglycemia: Secondary | ICD-10-CM | POA: Diagnosis not present

## 2019-05-18 DIAGNOSIS — D61818 Other pancytopenia: Secondary | ICD-10-CM | POA: Diagnosis not present

## 2019-05-18 DIAGNOSIS — J449 Chronic obstructive pulmonary disease, unspecified: Secondary | ICD-10-CM | POA: Diagnosis not present

## 2019-05-22 DIAGNOSIS — Z89512 Acquired absence of left leg below knee: Secondary | ICD-10-CM | POA: Diagnosis not present

## 2019-05-22 DIAGNOSIS — R2681 Unsteadiness on feet: Secondary | ICD-10-CM | POA: Diagnosis not present

## 2019-05-25 DIAGNOSIS — R2681 Unsteadiness on feet: Secondary | ICD-10-CM | POA: Diagnosis not present

## 2019-05-25 DIAGNOSIS — Z89512 Acquired absence of left leg below knee: Secondary | ICD-10-CM | POA: Diagnosis not present

## 2019-05-26 DIAGNOSIS — Z89512 Acquired absence of left leg below knee: Secondary | ICD-10-CM | POA: Diagnosis not present

## 2019-05-26 DIAGNOSIS — R2681 Unsteadiness on feet: Secondary | ICD-10-CM | POA: Diagnosis not present

## 2019-05-27 DIAGNOSIS — R2681 Unsteadiness on feet: Secondary | ICD-10-CM | POA: Diagnosis not present

## 2019-05-27 DIAGNOSIS — Z89512 Acquired absence of left leg below knee: Secondary | ICD-10-CM | POA: Diagnosis not present

## 2019-05-28 DIAGNOSIS — Z89512 Acquired absence of left leg below knee: Secondary | ICD-10-CM | POA: Diagnosis not present

## 2019-05-28 DIAGNOSIS — R2681 Unsteadiness on feet: Secondary | ICD-10-CM | POA: Diagnosis not present

## 2019-05-28 DIAGNOSIS — E119 Type 2 diabetes mellitus without complications: Secondary | ICD-10-CM | POA: Diagnosis not present

## 2019-05-28 DIAGNOSIS — D649 Anemia, unspecified: Secondary | ICD-10-CM | POA: Diagnosis not present

## 2019-05-28 DIAGNOSIS — I10 Essential (primary) hypertension: Secondary | ICD-10-CM | POA: Diagnosis not present

## 2019-06-02 DIAGNOSIS — Z89512 Acquired absence of left leg below knee: Secondary | ICD-10-CM | POA: Diagnosis not present

## 2019-06-02 DIAGNOSIS — R2681 Unsteadiness on feet: Secondary | ICD-10-CM | POA: Diagnosis not present

## 2019-06-03 DIAGNOSIS — Z89512 Acquired absence of left leg below knee: Secondary | ICD-10-CM | POA: Diagnosis not present

## 2019-06-03 DIAGNOSIS — R2681 Unsteadiness on feet: Secondary | ICD-10-CM | POA: Diagnosis not present

## 2019-06-04 DIAGNOSIS — R2681 Unsteadiness on feet: Secondary | ICD-10-CM | POA: Diagnosis not present

## 2019-06-04 DIAGNOSIS — Z89512 Acquired absence of left leg below knee: Secondary | ICD-10-CM | POA: Diagnosis not present

## 2019-06-10 ENCOUNTER — Encounter: Payer: Self-pay | Admitting: Cardiovascular Disease

## 2019-06-10 ENCOUNTER — Other Ambulatory Visit: Payer: Self-pay

## 2019-06-10 ENCOUNTER — Ambulatory Visit (INDEPENDENT_AMBULATORY_CARE_PROVIDER_SITE_OTHER): Payer: Medicare Other | Admitting: Cardiovascular Disease

## 2019-06-10 VITALS — BP 122/55 | HR 55 | Temp 97.1°F | Ht 71.0 in | Wt 136.4 lb

## 2019-06-10 DIAGNOSIS — I5042 Chronic combined systolic (congestive) and diastolic (congestive) heart failure: Secondary | ICD-10-CM | POA: Diagnosis not present

## 2019-06-10 DIAGNOSIS — I255 Ischemic cardiomyopathy: Secondary | ICD-10-CM

## 2019-06-10 DIAGNOSIS — Z20828 Contact with and (suspected) exposure to other viral communicable diseases: Secondary | ICD-10-CM | POA: Diagnosis not present

## 2019-06-10 DIAGNOSIS — Z89512 Acquired absence of left leg below knee: Secondary | ICD-10-CM

## 2019-06-10 DIAGNOSIS — I2510 Atherosclerotic heart disease of native coronary artery without angina pectoris: Secondary | ICD-10-CM | POA: Diagnosis not present

## 2019-06-10 DIAGNOSIS — I4819 Other persistent atrial fibrillation: Secondary | ICD-10-CM | POA: Diagnosis not present

## 2019-06-10 DIAGNOSIS — I739 Peripheral vascular disease, unspecified: Secondary | ICD-10-CM

## 2019-06-10 DIAGNOSIS — J418 Mixed simple and mucopurulent chronic bronchitis: Secondary | ICD-10-CM | POA: Diagnosis not present

## 2019-06-10 DIAGNOSIS — I10 Essential (primary) hypertension: Secondary | ICD-10-CM

## 2019-06-10 DIAGNOSIS — E1151 Type 2 diabetes mellitus with diabetic peripheral angiopathy without gangrene: Secondary | ICD-10-CM | POA: Diagnosis not present

## 2019-06-10 NOTE — Progress Notes (Signed)
Cardiology Office Note:    Date:  06/12/2019   ID:  Fotis Franchina, DOB [birth year], MRN [id number]  PCP:  Guadalupe Maple, MD  Cardiologist:  Sanda Klein, MD  Electrophysiologist:  None   Referring MD: Guadalupe Maple, MD   Chief Complaint  Patient presents with  . Congestive Heart Failure    History of Present Illness:    Jarin Hallquist is a 73 y.o. male with a hx of CHF (EF 15-20%), CAD (severe 3-vessel cath 2019, medical mgmt), PAD (PTA R external iliac Oct 2019), s/p left BKA, long-term persistent atrial fibrillation, DM, COPD, history of ischemic CVA, squamous cell carcinoma of the skin of the anterior right thigh.  He is doing better.  He is very positive and in great spirits.  He is obviously sedentary, but is able to walk with a walker for about 600 feet without oxygen and his saturation was 95%, no dyspnea.  He does not typically have edema in his amputation stump or in the contralateral limb.  He does not have pain in the right leg at rest or with light activity.  He denies orthopnea or PND.  He has not had any dizziness, syncope or palpitations and denies any focal neurological complaints.  He has not had any falls or injuries or bleeding problems.  At home with his prosthesis he is weighing at around 138 pounds (they wake him twice a week) and his weight has been fairly steady.  He is on excellent medical therapy for heart failure including moderate doses of Entresto, spironolactone, metoprolol succinate and dapagliflozin.  He is on a very low-dose of loop diuretic (furosemide 20 mg daily).   He is on aspirin, clopidogrel and atorvastatin.  He is not receiving anticoagulants.  Past Medical History:  Diagnosis Date  . COPD (chronic obstructive pulmonary disease) (HCC)    not on home O2  . Diabetes mellitus without complication ([place])   . Hypertension   . PVD (peripheral vascular disease) ([place])    s/p L BKA    Past Surgical History:  Procedure Laterality Date  .  ABDOMINAL AORTOGRAM W/LOWER EXTREMITY N/A 07/18/2018   Procedure: ABDOMINAL AORTOGRAM W/LOWER EXTREMITY;  Surgeon: Angelia Mould, MD;  Location: [facility];  Service: Cardiovascular;  Laterality: N/A;  . BELOW KNEE LEG AMPUTATION    . PERIPHERAL VASCULAR INTERVENTION Right 07/18/2018   Procedure: PERIPHERAL VASCULAR INTERVENTION;  Surgeon: Angelia Mould, MD;  Location: [place] [facility];  Service: Cardiovascular;  Laterality: Right;  . RIGHT/LEFT HEART CATH AND CORONARY ANGIOGRAPHY N/A 07/21/2018   Procedure: RIGHT/LEFT HEART CATH AND CORONARY ANGIOGRAPHY;  Surgeon: Jettie Booze, MD;  Location: [facility];  Service: Cardiovascular;  Laterality: N/A;    Current Medications: Current Meds  Medication Sig  . aspirin 81 MG tablet Take 81 mg by mouth daily.  Marland Kitchen atorvastatin (LIPITOR) 40 MG tablet Take 1 tablet (40 mg total) by mouth daily.  Marland Kitchen BREO ELLIPTA 100-25 MCG/INH AEPB Inhale 2 puffs into the lungs as directed.  Marland Kitchen FARXIGA 10 MG TABS tablet   . furosemide (LASIX) 40 MG tablet Take 80mg  in the morning and 40mg  in the pm  . glipiZIDE (GLUCOTROL XL) 5 MG 24 hr tablet Take 5 mg by mouth daily.  . iron polysaccharides (NIFEREX) 150 MG capsule Take 150 mg by mouth daily.  . metFORMIN (GLUCOPHAGE) 1000 MG tablet Take 1,000 mg by mouth 2 (two) times daily.  . metoprolol succinate (TOPROL-XL) 50 MG 24 hr tablet Take  1 tablet (50 mg total) by mouth daily. Take with or immediately following a meal.  . Multiple Vitamin (MULTIVITAMIN WITH MINERALS) TABS tablet Take 1 tablet by mouth daily.  Marland Kitchen omeprazole (PRILOSEC) 20 MG capsule Take 1 capsule by mouth as directed.  . sacubitril-valsartan (ENTRESTO) 49-51 MG Take 1 tablet by mouth 2 (two) times daily.  Marland Kitchen spironolactone (ALDACTONE) 25 MG tablet Take 1 tablet (25 mg total) by mouth daily.     Allergies:   Penicillins   Social History   Socioeconomic History  . Marital status: Divorced    Spouse name: Not on  file  . Number of children: Not on file  . Years of education: Not on file  . Highest education level: Not on file  Occupational History  . Occupation: retired  Scientific laboratory technician  . Financial resource strain: Not on file  . Food insecurity    Worry: Not on file    Inability: Not on file  . Transportation needs    Medical: Not on file    Non-medical: Not on file  Tobacco Use  . Smoking status: Former Smoker    Packs/day: 1.00    Years: 60.00    Pack years: 60.00    Quit date: 12/2017    Years since quitting: 1.5  . Smokeless tobacco: Never Used  Substance and Sexual Activity  . Alcohol use: Not Currently  . Drug use: Never  . Sexual activity: Not on file  Lifestyle  . Physical activity    Days per week: Not on file    Minutes per session: Not on file  . Stress: Not on file  Relationships  . Social Herbalist on phone: Not on file    Gets together: Not on file    Attends religious service: Not on file    Active member of club or organization: Not on file    Attends meetings of clubs or organizations: Not on file    Relationship status: Not on file  Other Topics Concern  . Not on file  Social History Narrative  . Not on file     Family History: The patient's family history includes Diabetes in his father.  ROS:   Please see the history of present illness.     All other systems reviewed and are negative.  EKGs/Labs/Other Studies Reviewed:    The following studies were reviewed today: Right left heart catheterization October 2019, echocardiogram October 2019, peripheral vascular procedure October 2019  Echo 07/17/2018 LV EF: 15% - 20% Study Conclusions  - Left ventricle: The cavity size was mildly dilated. Systolic function was severely reduced. The estimated ejection fraction was in the range of 15% to 20%. Diffuse hypokinesis. Features are consistent with a pseudonormal left ventricular filling pattern, with concomitant abnormal relaxation  and increased filling pressure (grade 2 diastolic dysfunction). - Aortic valve: Trileaflet; mildly thickened, mildly calcified leaflets. - Mitral valve: There was mild regurgitation. - Left atrium: The atrium was severely dilated. Volume/bsa, ES (1-plane Simpson&'s, A4C): 69.5 ml/m^2. - Right atrium: The atrium was severely dilated. - Tricuspid valve: There was severe regurgitation. - Pulmonary arteries: Systolic pressure was moderately increased. PA peak pressure: 47 mm Hg (S). - Pericardium, extracardiac: There was a left pleural effusion.    Cath 07/21/2018  Prox RCA lesion is 80% stenosed.  Mid RCA lesion is 80% stenosed.  Ost RPDA to RPDA lesion is 90% stenosed.  Prox Cx to Mid Cx lesion is 80% stenosed.  Prox LAD lesion  is 90% stenosed.  Ost 1st Diag lesion is 75% stenosed.  Mid LAD lesion is 75% stenosed.  LV end diastolic pressure is normal.  There is no aortic valve stenosis.  Hemodynamic findings consistent with mild pulmonary hypertension.  Ao sat 90%, PA 58%, PA pressure 54/19, mean PA 32 mm Hg; PCWP 16/17; mean PCWP 14 mm Hg; CO 4.7 L/min; CI 2.68  Severe three vessel disease in the setting of low EF. Plan for cardiac surgery consult. If he is not a candidate for CABG, would have to consider medical therapy vs. Atherectomy of LAD.    EKG:  EKG is not ordered today.  The ekg ordered November 24, 2018 demonstrates atrial fibrillation with controlled ventricular response.  There are no Q waves and there is only mild diffuse T wave flattening.  Recent Labs: 07/22/2018: Magnesium 1.5 08/12/2018: ALT 16; Hemoglobin 9.8; Platelets 118 08/21/2018: BUN 27; Creatinine, Ser 0.89; Potassium 4.2; Sodium 144  Recent Lipid Panel No results found for: CHOL, TRIG, HDL, CHOLHDL, VLDL, LDLCALC, LDLDIRECT  Physical Exam:    VS:  BP (!) 122/55   Pulse (!) 55   Temp (!) 97.1 F (36.2 C)   Ht 5\' 11"  (1.803 m)   Wt 136 lb 6.4 oz (61.9 kg)   SpO2 100%    BMI 19.02 kg/m     Wt Readings from Last 3 Encounters:  06/10/19 136 lb 6.4 oz (61.9 kg)  11/24/18 148 lb 12.8 oz (67.5 kg)  09/03/18 167 lb (75.8 kg)     GEN: Underweight, well developed in no acute distress HEENT: Normal NECK: No JVD; faint bilateral carotid bruits LYMPHATICS: No lymphadenopathy CARDIAC: Irregular, no murmurs, rubs, gallops RESPIRATORY:  Clear to auscultation without rales, wheezing or rhonchi  ABDOMEN: Soft, non-tender, non-distended MUSCULOSKELETAL:  No edema; left below the knee amputation, wearing prosthesis SKIN: Warm and dry NEUROLOGIC:  Alert and oriented x 3 PSYCHIATRIC:  Normal affect   ASSESSMENT:    1. Chronic combined systolic and diastolic heart failure (HCC)   2. Persistent atrial fibrillation   3. Coronary artery disease involving native coronary artery of native heart without angina pectoris   4. PVD (peripheral vascular disease) ([place])   5. Left below-knee amputee ([place])   6. Mixed simple and mucopurulent chronic bronchitis ([place])   7. Essential hypertension   8. Type 2 diabetes mellitus with peripheral vascular disease (HCC)    PLAN:    In order of problems listed above:  1. CHF: Appears euvolemic on a very low-dose of loop diuretic.  He really is on excellent medical therapy and seems to have good functional status.  His diastolic blood pressure is relatively low and I do not think we should push our luck with higher doses of Entresto or beta-blockers.  Continue regular weight monitoring. 2. AFib: Well rate controlled. CHADSVasc 8 (age 69, DM, CAD, CHF, HTN, stroke on imaging studies). He is not on anticoagulants but is receiving dual antiplatelet therapy following his peripheral revascularization procedure last October.  He has had problems with iron deficiency anemia and is still on iron supplements.  He had serious bleeding complications while taking clopidogrel in 2019.  The benefit of antiplatelet with limb threatening ischemia and severe  multivessel CAD is undeniable.  Although he is clearly at higher than average risk of embolic stroke, at this point antiplatelet therapy appears to be more important and the risk of bleeding with combined anticoagulant/antiplatelet therapy is probably too high. 3. CAD: He does not have angina on  monotherapy with beta-blockers, albeit with sedentary lifestyle. 4. PAD: No complaints of claudication in the right leg. 5. L BKA: Able to work shorter to medium distances with a walker and prosthesis. 6. COPD: He has been walking without oxygen recently. 7. HTN: Very well controlled 8. DM: Reports greatly improved control following addition of SGLT2 inhibitor   Medication Adjustments/Labs and Tests Ordered: Current medicines are reviewed at length with the patient today.  Concerns regarding medicines are outlined above.  No orders of the defined types were placed in this encounter.  No orders of the defined types were placed in this encounter.   Patient Instructions  Medication Instructions:  Your physician recommends that you continue on your current medications as directed. Please refer to the Current Medication list given to you today.  If you need a refill on your cardiac medications before your next appointment, please call your pharmacy.   Lab work: None ordered If you have labs (blood work) drawn today and your tests are completely normal, you will receive your results only by: [place] (if you have MyChart) OR A paper copy in the mail If you have any lab test that is abnormal or we need to change your treatment, we will call you to review the results.  Testing/Procedures: None ordered  Follow-Up: At [facility], you and your health needs are our priority.  As part of our continuing mission to provide you with exceptional heart care, we have created designated Provider Care Teams.  These Care Teams include your primary Cardiologist (physician) and Advanced Practice Providers  (APPs -  Physician Assistants and Nurse Practitioners) who all work together to provide you with the care you need, when you need it. You will need a follow up appointment in 6 months.  Please call our office 2 months in advance to schedule this appointment.  You may see Sanda Klein, MD or one of the following Advanced Practice Providers on your designated Care Team: Almyra Deforest, PA-C Fabian Sharp, PA-C          Signed, Sanda Klein, MD  06/12/2019 11:44 AM    [place]

## 2019-06-10 NOTE — Patient Instructions (Signed)

## 2019-06-12 ENCOUNTER — Encounter: Payer: Self-pay | Admitting: Cardiovascular Disease

## 2019-06-12 DIAGNOSIS — Z89512 Acquired absence of left leg below knee: Secondary | ICD-10-CM | POA: Insufficient documentation

## 2019-06-17 DIAGNOSIS — Z20828 Contact with and (suspected) exposure to other viral communicable diseases: Secondary | ICD-10-CM | POA: Diagnosis not present

## 2019-06-24 DIAGNOSIS — Z20828 Contact with and (suspected) exposure to other viral communicable diseases: Secondary | ICD-10-CM | POA: Diagnosis not present

## 2019-06-26 DIAGNOSIS — L02212 Cutaneous abscess of back [any part, except buttock]: Secondary | ICD-10-CM | POA: Diagnosis not present

## 2019-06-26 DIAGNOSIS — E1165 Type 2 diabetes mellitus with hyperglycemia: Secondary | ICD-10-CM | POA: Diagnosis not present

## 2019-06-26 DIAGNOSIS — D61818 Other pancytopenia: Secondary | ICD-10-CM | POA: Diagnosis not present

## 2019-06-26 DIAGNOSIS — I4891 Unspecified atrial fibrillation: Secondary | ICD-10-CM | POA: Diagnosis not present

## 2019-07-01 DIAGNOSIS — Z20828 Contact with and (suspected) exposure to other viral communicable diseases: Secondary | ICD-10-CM | POA: Diagnosis not present

## 2019-07-08 DIAGNOSIS — Z20828 Contact with and (suspected) exposure to other viral communicable diseases: Secondary | ICD-10-CM | POA: Diagnosis not present

## 2019-07-13 DIAGNOSIS — Z23 Encounter for immunization: Secondary | ICD-10-CM | POA: Diagnosis not present

## 2019-07-15 DIAGNOSIS — Z23 Encounter for immunization: Secondary | ICD-10-CM | POA: Diagnosis not present

## 2019-07-22 DIAGNOSIS — Z20828 Contact with and (suspected) exposure to other viral communicable diseases: Secondary | ICD-10-CM | POA: Diagnosis not present

## 2019-07-29 DIAGNOSIS — Z20828 Contact with and (suspected) exposure to other viral communicable diseases: Secondary | ICD-10-CM | POA: Diagnosis not present

## 2019-08-05 DIAGNOSIS — Z20828 Contact with and (suspected) exposure to other viral communicable diseases: Secondary | ICD-10-CM | POA: Diagnosis not present

## 2019-08-11 DIAGNOSIS — R2681 Unsteadiness on feet: Secondary | ICD-10-CM | POA: Diagnosis not present

## 2019-08-12 DIAGNOSIS — R2681 Unsteadiness on feet: Secondary | ICD-10-CM | POA: Diagnosis not present

## 2019-08-13 DIAGNOSIS — B351 Tinea unguium: Secondary | ICD-10-CM | POA: Diagnosis not present

## 2019-08-13 DIAGNOSIS — I739 Peripheral vascular disease, unspecified: Secondary | ICD-10-CM | POA: Diagnosis not present

## 2019-08-13 DIAGNOSIS — Z89512 Acquired absence of left leg below knee: Secondary | ICD-10-CM | POA: Diagnosis not present

## 2019-08-14 DIAGNOSIS — R2681 Unsteadiness on feet: Secondary | ICD-10-CM | POA: Diagnosis not present

## 2019-08-17 DIAGNOSIS — R2681 Unsteadiness on feet: Secondary | ICD-10-CM | POA: Diagnosis not present

## 2019-08-18 DIAGNOSIS — R2681 Unsteadiness on feet: Secondary | ICD-10-CM | POA: Diagnosis not present

## 2019-08-18 IMAGING — CT CT HEAD W/O CM
4 series · 17 of 47 positions shown, 19 images · non-contrast
Comparison: None.

CLINICAL DATA: Altered mental status.  Hypertension and diabetes.

EXAM:
CT HEAD WITHOUT CONTRAST
TECHNIQUE: Contiguous axial images were obtained from the base of the skull
through the vertex without intravenous contrast.

[Series 3: head without · axial · non-contrast · 0.43mm/px · z∈[+350,+485]mm · 7 of 37 slices shown, 9 images]
[im 5/37  brain]
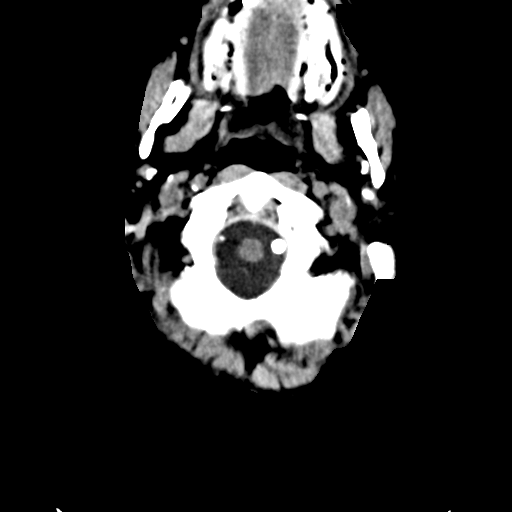
[im 5/37  bone]
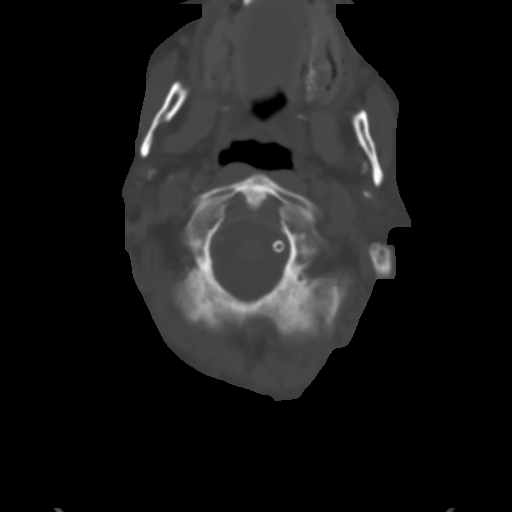
[im 10/37  brain]
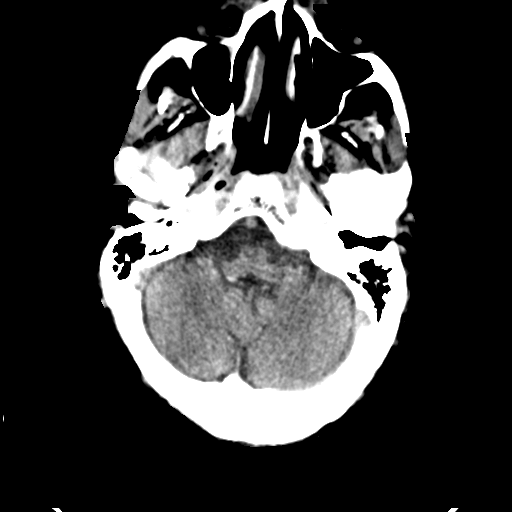
[im 14/37  brain]
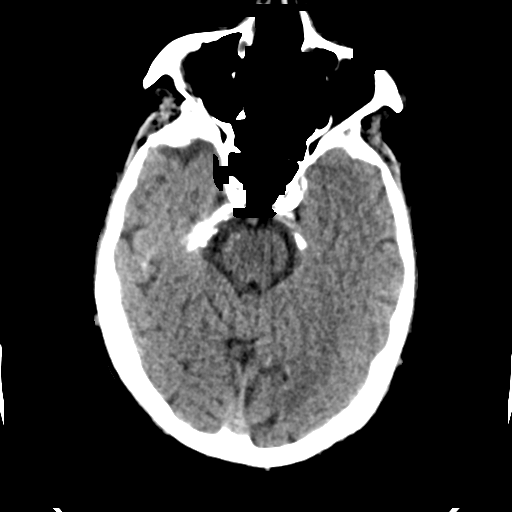
[im 19/37  brain]
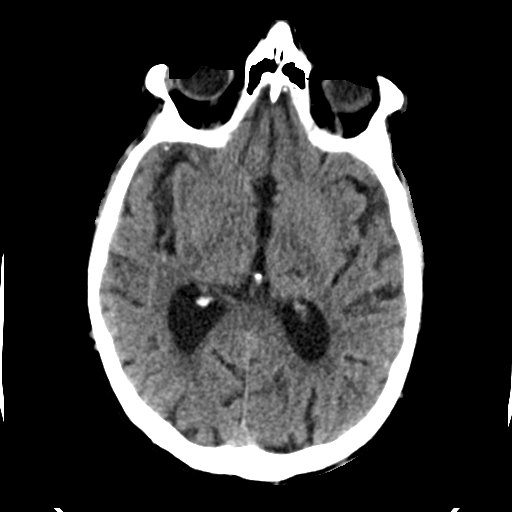
[im 23/37  brain]
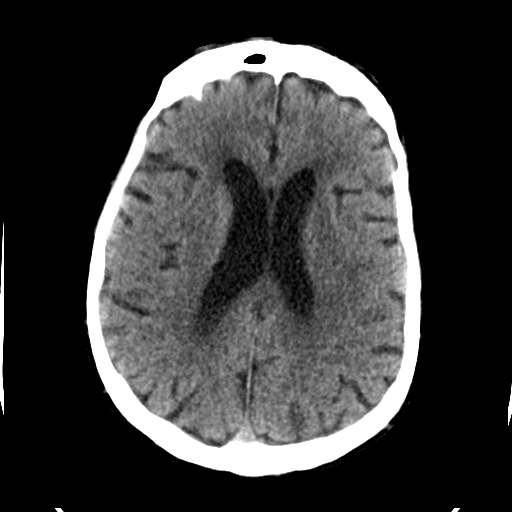
[im 23/37  bone]
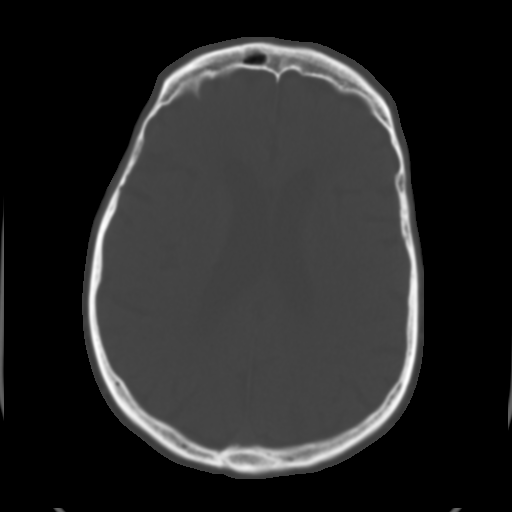
[im 28/37  brain]
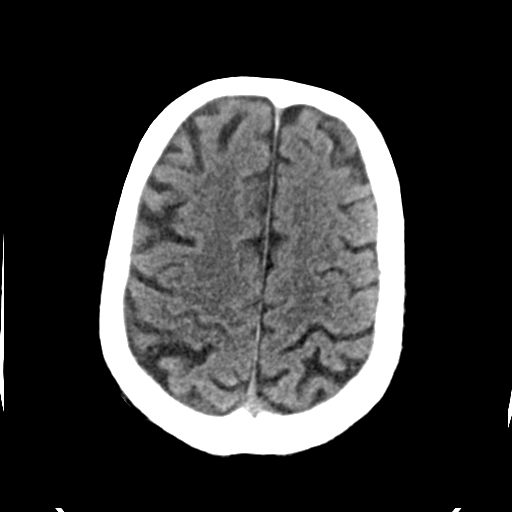
[im 32/37  brain]
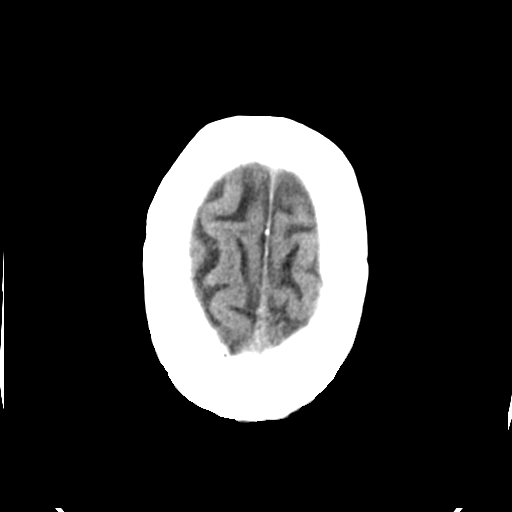

[Series 4: head bone · axial · 0.43mm/px · z∈[+348,+412]mm · 4 of 93 slices shown]
[im 10/93  bone]
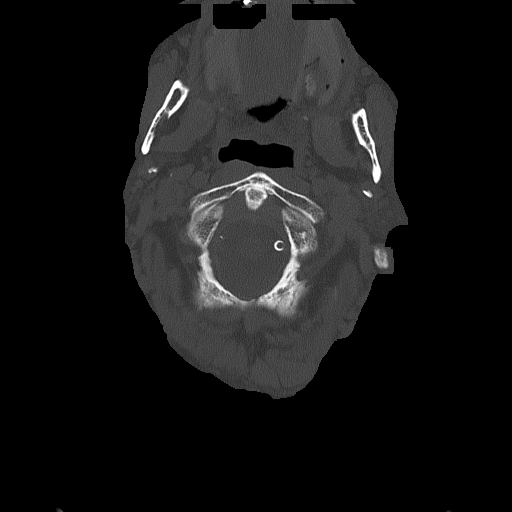
[im 19/93  bone]
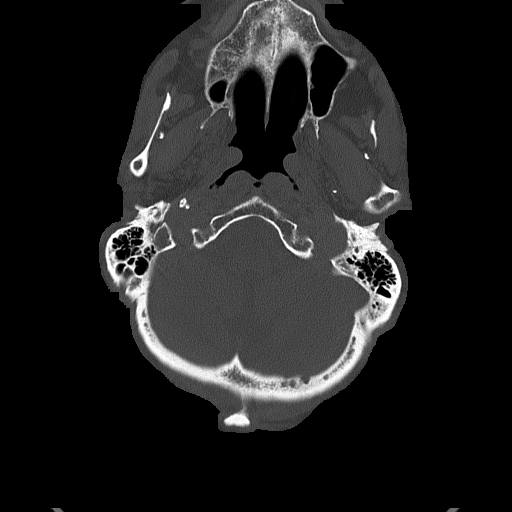
[im 28/93  bone]
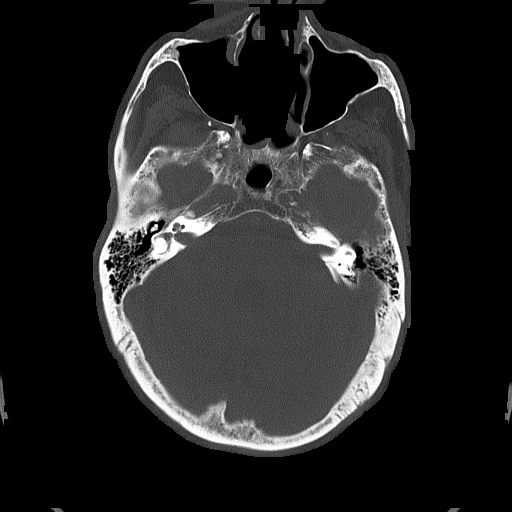
[im 42/93  bone]
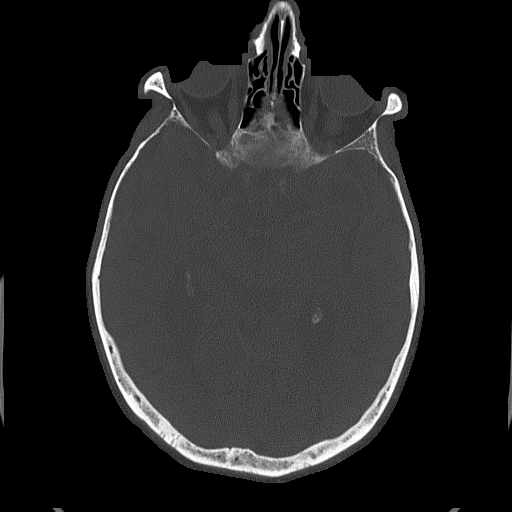

[Series 5: head without cor · coronal · non-contrast · 0.35mm/px · 3 of 74 slices shown]
[im 25/74  brain]
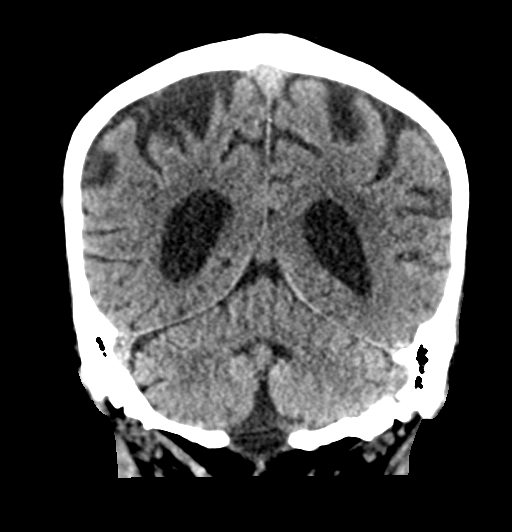
[im 33/74  brain]
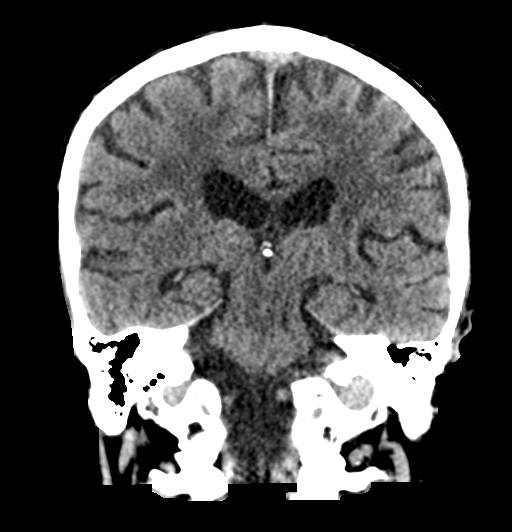
[im 41/74  brain]
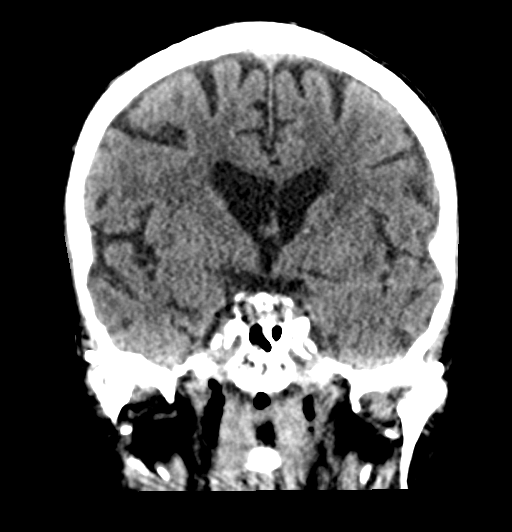

[Series 6: head without sag · sagittal · non-contrast · 0.38mm/px · 3 of 56 slices shown]
[im 19/56  brain]
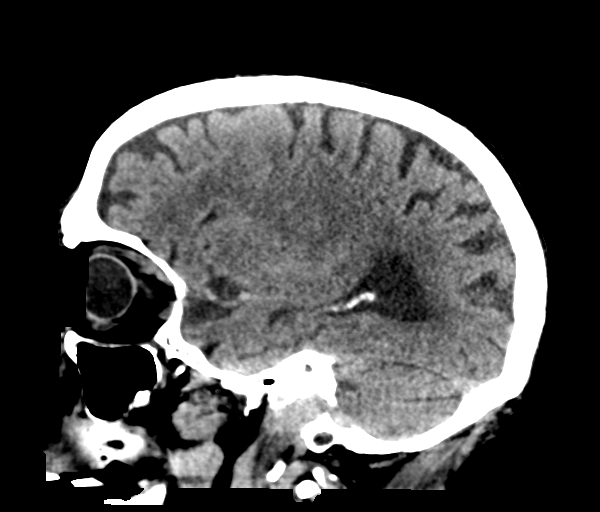
[im 28/56  brain]
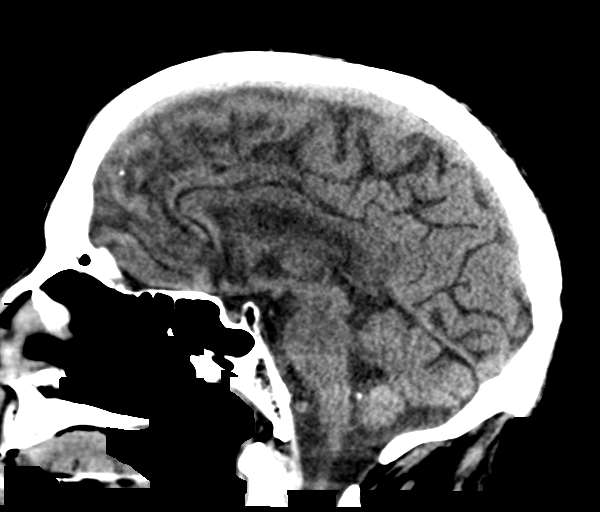
[im 37/56  brain]
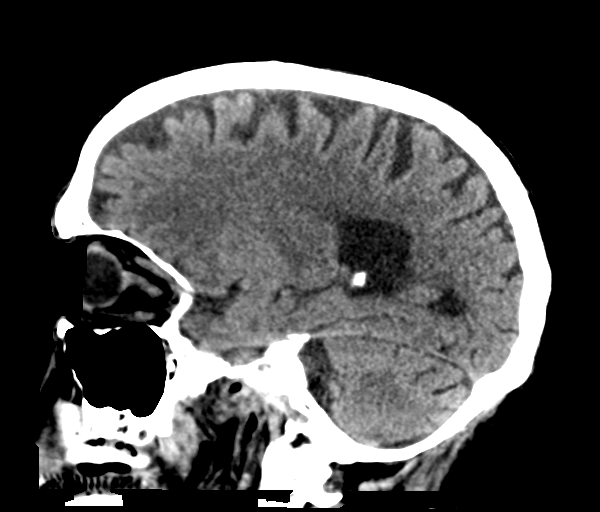

[17 of 47 positions shown; findings below may reference images not displayed]

FINDINGS: Brain: Small remote lacunar infarct of the left thalamus, image
[DATE].

Periventricular white matter and corona radiata hypodensities favor
chronic ischemic microvascular white matter disease.

Otherwise, the brainstem, cerebellum, cerebral peduncles, thalami,
basal ganglia, basilar cisterns, and ventricular system appear
within normal limits. No intracranial hemorrhage, mass lesion, or
acute CVA.

Vascular: Atherosclerotic calcification of the vertebral arteries
(especially the left) with punctate atherosclerotic calcification in
the basilar artery. There is atherosclerotic calcification of the
cavernous carotid arteries bilaterally.

Skull: Unremarkable

Sinuses/Orbits: Unremarkable

Other: No supplemental non-categorized findings.
IMPRESSION: 1. No acute intracranial findings.
2. Periventricular white matter and corona radiata hypodensities
favor chronic ischemic microvascular white matter disease.
3. Small remote left thalamic lacunar infarct.
4. Atherosclerosis.

## 2019-08-19 DIAGNOSIS — R2681 Unsteadiness on feet: Secondary | ICD-10-CM | POA: Diagnosis not present

## 2019-08-20 DIAGNOSIS — R2681 Unsteadiness on feet: Secondary | ICD-10-CM | POA: Diagnosis not present

## 2019-08-24 DIAGNOSIS — R2681 Unsteadiness on feet: Secondary | ICD-10-CM | POA: Diagnosis not present

## 2019-08-26 DIAGNOSIS — E1165 Type 2 diabetes mellitus with hyperglycemia: Secondary | ICD-10-CM | POA: Diagnosis not present

## 2019-08-26 DIAGNOSIS — I4891 Unspecified atrial fibrillation: Secondary | ICD-10-CM | POA: Diagnosis not present

## 2019-08-26 DIAGNOSIS — L02212 Cutaneous abscess of back [any part, except buttock]: Secondary | ICD-10-CM | POA: Diagnosis not present

## 2019-08-26 DIAGNOSIS — D61818 Other pancytopenia: Secondary | ICD-10-CM | POA: Diagnosis not present

## 2019-08-28 DIAGNOSIS — I10 Essential (primary) hypertension: Secondary | ICD-10-CM | POA: Diagnosis not present

## 2019-08-28 DIAGNOSIS — D649 Anemia, unspecified: Secondary | ICD-10-CM | POA: Diagnosis not present

## 2019-08-28 DIAGNOSIS — E119 Type 2 diabetes mellitus without complications: Secondary | ICD-10-CM | POA: Diagnosis not present

## 2019-08-29 DIAGNOSIS — I129 Hypertensive chronic kidney disease with stage 1 through stage 4 chronic kidney disease, or unspecified chronic kidney disease: Secondary | ICD-10-CM | POA: Diagnosis not present

## 2019-08-29 DIAGNOSIS — I4891 Unspecified atrial fibrillation: Secondary | ICD-10-CM | POA: Diagnosis not present

## 2019-08-29 DIAGNOSIS — D61818 Other pancytopenia: Secondary | ICD-10-CM | POA: Diagnosis not present

## 2019-08-29 DIAGNOSIS — N183 Chronic kidney disease, stage 3 unspecified: Secondary | ICD-10-CM | POA: Diagnosis not present

## 2019-09-14 DIAGNOSIS — I110 Hypertensive heart disease with heart failure: Secondary | ICD-10-CM | POA: Diagnosis not present

## 2019-10-15 DIAGNOSIS — Z23 Encounter for immunization: Secondary | ICD-10-CM | POA: Diagnosis not present

## 2019-10-19 DIAGNOSIS — Z20828 Contact with and (suspected) exposure to other viral communicable diseases: Secondary | ICD-10-CM | POA: Diagnosis not present

## 2019-10-20 DIAGNOSIS — I739 Peripheral vascular disease, unspecified: Secondary | ICD-10-CM | POA: Diagnosis not present

## 2019-10-20 DIAGNOSIS — Z89512 Acquired absence of left leg below knee: Secondary | ICD-10-CM | POA: Diagnosis not present

## 2019-10-20 DIAGNOSIS — M2041 Other hammer toe(s) (acquired), right foot: Secondary | ICD-10-CM | POA: Diagnosis not present

## 2019-10-20 DIAGNOSIS — B351 Tinea unguium: Secondary | ICD-10-CM | POA: Diagnosis not present

## 2019-10-26 DIAGNOSIS — Z20828 Contact with and (suspected) exposure to other viral communicable diseases: Secondary | ICD-10-CM | POA: Diagnosis not present

## 2019-11-03 DIAGNOSIS — Z20828 Contact with and (suspected) exposure to other viral communicable diseases: Secondary | ICD-10-CM | POA: Diagnosis not present

## 2019-11-09 DIAGNOSIS — Z20828 Contact with and (suspected) exposure to other viral communicable diseases: Secondary | ICD-10-CM | POA: Diagnosis not present

## 2019-11-12 DIAGNOSIS — Z23 Encounter for immunization: Secondary | ICD-10-CM | POA: Diagnosis not present

## 2019-11-16 DIAGNOSIS — Z20828 Contact with and (suspected) exposure to other viral communicable diseases: Secondary | ICD-10-CM | POA: Diagnosis not present

## 2019-11-30 DIAGNOSIS — I10 Essential (primary) hypertension: Secondary | ICD-10-CM | POA: Diagnosis not present

## 2019-11-30 DIAGNOSIS — E119 Type 2 diabetes mellitus without complications: Secondary | ICD-10-CM | POA: Diagnosis not present

## 2019-11-30 DIAGNOSIS — D649 Anemia, unspecified: Secondary | ICD-10-CM | POA: Diagnosis not present

## 2019-11-30 DIAGNOSIS — Z20828 Contact with and (suspected) exposure to other viral communicable diseases: Secondary | ICD-10-CM | POA: Diagnosis not present

## 2019-12-07 DIAGNOSIS — Z20828 Contact with and (suspected) exposure to other viral communicable diseases: Secondary | ICD-10-CM | POA: Diagnosis not present

## 2019-12-11 ENCOUNTER — Telehealth: Payer: Self-pay

## 2019-12-11 NOTE — Telephone Encounter (Signed)
Called the number listed for the patient in the visit notes section to make patient aware that someone will be giving him a call 15-20 before his virtual visit with Roby Lofts, PA-C and the person who answered inform me that he is the patient's son and that the patient is in a nursing facility by the name of [facility]. Mali Groom stated he was aware of the appointment and that I needed to call the nursing home to make them aware for he will not be there to assist with the virtual call.

## 2019-12-11 NOTE — Telephone Encounter (Signed)
Called the nursing home to speak with the nurse and or Social worker to see if anyone was aware of the upcoming appointment with Roby Lofts, PA-C. The receptionist informed me that the Social worker is not in today and will be back in on Monday and that the Activities also helps with virtual visits. I gave her my information and why I was calling in hope that someone can give me a call back today. The Social worker was not available so I left a detailed voice message with my information, the date and time of the appointment for her to get back to me so that we can go about getting the patient set up for his virtual or to have him rescheduled to be seen int he office.

## 2019-12-14 ENCOUNTER — Telehealth: Payer: Medicare Other | Admitting: Medical

## 2019-12-14 DIAGNOSIS — Z20828 Contact with and (suspected) exposure to other viral communicable diseases: Secondary | ICD-10-CM | POA: Diagnosis not present

## 2019-12-16 ENCOUNTER — Telehealth (INDEPENDENT_AMBULATORY_CARE_PROVIDER_SITE_OTHER): Payer: Medicare Other | Admitting: Cardiovascular Disease

## 2019-12-16 ENCOUNTER — Encounter: Payer: Self-pay | Admitting: Cardiovascular Disease

## 2019-12-16 VITALS — BP 139/62 | HR 60 | Temp 96.9°F | Ht 71.0 in | Wt 138.0 lb

## 2019-12-16 DIAGNOSIS — I5042 Chronic combined systolic (congestive) and diastolic (congestive) heart failure: Secondary | ICD-10-CM

## 2019-12-16 DIAGNOSIS — I2510 Atherosclerotic heart disease of native coronary artery without angina pectoris: Secondary | ICD-10-CM | POA: Diagnosis not present

## 2019-12-16 DIAGNOSIS — I739 Peripheral vascular disease, unspecified: Secondary | ICD-10-CM

## 2019-12-16 DIAGNOSIS — E1151 Type 2 diabetes mellitus with diabetic peripheral angiopathy without gangrene: Secondary | ICD-10-CM | POA: Diagnosis not present

## 2019-12-16 DIAGNOSIS — J449 Chronic obstructive pulmonary disease, unspecified: Secondary | ICD-10-CM | POA: Diagnosis not present

## 2019-12-16 DIAGNOSIS — Z89512 Acquired absence of left leg below knee: Secondary | ICD-10-CM

## 2019-12-16 DIAGNOSIS — I10 Essential (primary) hypertension: Secondary | ICD-10-CM

## 2019-12-16 DIAGNOSIS — I4819 Other persistent atrial fibrillation: Secondary | ICD-10-CM

## 2019-12-16 NOTE — Patient Instructions (Signed)

## 2019-12-16 NOTE — Progress Notes (Signed)
Virtual Visit via Telephone Note   This visit type was conducted due to national recommendations for restrictions regarding the COVID-19 Pandemic (e.g. social distancing) in an effort to limit this patient's exposure and mitigate transmission in our community.  Due to his co-morbid illnesses, this patient is at least at moderate risk for complications without adequate follow up.  This format is felt to be most appropriate for this patient at this time.  The patient did not have access to video technology/had technical difficulties with video requiring transitioning to audio format only (telephone).  All issues noted in this document were discussed and addressed.  No physical exam could be performed with this format.  Please refer to the patient's chart for his  consent to telehealth for [facility].   The patient was identified using 2 identifiers.  Date:  12/16/2019   ID:  Derek Hinton, DOB September 20, 1946, MRN [id number]  Patient Location: [place] Provider Location: Home  PCP:  Guadalupe Maple, MD  Cardiologist:  Sanda Klein, MD  Electrophysiologist:  None   Evaluation Performed:  Follow-Up Visit  Chief Complaint:  CHF, CAD, AFib  History of Present Illness:    Derek Hinton is a 74 y.o. male with CHF (EF 15-20%), CAD (severe 3-vessel cath 2019, medical mgmt), PAD (PTA R external iliac Oct 2019), s/p left BKA, long-term persistent atrial fibrillation, DM, COPD, history of ischemic CVA, squamous cell carcinoma of the skin of the anterior right thigh.  We had a phone conversation with the assistance of his nurse, Lilli Few.  He reports that he had labs a couple of weeks ago with Dr. Luana Shu.  He feels well and does not have any complaints.  He is able to walk with his prosthesis and his walker or scooter around in his wheelchair.  He denies any problems with angina or shortness of breath with these activities. He is not using supplemental oxygen. He does not have  edema of his intact limb or of the amputation stump.  He has not had any new focal neurological events.  He bleeds easily if he scratches himself, but has not had serious overt bleeding events.  He denies palpitations, dizziness and syncope.  His weight stays within a couple of pounds of 138 pounds, which we had established as his "dry weight".  He has not required diuretic dose adjustment.  The patient does not have symptoms concerning for COVID-19 infection (fever, chills, cough, or new shortness of breath).    Past Medical History:  Diagnosis Date  . COPD (chronic obstructive pulmonary disease) (HCC)    not on home O2  . Diabetes mellitus without complication ([place])   . Hypertension   . PVD (peripheral vascular disease) ([place])    s/p L BKA   Past Surgical History:  Procedure Laterality Date  . ABDOMINAL AORTOGRAM W/LOWER EXTREMITY N/A 07/18/2018   Procedure: ABDOMINAL AORTOGRAM W/LOWER EXTREMITY;  Surgeon: Angelia Mould, MD;  Location: [place] [facility];  Service: Cardiovascular;  Laterality: N/A;  . BELOW KNEE LEG AMPUTATION    . PERIPHERAL VASCULAR INTERVENTION Right 07/18/2018   Procedure: PERIPHERAL VASCULAR INTERVENTION;  Surgeon: Angelia Mould, MD;  Location: [facility];  Service: Cardiovascular;  Laterality: Right;  . RIGHT/LEFT HEART CATH AND CORONARY ANGIOGRAPHY N/A 07/21/2018   Procedure: RIGHT/LEFT HEART CATH AND CORONARY ANGIOGRAPHY;  Surgeon: Jettie Booze, MD;  Location: [place] [facility];  Service: Cardiovascular;  Laterality: N/A;     Current Meds  Medication Sig  .  aspirin 81 MG tablet Take 81 mg by mouth daily.  Marland Kitchen atorvastatin (LIPITOR) 40 MG tablet Take 1 tablet (40 mg total) by mouth daily.  Marland Kitchen BREO ELLIPTA 100-25 MCG/INH AEPB Inhale 2 puffs into the lungs as directed.  Marland Kitchen FARXIGA 10 MG TABS tablet   . furosemide (LASIX) 40 MG tablet Take 80mg  in the morning and 40mg  in the pm (Patient taking differently: 80 mg. Take 80mg  in the  morning)  . glipiZIDE (GLUCOTROL XL) 5 MG 24 hr tablet Take 5 mg by mouth daily.  . iron polysaccharides (NIFEREX) 150 MG capsule Take 150 mg by mouth daily.  . metFORMIN (GLUCOPHAGE) 1000 MG tablet Take 500 mg by mouth 2 (two) times daily.   . Multiple Vitamin (MULTIVITAMIN WITH MINERALS) TABS tablet Take 1 tablet by mouth daily.  Marland Kitchen omeprazole (PRILOSEC) 20 MG capsule Take 1 capsule by mouth 2 (two) times daily before a meal.   . sacubitril-valsartan (ENTRESTO) 49-51 MG Take 1 tablet by mouth 2 (two) times daily.     Allergies:   Penicillins   Social History   Tobacco Use  . Smoking status: Former Smoker    Packs/day: 1.00    Years: 60.00    Pack years: 60.00    Quit date: 12/2017    Years since quitting: 2.0  . Smokeless tobacco: Never Used  Substance Use Topics  . Alcohol use: Not Currently  . Drug use: Never     Family Hx: The patient's family history includes Diabetes in his father.  ROS:   Please see the history of present illness.    All other systems reviewed and are negative.   Prior CV studies:   The following studies were reviewed today: Labs/Other Tests and Data Reviewed:    EKG:  No ECG reviewed.  Recent Labs: No results found for requested labs within last 8760 hours.   Recent Lipid Panel No results found for: CHOL, TRIG, HDL, CHOLHDL, LDLCALC, LDLDIRECT    11/30/2019 labs Potassium 4.7, glucose 185, creatinine 1.75, normal liver function tests Hemoglobin 10.2  Wt Readings from Last 3 Encounters:  12/16/19 138 lb (62.6 kg)  06/10/19 136 lb 6.4 oz (61.9 kg)  11/24/18 148 lb 12.8 oz (67.5 kg)     Objective:    Vital Signs:  BP 139/62   Pulse 60   Temp (!) 96.9 F (36.1 C)   Ht 5\' 11"  (1.803 m)   Wt 138 lb (62.6 kg)   BMI 19.25 kg/m    VITAL SIGNS:  reviewed Unable to examine  ASSESSMENT & PLAN:    1. Chronic combined systolic and diastolic heart failure (HCC)   2. Persistent atrial fibrillation ([place])   3. Coronary artery disease  involving native coronary artery of native heart without angina pectoris   4. PAD (peripheral artery disease) ([place])   5. Left below-knee amputee ([place])   6. Chronic obstructive pulmonary disease, unspecified COPD type ([place])   7. Essential hypertension   8. Type 2 diabetes mellitus with peripheral vascular disease ([place])     1. CHF:  With the limitations of the following visit, he appears to be euvolemic.  He is on Entresto, beta-blockers, loop diuretic and SGLT2 inhibitor.  Weight is stable.  Asymptomatic.  No changes in medications. 2. AFib:  Appears to be asymptomatic and well rate controlled. CHADSVasc 8 (age 54, DM, CAD, CHF, HTN, stroke on imaging studies).   He is on dual antiplatelet therapy due to peripheral arterial revascularization and coronary artery disease.  It is felt that the risk of combination antiplatelet/anticoagulant therapy is too risky, but we may have to reconsider that if he has new neurological events. 3. CAD:  Fairly sedentary, angina free on beta-blocker antianginal therapy. 4. PAD:  Denies claudication. 5. L BKA:  Can walk for short distances with his prosthesis and a walker. 6. COPD:  No longer requiring oxygen supplementation. 7. HTN:  Good control. 8. DM:  Request labs from PCP.  COVID-19 Education: The signs and symptoms of COVID-19 were discussed with the patient and how to seek care for testing (follow up with PCP or arrange E-visit).  The importance of social distancing was discussed today.  Time:   Today, I have spent 17 minutes with the patient with telehealth technology discussing the above problems.     Medication Adjustments/Labs and Tests Ordered: Current medicines are reviewed at length with the patient today.  Concerns regarding medicines are outlined above.   Tests Ordered: No orders of the defined types were placed in this encounter.   Medication Changes: No orders of the defined types were placed in this encounter.   Follow Up:  In Person  6 months  Signed, Sanda Klein, MD  12/16/2019 9:14 AM    [facility]

## 2019-12-21 DIAGNOSIS — Z20828 Contact with and (suspected) exposure to other viral communicable diseases: Secondary | ICD-10-CM | POA: Diagnosis not present

## 2019-12-26 DIAGNOSIS — N183 Chronic kidney disease, stage 3 unspecified: Secondary | ICD-10-CM | POA: Diagnosis not present

## 2019-12-26 DIAGNOSIS — I129 Hypertensive chronic kidney disease with stage 1 through stage 4 chronic kidney disease, or unspecified chronic kidney disease: Secondary | ICD-10-CM | POA: Diagnosis not present

## 2019-12-26 DIAGNOSIS — C439 Malignant melanoma of skin, unspecified: Secondary | ICD-10-CM | POA: Diagnosis not present

## 2019-12-26 DIAGNOSIS — E118 Type 2 diabetes mellitus with unspecified complications: Secondary | ICD-10-CM | POA: Diagnosis not present

## 2019-12-26 DIAGNOSIS — J449 Chronic obstructive pulmonary disease, unspecified: Secondary | ICD-10-CM | POA: Diagnosis not present

## 2019-12-26 DIAGNOSIS — D61818 Other pancytopenia: Secondary | ICD-10-CM | POA: Diagnosis not present

## 2019-12-26 DIAGNOSIS — I509 Heart failure, unspecified: Secondary | ICD-10-CM | POA: Diagnosis not present

## 2020-01-01 ENCOUNTER — Ambulatory Visit: Payer: Medicare Other | Admitting: Cardiovascular Disease

## 2020-01-07 DIAGNOSIS — H903 Sensorineural hearing loss, bilateral: Secondary | ICD-10-CM | POA: Diagnosis not present

## 2020-01-11 DIAGNOSIS — Z20828 Contact with and (suspected) exposure to other viral communicable diseases: Secondary | ICD-10-CM | POA: Diagnosis not present

## 2020-01-15 DIAGNOSIS — B351 Tinea unguium: Secondary | ICD-10-CM | POA: Diagnosis not present

## 2020-01-15 DIAGNOSIS — E1159 Type 2 diabetes mellitus with other circulatory complications: Secondary | ICD-10-CM | POA: Diagnosis not present

## 2020-01-16 DIAGNOSIS — I129 Hypertensive chronic kidney disease with stage 1 through stage 4 chronic kidney disease, or unspecified chronic kidney disease: Secondary | ICD-10-CM | POA: Diagnosis not present

## 2020-01-16 DIAGNOSIS — J449 Chronic obstructive pulmonary disease, unspecified: Secondary | ICD-10-CM | POA: Diagnosis not present

## 2020-01-16 DIAGNOSIS — E1122 Type 2 diabetes mellitus with diabetic chronic kidney disease: Secondary | ICD-10-CM | POA: Diagnosis not present

## 2020-01-16 DIAGNOSIS — D61818 Other pancytopenia: Secondary | ICD-10-CM | POA: Diagnosis not present

## 2020-01-16 DIAGNOSIS — I509 Heart failure, unspecified: Secondary | ICD-10-CM | POA: Diagnosis not present

## 2020-01-16 DIAGNOSIS — N183 Chronic kidney disease, stage 3 unspecified: Secondary | ICD-10-CM | POA: Diagnosis not present

## 2020-01-18 DIAGNOSIS — Z20828 Contact with and (suspected) exposure to other viral communicable diseases: Secondary | ICD-10-CM | POA: Diagnosis not present

## 2020-01-20 DIAGNOSIS — I110 Hypertensive heart disease with heart failure: Secondary | ICD-10-CM | POA: Diagnosis not present

## 2020-01-25 DIAGNOSIS — Z20828 Contact with and (suspected) exposure to other viral communicable diseases: Secondary | ICD-10-CM | POA: Diagnosis not present

## 2020-01-28 DIAGNOSIS — E1122 Type 2 diabetes mellitus with diabetic chronic kidney disease: Secondary | ICD-10-CM | POA: Diagnosis not present

## 2020-01-28 DIAGNOSIS — J449 Chronic obstructive pulmonary disease, unspecified: Secondary | ICD-10-CM | POA: Diagnosis not present

## 2020-01-28 DIAGNOSIS — D61818 Other pancytopenia: Secondary | ICD-10-CM | POA: Diagnosis not present

## 2020-01-28 DIAGNOSIS — I129 Hypertensive chronic kidney disease with stage 1 through stage 4 chronic kidney disease, or unspecified chronic kidney disease: Secondary | ICD-10-CM | POA: Diagnosis not present

## 2020-01-28 DIAGNOSIS — N183 Chronic kidney disease, stage 3 unspecified: Secondary | ICD-10-CM | POA: Diagnosis not present

## 2020-01-28 DIAGNOSIS — I509 Heart failure, unspecified: Secondary | ICD-10-CM | POA: Diagnosis not present

## 2020-02-01 DIAGNOSIS — Z20828 Contact with and (suspected) exposure to other viral communicable diseases: Secondary | ICD-10-CM | POA: Diagnosis not present

## 2020-02-26 DIAGNOSIS — E119 Type 2 diabetes mellitus without complications: Secondary | ICD-10-CM | POA: Diagnosis not present

## 2020-02-26 DIAGNOSIS — I10 Essential (primary) hypertension: Secondary | ICD-10-CM | POA: Diagnosis not present

## 2020-02-26 DIAGNOSIS — D649 Anemia, unspecified: Secondary | ICD-10-CM | POA: Diagnosis not present

## 2020-04-14 DIAGNOSIS — J449 Chronic obstructive pulmonary disease, unspecified: Secondary | ICD-10-CM | POA: Diagnosis not present

## 2020-04-14 DIAGNOSIS — E1122 Type 2 diabetes mellitus with diabetic chronic kidney disease: Secondary | ICD-10-CM | POA: Diagnosis not present

## 2020-04-14 DIAGNOSIS — D61818 Other pancytopenia: Secondary | ICD-10-CM | POA: Diagnosis not present

## 2020-04-14 DIAGNOSIS — I509 Heart failure, unspecified: Secondary | ICD-10-CM | POA: Diagnosis not present

## 2020-04-14 DIAGNOSIS — N454 Abscess of epididymis or testis: Secondary | ICD-10-CM | POA: Diagnosis not present

## 2020-04-14 DIAGNOSIS — I129 Hypertensive chronic kidney disease with stage 1 through stage 4 chronic kidney disease, or unspecified chronic kidney disease: Secondary | ICD-10-CM | POA: Diagnosis not present

## 2020-04-14 DIAGNOSIS — N183 Chronic kidney disease, stage 3 unspecified: Secondary | ICD-10-CM | POA: Diagnosis not present

## 2020-04-21 DIAGNOSIS — I509 Heart failure, unspecified: Secondary | ICD-10-CM | POA: Diagnosis not present

## 2020-04-21 DIAGNOSIS — N454 Abscess of epididymis or testis: Secondary | ICD-10-CM | POA: Diagnosis not present

## 2020-04-21 DIAGNOSIS — E1122 Type 2 diabetes mellitus with diabetic chronic kidney disease: Secondary | ICD-10-CM | POA: Diagnosis not present

## 2020-04-21 DIAGNOSIS — N183 Chronic kidney disease, stage 3 unspecified: Secondary | ICD-10-CM | POA: Diagnosis not present

## 2020-04-21 DIAGNOSIS — J449 Chronic obstructive pulmonary disease, unspecified: Secondary | ICD-10-CM | POA: Diagnosis not present

## 2020-04-21 DIAGNOSIS — D61818 Other pancytopenia: Secondary | ICD-10-CM | POA: Diagnosis not present

## 2020-04-21 DIAGNOSIS — I129 Hypertensive chronic kidney disease with stage 1 through stage 4 chronic kidney disease, or unspecified chronic kidney disease: Secondary | ICD-10-CM | POA: Diagnosis not present

## 2020-05-30 DIAGNOSIS — D649 Anemia, unspecified: Secondary | ICD-10-CM | POA: Diagnosis not present

## 2020-05-30 DIAGNOSIS — E119 Type 2 diabetes mellitus without complications: Secondary | ICD-10-CM | POA: Diagnosis not present

## 2020-05-30 DIAGNOSIS — I10 Essential (primary) hypertension: Secondary | ICD-10-CM | POA: Diagnosis not present

## 2020-06-11 DIAGNOSIS — D61818 Other pancytopenia: Secondary | ICD-10-CM | POA: Diagnosis not present

## 2020-06-11 DIAGNOSIS — E1122 Type 2 diabetes mellitus with diabetic chronic kidney disease: Secondary | ICD-10-CM | POA: Diagnosis not present

## 2020-06-11 DIAGNOSIS — I129 Hypertensive chronic kidney disease with stage 1 through stage 4 chronic kidney disease, or unspecified chronic kidney disease: Secondary | ICD-10-CM | POA: Diagnosis not present

## 2020-06-11 DIAGNOSIS — J449 Chronic obstructive pulmonary disease, unspecified: Secondary | ICD-10-CM | POA: Diagnosis not present

## 2020-06-11 DIAGNOSIS — N183 Chronic kidney disease, stage 3 unspecified: Secondary | ICD-10-CM | POA: Diagnosis not present

## 2020-06-11 DIAGNOSIS — I509 Heart failure, unspecified: Secondary | ICD-10-CM | POA: Diagnosis not present

## 2020-06-17 DIAGNOSIS — E1159 Type 2 diabetes mellitus with other circulatory complications: Secondary | ICD-10-CM | POA: Diagnosis not present

## 2020-06-17 DIAGNOSIS — B351 Tinea unguium: Secondary | ICD-10-CM | POA: Diagnosis not present

## 2020-06-22 DIAGNOSIS — E1122 Type 2 diabetes mellitus with diabetic chronic kidney disease: Secondary | ICD-10-CM | POA: Diagnosis not present

## 2020-06-22 DIAGNOSIS — N183 Chronic kidney disease, stage 3 unspecified: Secondary | ICD-10-CM | POA: Diagnosis not present

## 2020-06-22 DIAGNOSIS — I509 Heart failure, unspecified: Secondary | ICD-10-CM | POA: Diagnosis not present

## 2020-06-22 DIAGNOSIS — J449 Chronic obstructive pulmonary disease, unspecified: Secondary | ICD-10-CM | POA: Diagnosis not present

## 2020-06-22 DIAGNOSIS — D61818 Other pancytopenia: Secondary | ICD-10-CM | POA: Diagnosis not present

## 2020-06-22 DIAGNOSIS — R8271 Bacteriuria: Secondary | ICD-10-CM | POA: Diagnosis not present

## 2020-06-22 DIAGNOSIS — B9620 Unspecified Escherichia coli [E. coli] as the cause of diseases classified elsewhere: Secondary | ICD-10-CM | POA: Diagnosis not present

## 2020-06-22 DIAGNOSIS — I129 Hypertensive chronic kidney disease with stage 1 through stage 4 chronic kidney disease, or unspecified chronic kidney disease: Secondary | ICD-10-CM | POA: Diagnosis not present

## 2020-06-22 DIAGNOSIS — L02214 Cutaneous abscess of groin: Secondary | ICD-10-CM | POA: Diagnosis not present

## 2020-06-22 DIAGNOSIS — N390 Urinary tract infection, site not specified: Secondary | ICD-10-CM | POA: Diagnosis not present

## 2020-06-24 DIAGNOSIS — L02214 Cutaneous abscess of groin: Secondary | ICD-10-CM | POA: Diagnosis not present

## 2020-07-05 DIAGNOSIS — L02214 Cutaneous abscess of groin: Secondary | ICD-10-CM | POA: Diagnosis not present

## 2020-07-06 DIAGNOSIS — I130 Hypertensive heart and chronic kidney disease with heart failure and stage 1 through stage 4 chronic kidney disease, or unspecified chronic kidney disease: Secondary | ICD-10-CM | POA: Diagnosis present

## 2020-07-06 DIAGNOSIS — Z8673 Personal history of transient ischemic attack (TIA), and cerebral infarction without residual deficits: Secondary | ICD-10-CM | POA: Diagnosis not present

## 2020-07-06 DIAGNOSIS — E119 Type 2 diabetes mellitus without complications: Secondary | ICD-10-CM | POA: Diagnosis not present

## 2020-07-06 DIAGNOSIS — M2550 Pain in unspecified joint: Secondary | ICD-10-CM | POA: Diagnosis not present

## 2020-07-06 DIAGNOSIS — Z20822 Contact with and (suspected) exposure to covid-19: Secondary | ICD-10-CM | POA: Diagnosis not present

## 2020-07-06 DIAGNOSIS — E785 Hyperlipidemia, unspecified: Secondary | ICD-10-CM | POA: Diagnosis present

## 2020-07-06 DIAGNOSIS — N454 Abscess of epididymis or testis: Secondary | ICD-10-CM | POA: Diagnosis not present

## 2020-07-06 DIAGNOSIS — Z89512 Acquired absence of left leg below knee: Secondary | ICD-10-CM | POA: Diagnosis not present

## 2020-07-06 DIAGNOSIS — E1151 Type 2 diabetes mellitus with diabetic peripheral angiopathy without gangrene: Secondary | ICD-10-CM | POA: Diagnosis present

## 2020-07-06 DIAGNOSIS — E1122 Type 2 diabetes mellitus with diabetic chronic kidney disease: Secondary | ICD-10-CM | POA: Diagnosis present

## 2020-07-06 DIAGNOSIS — I480 Paroxysmal atrial fibrillation: Secondary | ICD-10-CM | POA: Diagnosis present

## 2020-07-06 DIAGNOSIS — E861 Hypovolemia: Secondary | ICD-10-CM | POA: Diagnosis present

## 2020-07-06 DIAGNOSIS — I959 Hypotension, unspecified: Secondary | ICD-10-CM | POA: Diagnosis present

## 2020-07-06 DIAGNOSIS — I5030 Unspecified diastolic (congestive) heart failure: Secondary | ICD-10-CM | POA: Diagnosis not present

## 2020-07-06 DIAGNOSIS — I5022 Chronic systolic (congestive) heart failure: Secondary | ICD-10-CM | POA: Diagnosis not present

## 2020-07-06 DIAGNOSIS — D649 Anemia, unspecified: Secondary | ICD-10-CM | POA: Diagnosis present

## 2020-07-06 DIAGNOSIS — J449 Chronic obstructive pulmonary disease, unspecified: Secondary | ICD-10-CM | POA: Diagnosis present

## 2020-07-06 DIAGNOSIS — N179 Acute kidney failure, unspecified: Secondary | ICD-10-CM | POA: Diagnosis not present

## 2020-07-06 DIAGNOSIS — I129 Hypertensive chronic kidney disease with stage 1 through stage 4 chronic kidney disease, or unspecified chronic kidney disease: Secondary | ICD-10-CM | POA: Diagnosis not present

## 2020-07-06 DIAGNOSIS — E875 Hyperkalemia: Secondary | ICD-10-CM | POA: Diagnosis not present

## 2020-07-06 DIAGNOSIS — I10 Essential (primary) hypertension: Secondary | ICD-10-CM | POA: Diagnosis not present

## 2020-07-06 DIAGNOSIS — I110 Hypertensive heart disease with heart failure: Secondary | ICD-10-CM | POA: Diagnosis not present

## 2020-07-06 DIAGNOSIS — I4891 Unspecified atrial fibrillation: Secondary | ICD-10-CM | POA: Diagnosis not present

## 2020-07-06 DIAGNOSIS — R001 Bradycardia, unspecified: Secondary | ICD-10-CM | POA: Diagnosis present

## 2020-07-06 DIAGNOSIS — J90 Pleural effusion, not elsewhere classified: Secondary | ICD-10-CM | POA: Diagnosis not present

## 2020-07-06 DIAGNOSIS — Z7984 Long term (current) use of oral hypoglycemic drugs: Secondary | ICD-10-CM | POA: Diagnosis not present

## 2020-07-06 DIAGNOSIS — F17210 Nicotine dependence, cigarettes, uncomplicated: Secondary | ICD-10-CM | POA: Diagnosis present

## 2020-07-06 DIAGNOSIS — N492 Inflammatory disorders of scrotum: Secondary | ICD-10-CM | POA: Diagnosis present

## 2020-07-06 DIAGNOSIS — N183 Chronic kidney disease, stage 3 unspecified: Secondary | ICD-10-CM | POA: Diagnosis not present

## 2020-07-06 DIAGNOSIS — E872 Acidosis: Secondary | ICD-10-CM | POA: Diagnosis not present

## 2020-07-06 DIAGNOSIS — Z79899 Other long term (current) drug therapy: Secondary | ICD-10-CM | POA: Diagnosis not present

## 2020-07-06 DIAGNOSIS — R079 Chest pain, unspecified: Secondary | ICD-10-CM | POA: Diagnosis not present

## 2020-07-06 DIAGNOSIS — I447 Left bundle-branch block, unspecified: Secondary | ICD-10-CM | POA: Diagnosis not present

## 2020-07-06 DIAGNOSIS — Z20828 Contact with and (suspected) exposure to other viral communicable diseases: Secondary | ICD-10-CM | POA: Diagnosis not present

## 2020-07-06 DIAGNOSIS — R799 Abnormal finding of blood chemistry, unspecified: Secondary | ICD-10-CM | POA: Diagnosis not present

## 2020-07-06 DIAGNOSIS — N2589 Other disorders resulting from impaired renal tubular function: Secondary | ICD-10-CM | POA: Diagnosis present

## 2020-07-06 DIAGNOSIS — R531 Weakness: Secondary | ICD-10-CM | POA: Diagnosis not present

## 2020-07-06 DIAGNOSIS — N189 Chronic kidney disease, unspecified: Secondary | ICD-10-CM | POA: Diagnosis present

## 2020-07-06 DIAGNOSIS — I509 Heart failure, unspecified: Secondary | ICD-10-CM | POA: Diagnosis not present

## 2020-07-06 DIAGNOSIS — Z7401 Bed confinement status: Secondary | ICD-10-CM | POA: Diagnosis not present

## 2020-07-06 DIAGNOSIS — I2510 Atherosclerotic heart disease of native coronary artery without angina pectoris: Secondary | ICD-10-CM | POA: Diagnosis present

## 2020-07-06 DIAGNOSIS — E871 Hypo-osmolality and hyponatremia: Secondary | ICD-10-CM | POA: Diagnosis present

## 2020-07-06 DIAGNOSIS — D61818 Other pancytopenia: Secondary | ICD-10-CM | POA: Diagnosis not present

## 2020-07-06 DIAGNOSIS — R404 Transient alteration of awareness: Secondary | ICD-10-CM | POA: Diagnosis not present

## 2020-07-06 DIAGNOSIS — Z89021 Acquired absence of right finger(s): Secondary | ICD-10-CM | POA: Diagnosis not present

## 2020-07-06 DIAGNOSIS — Z66 Do not resuscitate: Secondary | ICD-10-CM | POA: Diagnosis not present

## 2020-07-06 DIAGNOSIS — R7989 Other specified abnormal findings of blood chemistry: Secondary | ICD-10-CM | POA: Diagnosis not present

## 2020-07-10 DIAGNOSIS — I129 Hypertensive chronic kidney disease with stage 1 through stage 4 chronic kidney disease, or unspecified chronic kidney disease: Secondary | ICD-10-CM | POA: Diagnosis not present

## 2020-07-10 DIAGNOSIS — E1122 Type 2 diabetes mellitus with diabetic chronic kidney disease: Secondary | ICD-10-CM | POA: Diagnosis not present

## 2020-07-10 DIAGNOSIS — R079 Chest pain, unspecified: Secondary | ICD-10-CM | POA: Diagnosis not present

## 2020-07-10 DIAGNOSIS — E875 Hyperkalemia: Secondary | ICD-10-CM | POA: Diagnosis not present

## 2020-07-10 DIAGNOSIS — N183 Chronic kidney disease, stage 3 unspecified: Secondary | ICD-10-CM | POA: Diagnosis not present

## 2020-07-12 DIAGNOSIS — M6281 Muscle weakness (generalized): Secondary | ICD-10-CM | POA: Diagnosis not present

## 2020-07-12 DIAGNOSIS — R2681 Unsteadiness on feet: Secondary | ICD-10-CM | POA: Diagnosis not present

## 2020-07-13 DIAGNOSIS — M6281 Muscle weakness (generalized): Secondary | ICD-10-CM | POA: Diagnosis not present

## 2020-07-13 DIAGNOSIS — R2681 Unsteadiness on feet: Secondary | ICD-10-CM | POA: Diagnosis not present

## 2020-07-14 DIAGNOSIS — M6281 Muscle weakness (generalized): Secondary | ICD-10-CM | POA: Diagnosis not present

## 2020-07-14 DIAGNOSIS — R2681 Unsteadiness on feet: Secondary | ICD-10-CM | POA: Diagnosis not present

## 2020-07-15 DIAGNOSIS — M6281 Muscle weakness (generalized): Secondary | ICD-10-CM | POA: Diagnosis not present

## 2020-07-15 DIAGNOSIS — R2681 Unsteadiness on feet: Secondary | ICD-10-CM | POA: Diagnosis not present

## 2020-07-18 DIAGNOSIS — R2681 Unsteadiness on feet: Secondary | ICD-10-CM | POA: Diagnosis not present

## 2020-07-18 DIAGNOSIS — M6281 Muscle weakness (generalized): Secondary | ICD-10-CM | POA: Diagnosis not present

## 2020-07-19 NOTE — Progress Notes (Signed)
Cardiology Office Note   Date:  07/21/2020   ID:  Derek Hinton, DOB [birth year], MRN [id number]  PCP:  Guadalupe Maple, MD  Cardiologist:  Sanda Klein, MD  Electrophysiologist:  None   Evaluation Performed:  Follow-Up Visit  Chief Complaint:  CHF, CAD, AFib  History of Present Illness:    Derek Hinton is a 74 y.o. male with CHF (EF 15-20%), CAD (severe 3-vessel cath 2019, medical mgmt), PAD (PTA R external iliac Oct 2019), s/p left BKA, long-term persistent atrial fibrillation, DM, COPD, history of ischemic CVA, squamous cell carcinoma of the skin of the anterior right thigh.  He was hospitalized in early October for 2 to 3 days due to severe hyperkalemia.  He was treated with Bactrim as an outpatient for scrotal abscess and was found to have fatigue due to bradycardia (atrial fibrillation with rates of 30-70 bpm).  He complained of some chest pressure at the time.  Subsequently labs showed a potassium of 8.6 (repeat 7.5).  His creatinine was 2.04.  He was treated with Lokelma and hydration and his potassium normalized and his creatinine decreased to 1.46.  He was not started on anticoagulants.  He has evidence of renal tubular acidosis type IV and was on Entresto.  Bactrim is now listed as an allergy.  He has recovered well and feels back to normal.  Activity is limited by his left leg amputation.  But he can move around his quarters without assistance.  He denies angina or dyspnea either at rest or with activity and does not have edema, orthopnea, PND, palpitations, dizziness or syncope.  His electrocardiogram continues to show atrial fibrillation with nonspecific ST-T changes.  His weight today is 140 pounds, very close to our previously established 'dry weight" of 138 pounds.     Past Medical History:  Diagnosis Date  . COPD (chronic obstructive pulmonary disease) (HCC)    not on home O2  . Diabetes mellitus without complication ([place])   . Hypertension   . PVD  (peripheral vascular disease) ([place])    s/p L BKA   Past Surgical History:  Procedure Laterality Date  . ABDOMINAL AORTOGRAM W/LOWER EXTREMITY N/A 07/18/2018   Procedure: ABDOMINAL AORTOGRAM W/LOWER EXTREMITY;  Surgeon: Angelia Mould, MD;  Location: [place];  Service: Cardiovascular;  Laterality: N/A;  . BELOW KNEE LEG AMPUTATION    . PERIPHERAL VASCULAR INTERVENTION Right 07/18/2018   Procedure: PERIPHERAL VASCULAR INTERVENTION;  Surgeon: Angelia Mould, MD;  Location: [place] [facility];  Service: Cardiovascular;  Laterality: Right;  . RIGHT/LEFT HEART CATH AND CORONARY ANGIOGRAPHY N/A 07/21/2018   Procedure: RIGHT/LEFT HEART CATH AND CORONARY ANGIOGRAPHY;  Surgeon: Jettie Booze, MD;  Location: [place] [facility];  Service: Cardiovascular;  Laterality: N/A;     Current Meds  Medication Sig  . aspirin 81 MG tablet Take 81 mg by mouth daily.  Marland Kitchen atorvastatin (LIPITOR) 40 MG tablet Take 1 tablet (40 mg total) by mouth daily.  Marland Kitchen BREO ELLIPTA 100-25 MCG/INH AEPB Inhale 2 puffs into the lungs as directed.  Marland Kitchen FARXIGA 10 MG TABS tablet   . furosemide (LASIX) 40 MG tablet Take 80mg  in the morning and 40mg  in the pm (Patient taking differently: 80 mg. Take 80mg  in the morning)  . glipiZIDE (GLUCOTROL XL) 5 MG 24 hr tablet Take 5 mg by mouth daily.  . iron polysaccharides (NIFEREX) 150 MG capsule Take 150 mg by mouth daily.  . metFORMIN (GLUCOPHAGE) 1000 MG tablet Take 500 mg  by mouth 2 (two) times daily.   . metoprolol succinate (TOPROL-XL) 50 MG 24 hr tablet Take 1 tablet (50 mg total) by mouth daily. Take with or immediately following a meal.  . Multiple Vitamin (MULTIVITAMIN WITH MINERALS) TABS tablet Take 1 tablet by mouth daily.  Marland Kitchen omeprazole (PRILOSEC) 20 MG capsule Take 1 capsule by mouth 2 (two) times daily before a meal.   . sacubitril-valsartan (ENTRESTO) 49-51 MG Take 1 tablet by mouth 2 (two) times daily.     Allergies:   Penicillins   Social  History   Tobacco Use  . Smoking status: Former Smoker    Packs/day: 1.00    Years: 60.00    Pack years: 60.00    Quit date: 12/2017    Years since quitting: 2.6  . Smokeless tobacco: Never Used  Substance Use Topics  . Alcohol use: Not Currently  . Drug use: Never     Family Hx: The patient's family history includes Diabetes in his father.  ROS:   Please see the history of present illness.    All other systems reviewed and are negative.   Prior CV studies:   The following studies were reviewed today: Labs/Other Tests and Data Reviewed:    EKG:  No ECG reviewed.  Recent Labs: No results found for requested labs within last 8760 hours.   Recent Lipid Panel No results found for: CHOL, TRIG, HDL, CHOLHDL, LDLCALC, LDLDIRECT    11/30/2019 labs Potassium 4.7, glucose 185, creatinine 1.75, normal liver function tests Hemoglobin 10.2  Wt Readings from Last 3 Encounters:  07/20/20 140 lb (63.5 kg)  12/16/19 138 lb (62.6 kg)  06/10/19 136 lb 6.4 oz (61.9 kg)     Objective:    Vital Signs:  BP (!) 151/65   Pulse 64   Ht 6' (1.829 m)   Wt 140 lb (63.5 kg)   SpO2 100%   BMI 18.99 kg/m     General: Alert, oriented x3, no distress, very lean Head: no evidence of trauma, PERRL, EOMI, no exophtalmos or lid lag, no myxedema, no xanthelasma; normal ears, nose and oropharynx Neck: normal jugular venous pulsations and no hepatojugular reflux; brisk carotid pulses without delay and no carotid bruits Chest: clear to auscultation, no signs of consolidation by percussion or palpation, normal fremitus, symmetrical and full respiratory excursions Cardiovascular: normal position and quality of the apical impulse, irregular rhythm, normal first and second heart sounds, no murmurs, rubs or gallops Abdomen: no tenderness or distention, no masses by palpation, no abnormal pulsatility or arterial bruits, normal bowel sounds, no hepatosplenomegaly Extremities: no clubbing, cyanosis or  edema of the L amputation stump or the right foot; 2+ radial, ulnar and brachial pulses bilaterally; s/p left BKA. Neurological: grossly nonfocal Psych: Normal mood and affect   ASSESSMENT & PLAN:    1. Chronic combined systolic and diastolic heart failure (HCC)   2. Persistent atrial fibrillation ([place])   3. Coronary artery disease involving native coronary artery of native heart without angina pectoris   4. PAD (peripheral artery disease) ([place])   5. Left below-knee amputee ([place])   6. Chronic obstructive pulmonary disease, unspecified COPD type ([place])   7. Essential hypertension   8. Type 2 diabetes mellitus with peripheral vascular disease ([place])     1. CHF:  Hard to assess functional status due to amputation, but he appears clinically euvolemic.  He is on Entresto, beta-blockers, loop diuretic and SGLT2 inhibitor.  I would not recommend using spironolactone due to his  renal tubular acidosis type IV and recent problems with hyperkalemia.  Weight is stable.  Marland Kitchen  No changes in medications. 2. AFib:  Unaware of the arrhythmia, well rate controlled. CHADSVasc 8 (age 50, DM, CAD, CHF, HTN, stroke on imaging studies).   Previously discussed the pros and cons of anticoagulant therapy.  He has thrombocytopenia and is clearly at risk for injury due to his amputation.  For the time being we will continue aspirin, switch to a direct oral anticoagulant if he should develop new neurological ischemic events. 3. CAD:  Sedentary.  No angina on current therapy. 4. PAD:  No claudication but very limited walking range. 5. L BKA:  Can walk for short distances with his prosthesis and a walker.  No recent falls. 6. COPD:  Not requiring oxygen supplementation. 7. HTN:  Slightly high today, but within desirable range from multiple recent assessments.  No changes made to his medications. 8. DM:  No A1c that I can retrieve for review. Get lipid profile and A1c from PCP.    Patient Instructions  Medication  Instructions:  Your physician recommends that you continue on your current medications as directed. Please refer to the Current Medication list given to you today.  *If you need a refill on your cardiac medications before your next appointment, please call your pharmacy*  Follow-Up: At [facility], you and your health needs are our priority.  As part of our continuing mission to provide you with exceptional heart care, we have created designated Provider Care Teams.  These Care Teams include your primary Cardiologist (physician) and Advanced Practice Providers (APPs -  Physician Assistants and Nurse Practitioners) who all work together to provide you with the care you need, when you need it.  We recommend signing up for the patient portal called "MyChart".  Sign up information is provided on this After Visit Summary.  MyChart is used to connect with patients for Virtual Visits (Telemedicine).  Patients are able to view lab/test results, encounter notes, upcoming appointments, etc.  Non-urgent messages can be sent to your provider as well.   To learn more about what you can do with MyChart, go to [email or web address].    Your next appointment:   12 month(s)  The format for your next appointment:   In Person  Provider:   Sanda Klein, MD     Signed, Sanda Klein, MD  07/21/2020 4:51 PM    [place]

## 2020-07-20 ENCOUNTER — Ambulatory Visit (INDEPENDENT_AMBULATORY_CARE_PROVIDER_SITE_OTHER): Payer: Medicare Other | Admitting: Cardiovascular Disease

## 2020-07-20 ENCOUNTER — Other Ambulatory Visit: Payer: Self-pay

## 2020-07-20 ENCOUNTER — Encounter: Payer: Self-pay | Admitting: Cardiovascular Disease

## 2020-07-20 VITALS — BP 151/65 | HR 64 | Ht 72.0 in | Wt 140.0 lb

## 2020-07-20 DIAGNOSIS — E1151 Type 2 diabetes mellitus with diabetic peripheral angiopathy without gangrene: Secondary | ICD-10-CM | POA: Diagnosis not present

## 2020-07-20 DIAGNOSIS — I4819 Other persistent atrial fibrillation: Secondary | ICD-10-CM

## 2020-07-20 DIAGNOSIS — I2510 Atherosclerotic heart disease of native coronary artery without angina pectoris: Secondary | ICD-10-CM | POA: Diagnosis not present

## 2020-07-20 DIAGNOSIS — Z89512 Acquired absence of left leg below knee: Secondary | ICD-10-CM

## 2020-07-20 DIAGNOSIS — I5042 Chronic combined systolic (congestive) and diastolic (congestive) heart failure: Secondary | ICD-10-CM | POA: Diagnosis not present

## 2020-07-20 DIAGNOSIS — I739 Peripheral vascular disease, unspecified: Secondary | ICD-10-CM | POA: Diagnosis not present

## 2020-07-20 DIAGNOSIS — I10 Essential (primary) hypertension: Secondary | ICD-10-CM

## 2020-07-20 DIAGNOSIS — J449 Chronic obstructive pulmonary disease, unspecified: Secondary | ICD-10-CM

## 2020-07-20 NOTE — Patient Instructions (Signed)
Medication Instructions:  Your physician recommends that you continue on your current medications as directed. Please refer to the Current Medication list given to you today.  *If you need a refill on your cardiac medications before your next appointment, please call your pharmacy*  Follow-Up: At [facility], you and your health needs are our priority.  As part of our continuing mission to provide you with exceptional heart care, we have created designated Provider Care Teams.  These Care Teams include your primary Cardiologist (physician) and Advanced Practice Providers (APPs -  Physician Assistants and Nurse Practitioners) who all work together to provide you with the care you need, when you need it.  We recommend signing up for the patient portal called "MyChart".  Sign up information is provided on this After Visit Summary.  MyChart is used to connect with patients for Virtual Visits (Telemedicine).  Patients are able to view lab/test results, encounter notes, upcoming appointments, etc.  Non-urgent messages can be sent to your provider as well.   To learn more about what you can do with MyChart, go to [email or web address].    Your next appointment:   12 month(s)  The format for your next appointment:   In Person  Provider:   Mihai Croitoru, MD     

## 2020-08-02 DIAGNOSIS — H524 Presbyopia: Secondary | ICD-10-CM | POA: Diagnosis not present

## 2020-08-02 DIAGNOSIS — H2511 Age-related nuclear cataract, right eye: Secondary | ICD-10-CM | POA: Diagnosis not present

## 2020-08-24 DIAGNOSIS — Z23 Encounter for immunization: Secondary | ICD-10-CM | POA: Diagnosis not present

## 2020-08-30 DIAGNOSIS — D649 Anemia, unspecified: Secondary | ICD-10-CM | POA: Diagnosis not present

## 2020-08-30 DIAGNOSIS — I10 Essential (primary) hypertension: Secondary | ICD-10-CM | POA: Diagnosis not present

## 2020-08-30 DIAGNOSIS — I110 Hypertensive heart disease with heart failure: Secondary | ICD-10-CM | POA: Diagnosis not present

## 2020-08-30 DIAGNOSIS — E119 Type 2 diabetes mellitus without complications: Secondary | ICD-10-CM | POA: Diagnosis not present

## 2020-09-02 DIAGNOSIS — I129 Hypertensive chronic kidney disease with stage 1 through stage 4 chronic kidney disease, or unspecified chronic kidney disease: Secondary | ICD-10-CM | POA: Diagnosis not present

## 2020-09-02 DIAGNOSIS — E1122 Type 2 diabetes mellitus with diabetic chronic kidney disease: Secondary | ICD-10-CM | POA: Diagnosis not present

## 2020-09-02 DIAGNOSIS — N183 Chronic kidney disease, stage 3 unspecified: Secondary | ICD-10-CM | POA: Diagnosis not present

## 2020-09-02 DIAGNOSIS — R079 Chest pain, unspecified: Secondary | ICD-10-CM | POA: Diagnosis not present

## 2020-09-02 DIAGNOSIS — E875 Hyperkalemia: Secondary | ICD-10-CM | POA: Diagnosis not present

## 2020-09-07 DIAGNOSIS — R8290 Unspecified abnormal findings in urine: Secondary | ICD-10-CM | POA: Diagnosis not present

## 2020-09-07 DIAGNOSIS — R8271 Bacteriuria: Secondary | ICD-10-CM | POA: Diagnosis not present

## 2020-09-13 DIAGNOSIS — I129 Hypertensive chronic kidney disease with stage 1 through stage 4 chronic kidney disease, or unspecified chronic kidney disease: Secondary | ICD-10-CM | POA: Diagnosis not present

## 2020-09-13 DIAGNOSIS — E1122 Type 2 diabetes mellitus with diabetic chronic kidney disease: Secondary | ICD-10-CM | POA: Diagnosis not present

## 2020-09-13 DIAGNOSIS — E875 Hyperkalemia: Secondary | ICD-10-CM | POA: Diagnosis not present

## 2020-09-13 DIAGNOSIS — I509 Heart failure, unspecified: Secondary | ICD-10-CM | POA: Diagnosis not present

## 2020-09-13 DIAGNOSIS — N183 Chronic kidney disease, stage 3 unspecified: Secondary | ICD-10-CM | POA: Diagnosis not present

## 2020-09-13 DIAGNOSIS — C439 Malignant melanoma of skin, unspecified: Secondary | ICD-10-CM | POA: Diagnosis not present

## 2020-09-13 DIAGNOSIS — J449 Chronic obstructive pulmonary disease, unspecified: Secondary | ICD-10-CM | POA: Diagnosis not present

## 2020-09-13 DIAGNOSIS — E118 Type 2 diabetes mellitus with unspecified complications: Secondary | ICD-10-CM | POA: Diagnosis not present

## 2020-09-13 DIAGNOSIS — R079 Chest pain, unspecified: Secondary | ICD-10-CM | POA: Diagnosis not present

## 2020-09-13 DIAGNOSIS — N454 Abscess of epididymis or testis: Secondary | ICD-10-CM | POA: Diagnosis not present

## 2020-09-13 DIAGNOSIS — D61818 Other pancytopenia: Secondary | ICD-10-CM | POA: Diagnosis not present

## 2020-09-13 DIAGNOSIS — Z0000 Encounter for general adult medical examination without abnormal findings: Secondary | ICD-10-CM | POA: Diagnosis not present

## 2020-10-10 DIAGNOSIS — E1122 Type 2 diabetes mellitus with diabetic chronic kidney disease: Secondary | ICD-10-CM | POA: Diagnosis not present

## 2020-10-10 DIAGNOSIS — I509 Heart failure, unspecified: Secondary | ICD-10-CM | POA: Diagnosis not present

## 2020-10-10 DIAGNOSIS — E118 Type 2 diabetes mellitus with unspecified complications: Secondary | ICD-10-CM | POA: Diagnosis not present

## 2020-10-10 DIAGNOSIS — E875 Hyperkalemia: Secondary | ICD-10-CM | POA: Diagnosis not present

## 2020-10-10 DIAGNOSIS — I129 Hypertensive chronic kidney disease with stage 1 through stage 4 chronic kidney disease, or unspecified chronic kidney disease: Secondary | ICD-10-CM | POA: Diagnosis not present

## 2020-10-10 DIAGNOSIS — J449 Chronic obstructive pulmonary disease, unspecified: Secondary | ICD-10-CM | POA: Diagnosis not present

## 2020-10-10 DIAGNOSIS — N454 Abscess of epididymis or testis: Secondary | ICD-10-CM | POA: Diagnosis not present

## 2020-10-10 DIAGNOSIS — C439 Malignant melanoma of skin, unspecified: Secondary | ICD-10-CM | POA: Diagnosis not present

## 2020-10-10 DIAGNOSIS — D61818 Other pancytopenia: Secondary | ICD-10-CM | POA: Diagnosis not present

## 2020-10-10 DIAGNOSIS — N183 Chronic kidney disease, stage 3 unspecified: Secondary | ICD-10-CM | POA: Diagnosis not present

## 2020-10-10 DIAGNOSIS — R079 Chest pain, unspecified: Secondary | ICD-10-CM | POA: Diagnosis not present

## 2020-10-13 DIAGNOSIS — R2681 Unsteadiness on feet: Secondary | ICD-10-CM | POA: Diagnosis not present

## 2020-10-13 DIAGNOSIS — M6281 Muscle weakness (generalized): Secondary | ICD-10-CM | POA: Diagnosis not present

## 2020-10-14 DIAGNOSIS — R2681 Unsteadiness on feet: Secondary | ICD-10-CM | POA: Diagnosis not present

## 2020-10-14 DIAGNOSIS — M6281 Muscle weakness (generalized): Secondary | ICD-10-CM | POA: Diagnosis not present

## 2020-10-15 DIAGNOSIS — M6281 Muscle weakness (generalized): Secondary | ICD-10-CM | POA: Diagnosis not present

## 2020-10-15 DIAGNOSIS — R2681 Unsteadiness on feet: Secondary | ICD-10-CM | POA: Diagnosis not present

## 2020-10-16 DIAGNOSIS — M6281 Muscle weakness (generalized): Secondary | ICD-10-CM | POA: Diagnosis not present

## 2020-10-16 DIAGNOSIS — R2681 Unsteadiness on feet: Secondary | ICD-10-CM | POA: Diagnosis not present

## 2020-10-16 DIAGNOSIS — Z20828 Contact with and (suspected) exposure to other viral communicable diseases: Secondary | ICD-10-CM | POA: Diagnosis not present

## 2020-10-17 DIAGNOSIS — R2681 Unsteadiness on feet: Secondary | ICD-10-CM | POA: Diagnosis not present

## 2020-10-17 DIAGNOSIS — M6281 Muscle weakness (generalized): Secondary | ICD-10-CM | POA: Diagnosis not present

## 2020-11-18 DIAGNOSIS — B351 Tinea unguium: Secondary | ICD-10-CM | POA: Diagnosis not present

## 2020-11-18 DIAGNOSIS — E1159 Type 2 diabetes mellitus with other circulatory complications: Secondary | ICD-10-CM | POA: Diagnosis not present

## 2020-11-30 DIAGNOSIS — E119 Type 2 diabetes mellitus without complications: Secondary | ICD-10-CM | POA: Diagnosis not present

## 2020-11-30 DIAGNOSIS — I10 Essential (primary) hypertension: Secondary | ICD-10-CM | POA: Diagnosis not present

## 2020-11-30 DIAGNOSIS — D649 Anemia, unspecified: Secondary | ICD-10-CM | POA: Diagnosis not present

## 2020-12-10 DIAGNOSIS — N454 Abscess of epididymis or testis: Secondary | ICD-10-CM | POA: Diagnosis not present

## 2020-12-10 DIAGNOSIS — D61818 Other pancytopenia: Secondary | ICD-10-CM | POA: Diagnosis not present

## 2020-12-10 DIAGNOSIS — I509 Heart failure, unspecified: Secondary | ICD-10-CM | POA: Diagnosis not present

## 2020-12-10 DIAGNOSIS — N183 Chronic kidney disease, stage 3 unspecified: Secondary | ICD-10-CM | POA: Diagnosis not present

## 2020-12-10 DIAGNOSIS — E118 Type 2 diabetes mellitus with unspecified complications: Secondary | ICD-10-CM | POA: Diagnosis not present

## 2020-12-10 DIAGNOSIS — I129 Hypertensive chronic kidney disease with stage 1 through stage 4 chronic kidney disease, or unspecified chronic kidney disease: Secondary | ICD-10-CM | POA: Diagnosis not present

## 2020-12-10 DIAGNOSIS — E875 Hyperkalemia: Secondary | ICD-10-CM | POA: Diagnosis not present

## 2020-12-10 DIAGNOSIS — R079 Chest pain, unspecified: Secondary | ICD-10-CM | POA: Diagnosis not present

## 2020-12-10 DIAGNOSIS — E1122 Type 2 diabetes mellitus with diabetic chronic kidney disease: Secondary | ICD-10-CM | POA: Diagnosis not present

## 2020-12-10 DIAGNOSIS — C439 Malignant melanoma of skin, unspecified: Secondary | ICD-10-CM | POA: Diagnosis not present

## 2020-12-10 DIAGNOSIS — J449 Chronic obstructive pulmonary disease, unspecified: Secondary | ICD-10-CM | POA: Diagnosis not present

## 2021-01-03 DIAGNOSIS — H353122 Nonexudative age-related macular degeneration, left eye, intermediate dry stage: Secondary | ICD-10-CM | POA: Diagnosis not present

## 2021-01-30 DIAGNOSIS — E875 Hyperkalemia: Secondary | ICD-10-CM | POA: Diagnosis not present

## 2021-01-30 DIAGNOSIS — I509 Heart failure, unspecified: Secondary | ICD-10-CM | POA: Diagnosis not present

## 2021-01-30 DIAGNOSIS — N183 Chronic kidney disease, stage 3 unspecified: Secondary | ICD-10-CM | POA: Diagnosis not present

## 2021-01-30 DIAGNOSIS — R079 Chest pain, unspecified: Secondary | ICD-10-CM | POA: Diagnosis not present

## 2021-01-30 DIAGNOSIS — C439 Malignant melanoma of skin, unspecified: Secondary | ICD-10-CM | POA: Diagnosis not present

## 2021-01-30 DIAGNOSIS — E1122 Type 2 diabetes mellitus with diabetic chronic kidney disease: Secondary | ICD-10-CM | POA: Diagnosis not present

## 2021-01-30 DIAGNOSIS — D61818 Other pancytopenia: Secondary | ICD-10-CM | POA: Diagnosis not present

## 2021-01-30 DIAGNOSIS — J449 Chronic obstructive pulmonary disease, unspecified: Secondary | ICD-10-CM | POA: Diagnosis not present

## 2021-01-30 DIAGNOSIS — I129 Hypertensive chronic kidney disease with stage 1 through stage 4 chronic kidney disease, or unspecified chronic kidney disease: Secondary | ICD-10-CM | POA: Diagnosis not present

## 2021-01-30 DIAGNOSIS — E118 Type 2 diabetes mellitus with unspecified complications: Secondary | ICD-10-CM | POA: Diagnosis not present

## 2021-01-30 DIAGNOSIS — N454 Abscess of epididymis or testis: Secondary | ICD-10-CM | POA: Diagnosis not present

## 2021-02-25 DIAGNOSIS — N183 Chronic kidney disease, stage 3 unspecified: Secondary | ICD-10-CM | POA: Diagnosis not present

## 2021-02-25 DIAGNOSIS — E1122 Type 2 diabetes mellitus with diabetic chronic kidney disease: Secondary | ICD-10-CM | POA: Diagnosis not present

## 2021-02-25 DIAGNOSIS — I509 Heart failure, unspecified: Secondary | ICD-10-CM | POA: Diagnosis not present

## 2021-02-25 DIAGNOSIS — J449 Chronic obstructive pulmonary disease, unspecified: Secondary | ICD-10-CM | POA: Diagnosis not present

## 2021-02-25 DIAGNOSIS — E875 Hyperkalemia: Secondary | ICD-10-CM | POA: Diagnosis not present

## 2021-02-25 DIAGNOSIS — I129 Hypertensive chronic kidney disease with stage 1 through stage 4 chronic kidney disease, or unspecified chronic kidney disease: Secondary | ICD-10-CM | POA: Diagnosis not present

## 2021-02-25 DIAGNOSIS — D61818 Other pancytopenia: Secondary | ICD-10-CM | POA: Diagnosis not present

## 2021-02-25 DIAGNOSIS — C439 Malignant melanoma of skin, unspecified: Secondary | ICD-10-CM | POA: Diagnosis not present

## 2021-02-25 DIAGNOSIS — E118 Type 2 diabetes mellitus with unspecified complications: Secondary | ICD-10-CM | POA: Diagnosis not present

## 2021-02-27 DIAGNOSIS — I10 Essential (primary) hypertension: Secondary | ICD-10-CM | POA: Diagnosis not present

## 2021-02-27 DIAGNOSIS — E119 Type 2 diabetes mellitus without complications: Secondary | ICD-10-CM | POA: Diagnosis not present

## 2021-02-27 DIAGNOSIS — D649 Anemia, unspecified: Secondary | ICD-10-CM | POA: Diagnosis not present

## 2021-03-01 DIAGNOSIS — R2681 Unsteadiness on feet: Secondary | ICD-10-CM | POA: Diagnosis not present

## 2021-03-02 DIAGNOSIS — R2681 Unsteadiness on feet: Secondary | ICD-10-CM | POA: Diagnosis not present

## 2021-03-03 DIAGNOSIS — R2681 Unsteadiness on feet: Secondary | ICD-10-CM | POA: Diagnosis not present

## 2021-03-05 ENCOUNTER — Encounter (HOSPITAL_COMMUNITY): Payer: Self-pay | Admitting: Emergency Medicine

## 2021-03-05 ENCOUNTER — Emergency Department (HOSPITAL_COMMUNITY): Payer: Medicare Other

## 2021-03-05 ENCOUNTER — Inpatient Hospital Stay (HOSPITAL_COMMUNITY): Payer: Medicare Other

## 2021-03-05 ENCOUNTER — Inpatient Hospital Stay (HOSPITAL_COMMUNITY)
Admission: EM | Admit: 2021-03-05 | Discharge: 2021-03-09 | DRG: 228 | Disposition: A | Discharge status: Skilled Nursing Facility | Payer: Medicare Other | Attending: Cardiovascular Disease | Admitting: Cardiovascular Disease

## 2021-03-05 DIAGNOSIS — G9341 Metabolic encephalopathy: Secondary | ICD-10-CM | POA: Diagnosis present

## 2021-03-05 DIAGNOSIS — I4819 Other persistent atrial fibrillation: Secondary | ICD-10-CM | POA: Diagnosis present

## 2021-03-05 DIAGNOSIS — I130 Hypertensive heart and chronic kidney disease with heart failure and stage 1 through stage 4 chronic kidney disease, or unspecified chronic kidney disease: Secondary | ICD-10-CM | POA: Diagnosis present

## 2021-03-05 DIAGNOSIS — I472 Ventricular tachycardia: Secondary | ICD-10-CM | POA: Diagnosis not present

## 2021-03-05 DIAGNOSIS — C439 Malignant melanoma of skin, unspecified: Secondary | ICD-10-CM | POA: Diagnosis not present

## 2021-03-05 DIAGNOSIS — N183 Chronic kidney disease, stage 3 unspecified: Secondary | ICD-10-CM | POA: Diagnosis present

## 2021-03-05 DIAGNOSIS — I517 Cardiomegaly: Secondary | ICD-10-CM | POA: Diagnosis not present

## 2021-03-05 DIAGNOSIS — Z006 Encounter for examination for normal comparison and control in clinical research program: Secondary | ICD-10-CM | POA: Diagnosis not present

## 2021-03-05 DIAGNOSIS — I469 Cardiac arrest, cause unspecified: Secondary | ICD-10-CM | POA: Diagnosis present

## 2021-03-05 DIAGNOSIS — Z7982 Long term (current) use of aspirin: Secondary | ICD-10-CM | POA: Diagnosis not present

## 2021-03-05 DIAGNOSIS — I739 Peripheral vascular disease, unspecified: Secondary | ICD-10-CM | POA: Diagnosis not present

## 2021-03-05 DIAGNOSIS — Z7984 Long term (current) use of oral hypoglycemic drugs: Secondary | ICD-10-CM

## 2021-03-05 DIAGNOSIS — R092 Respiratory arrest: Secondary | ICD-10-CM | POA: Diagnosis not present

## 2021-03-05 DIAGNOSIS — E875 Hyperkalemia: Secondary | ICD-10-CM | POA: Diagnosis not present

## 2021-03-05 DIAGNOSIS — I5042 Chronic combined systolic (congestive) and diastolic (congestive) heart failure: Secondary | ICD-10-CM | POA: Diagnosis present

## 2021-03-05 DIAGNOSIS — R570 Cardiogenic shock: Secondary | ICD-10-CM | POA: Diagnosis present

## 2021-03-05 DIAGNOSIS — R0603 Acute respiratory distress: Secondary | ICD-10-CM | POA: Diagnosis not present

## 2021-03-05 DIAGNOSIS — R531 Weakness: Secondary | ICD-10-CM | POA: Diagnosis not present

## 2021-03-05 DIAGNOSIS — Z79899 Other long term (current) drug therapy: Secondary | ICD-10-CM | POA: Diagnosis not present

## 2021-03-05 DIAGNOSIS — Z95820 Peripheral vascular angioplasty status with implants and grafts: Secondary | ICD-10-CM | POA: Diagnosis not present

## 2021-03-05 DIAGNOSIS — E1151 Type 2 diabetes mellitus with diabetic peripheral angiopathy without gangrene: Secondary | ICD-10-CM | POA: Diagnosis present

## 2021-03-05 DIAGNOSIS — N179 Acute kidney failure, unspecified: Secondary | ICD-10-CM | POA: Diagnosis present

## 2021-03-05 DIAGNOSIS — I252 Old myocardial infarction: Secondary | ICD-10-CM

## 2021-03-05 DIAGNOSIS — Z66 Do not resuscitate: Secondary | ICD-10-CM | POA: Diagnosis not present

## 2021-03-05 DIAGNOSIS — I4891 Unspecified atrial fibrillation: Secondary | ICD-10-CM

## 2021-03-05 DIAGNOSIS — R0789 Other chest pain: Secondary | ICD-10-CM | POA: Diagnosis not present

## 2021-03-05 DIAGNOSIS — I25118 Atherosclerotic heart disease of native coronary artery with other forms of angina pectoris: Secondary | ICD-10-CM | POA: Diagnosis not present

## 2021-03-05 DIAGNOSIS — E118 Type 2 diabetes mellitus with unspecified complications: Secondary | ICD-10-CM | POA: Diagnosis not present

## 2021-03-05 DIAGNOSIS — Z7951 Long term (current) use of inhaled steroids: Secondary | ICD-10-CM | POA: Diagnosis not present

## 2021-03-05 DIAGNOSIS — Z48812 Encounter for surgical aftercare following surgery on the circulatory system: Secondary | ICD-10-CM | POA: Diagnosis not present

## 2021-03-05 DIAGNOSIS — Z20822 Contact with and (suspected) exposure to covid-19: Secondary | ICD-10-CM | POA: Diagnosis present

## 2021-03-05 DIAGNOSIS — I509 Heart failure, unspecified: Secondary | ICD-10-CM | POA: Diagnosis not present

## 2021-03-05 DIAGNOSIS — R42 Dizziness and giddiness: Secondary | ICD-10-CM | POA: Diagnosis not present

## 2021-03-05 DIAGNOSIS — I442 Atrioventricular block, complete: Principal | ICD-10-CM

## 2021-03-05 DIAGNOSIS — Z8673 Personal history of transient ischemic attack (TIA), and cerebral infarction without residual deficits: Secondary | ICD-10-CM

## 2021-03-05 DIAGNOSIS — J9601 Acute respiratory failure with hypoxia: Secondary | ICD-10-CM | POA: Diagnosis present

## 2021-03-05 DIAGNOSIS — R55 Syncope and collapse: Secondary | ICD-10-CM | POA: Diagnosis not present

## 2021-03-05 DIAGNOSIS — I255 Ischemic cardiomyopathy: Secondary | ICD-10-CM | POA: Diagnosis not present

## 2021-03-05 DIAGNOSIS — I129 Hypertensive chronic kidney disease with stage 1 through stage 4 chronic kidney disease, or unspecified chronic kidney disease: Secondary | ICD-10-CM | POA: Diagnosis not present

## 2021-03-05 DIAGNOSIS — Z833 Family history of diabetes mellitus: Secondary | ICD-10-CM

## 2021-03-05 DIAGNOSIS — R001 Bradycardia, unspecified: Secondary | ICD-10-CM | POA: Diagnosis present

## 2021-03-05 DIAGNOSIS — Z880 Allergy status to penicillin: Secondary | ICD-10-CM

## 2021-03-05 DIAGNOSIS — I5022 Chronic systolic (congestive) heart failure: Secondary | ICD-10-CM | POA: Diagnosis not present

## 2021-03-05 DIAGNOSIS — J9600 Acute respiratory failure, unspecified whether with hypoxia or hypercapnia: Secondary | ICD-10-CM | POA: Diagnosis not present

## 2021-03-05 DIAGNOSIS — Z515 Encounter for palliative care: Secondary | ICD-10-CM | POA: Diagnosis not present

## 2021-03-05 DIAGNOSIS — D696 Thrombocytopenia, unspecified: Secondary | ICD-10-CM | POA: Diagnosis present

## 2021-03-05 DIAGNOSIS — I272 Pulmonary hypertension, unspecified: Secondary | ICD-10-CM | POA: Diagnosis present

## 2021-03-05 DIAGNOSIS — E1122 Type 2 diabetes mellitus with diabetic chronic kidney disease: Secondary | ICD-10-CM | POA: Diagnosis present

## 2021-03-05 DIAGNOSIS — R54 Age-related physical debility: Secondary | ICD-10-CM | POA: Diagnosis present

## 2021-03-05 DIAGNOSIS — S2231XA Fracture of one rib, right side, initial encounter for closed fracture: Secondary | ICD-10-CM | POA: Diagnosis not present

## 2021-03-05 DIAGNOSIS — J449 Chronic obstructive pulmonary disease, unspecified: Secondary | ICD-10-CM | POA: Diagnosis present

## 2021-03-05 DIAGNOSIS — I700 Atherosclerosis of aorta: Secondary | ICD-10-CM | POA: Diagnosis not present

## 2021-03-05 DIAGNOSIS — R627 Adult failure to thrive: Secondary | ICD-10-CM | POA: Diagnosis not present

## 2021-03-05 DIAGNOSIS — Z85828 Personal history of other malignant neoplasm of skin: Secondary | ICD-10-CM

## 2021-03-05 DIAGNOSIS — I499 Cardiac arrhythmia, unspecified: Secondary | ICD-10-CM | POA: Diagnosis not present

## 2021-03-05 DIAGNOSIS — I361 Nonrheumatic tricuspid (valve) insufficiency: Secondary | ICD-10-CM | POA: Diagnosis not present

## 2021-03-05 DIAGNOSIS — Z87891 Personal history of nicotine dependence: Secondary | ICD-10-CM

## 2021-03-05 DIAGNOSIS — I2510 Atherosclerotic heart disease of native coronary artery without angina pectoris: Secondary | ICD-10-CM | POA: Diagnosis present

## 2021-03-05 DIAGNOSIS — F419 Anxiety disorder, unspecified: Secondary | ICD-10-CM | POA: Diagnosis not present

## 2021-03-05 DIAGNOSIS — J90 Pleural effusion, not elsewhere classified: Secondary | ICD-10-CM | POA: Diagnosis not present

## 2021-03-05 DIAGNOSIS — R079 Chest pain, unspecified: Secondary | ICD-10-CM | POA: Diagnosis not present

## 2021-03-05 DIAGNOSIS — D61818 Other pancytopenia: Secondary | ICD-10-CM | POA: Diagnosis not present

## 2021-03-05 DIAGNOSIS — Z959 Presence of cardiac and vascular implant and graft, unspecified: Secondary | ICD-10-CM

## 2021-03-05 DIAGNOSIS — E1165 Type 2 diabetes mellitus with hyperglycemia: Secondary | ICD-10-CM | POA: Diagnosis present

## 2021-03-05 DIAGNOSIS — Z89512 Acquired absence of left leg below knee: Secondary | ICD-10-CM

## 2021-03-05 DIAGNOSIS — Z950 Presence of cardiac pacemaker: Secondary | ICD-10-CM | POA: Diagnosis not present

## 2021-03-05 DIAGNOSIS — R918 Other nonspecific abnormal finding of lung field: Secondary | ICD-10-CM | POA: Diagnosis not present

## 2021-03-05 LAB — MRSA PCR SCREENING: MRSA by PCR: NEGATIVE

## 2021-03-05 LAB — COMPREHENSIVE METABOLIC PANEL
ALT: 56 U/L — ABNORMAL HIGH (ref 0–44)
AST: 77 U/L — ABNORMAL HIGH (ref 15–41)
Albumin: 3.6 g/dL (ref 3.5–5.0)
Alkaline Phosphatase: 93 U/L (ref 38–126)
Anion gap: 13 (ref 5–15)
BUN: 54 mg/dL — ABNORMAL HIGH (ref 8–23)
CO2: 22 mmol/L (ref 22–32)
Calcium: 8.4 mg/dL — ABNORMAL LOW (ref 8.9–10.3)
Chloride: 103 mmol/L (ref 98–111)
Creatinine, Ser: 1.7 mg/dL — ABNORMAL HIGH (ref 0.61–1.24)
GFR, Estimated: 41 mL/min — ABNORMAL LOW (ref >60)
Glucose, Bld: 366 mg/dL — ABNORMAL HIGH (ref 70–99)
Potassium: 5 mmol/L (ref 3.5–5.1)
Sodium: 138 mmol/L (ref 135–145)
Total Bilirubin: 1 mg/dL (ref 0.3–1.2)
Total Protein: 6.7 g/dL (ref 6.5–8.1)

## 2021-03-05 LAB — GLUCOSE, CAPILLARY
Glucose-Capillary: 115 mg/dL — ABNORMAL HIGH (ref 70–99)
Glucose-Capillary: 303 mg/dL — ABNORMAL HIGH (ref 70–99)
Glucose-Capillary: 315 mg/dL — ABNORMAL HIGH (ref 70–99)
Glucose-Capillary: 386 mg/dL — ABNORMAL HIGH (ref 70–99)

## 2021-03-05 LAB — CBC WITH DIFFERENTIAL/PLATELET
Abs Immature Granulocytes: 0.06 10*3/uL (ref 0.00–0.07)
Basophils Absolute: 0 10*3/uL (ref 0.0–0.1)
Basophils Relative: 0 %
Eosinophils Absolute: 0 10*3/uL (ref 0.0–0.5)
Eosinophils Relative: 0 %
HCT: 34.6 % — ABNORMAL LOW (ref 39.0–52.0)
Hemoglobin: 10.7 g/dL — ABNORMAL LOW (ref 13.0–17.0)
Immature Granulocytes: 1 %
Lymphocytes Relative: 12 %
Lymphs Abs: 0.9 10*3/uL (ref 0.7–4.0)
MCH: 31.4 pg (ref 26.0–34.0)
MCHC: 30.9 g/dL (ref 30.0–36.0)
MCV: 101.5 fL — ABNORMAL HIGH (ref 80.0–100.0)
Monocytes Absolute: 0.3 10*3/uL (ref 0.1–1.0)
Monocytes Relative: 4 %
Neutro Abs: 6.3 10*3/uL (ref 1.7–7.7)
Neutrophils Relative %: 83 %
Platelets: 80 10*3/uL — ABNORMAL LOW (ref 150–400)
RBC: 3.41 MIL/uL — ABNORMAL LOW (ref 4.22–5.81)
RDW: 15 % (ref 11.5–15.5)
WBC: 7.5 10*3/uL (ref 4.0–10.5)
nRBC: 0 % (ref 0.0–0.2)

## 2021-03-05 LAB — I-STAT ARTERIAL BLOOD GAS, ED
Acid-Base Excess: 0 mmol/L (ref 0.0–2.0)
Bicarbonate: 25.8 mmol/L (ref 20.0–28.0)
Calcium, Ion: 1.14 mmol/L — ABNORMAL LOW (ref 1.15–1.40)
HCT: 31 % — ABNORMAL LOW (ref 39.0–52.0)
Hemoglobin: 10.5 g/dL — ABNORMAL LOW (ref 13.0–17.0)
O2 Saturation: 100 %
Patient temperature: 98.6
Potassium: 4.7 mmol/L (ref 3.5–5.1)
Sodium: 140 mmol/L (ref 135–145)
TCO2: 27 mmol/L (ref 22–32)
pCO2 arterial: 43.9 mmHg (ref 32.0–48.0)
pH, Arterial: 7.377 (ref 7.350–7.450)
pO2, Arterial: 452 mmHg — ABNORMAL HIGH (ref 83.0–108.0)

## 2021-03-05 LAB — RESP PANEL BY RT-PCR (FLU A&B, COVID) ARPGX2
Influenza A by PCR: NEGATIVE
Influenza B by PCR: NEGATIVE
SARS Coronavirus 2 by RT PCR: NEGATIVE

## 2021-03-05 LAB — I-STAT CHEM 8, ED
BUN: 56 mg/dL — ABNORMAL HIGH (ref 8–23)
Calcium, Ion: 1.04 mmol/L — ABNORMAL LOW (ref 1.15–1.40)
Chloride: 105 mmol/L (ref 98–111)
Creatinine, Ser: 1.6 mg/dL — ABNORMAL HIGH (ref 0.61–1.24)
Glucose, Bld: 353 mg/dL — ABNORMAL HIGH (ref 70–99)
HCT: 31 % — ABNORMAL LOW (ref 39.0–52.0)
Hemoglobin: 10.5 g/dL — ABNORMAL LOW (ref 13.0–17.0)
Potassium: 5 mmol/L (ref 3.5–5.1)
Sodium: 140 mmol/L (ref 135–145)
TCO2: 24 mmol/L (ref 22–32)

## 2021-03-05 LAB — MAGNESIUM: Magnesium: 2 mg/dL (ref 1.7–2.4)

## 2021-03-05 LAB — ECHOCARDIOGRAM COMPLETE
Area-P 1/2: 5.97 cm2
Height: 67 in
S' Lateral: 4.5 cm
Weight: 2240 oz

## 2021-03-05 LAB — TROPONIN I (HIGH SENSITIVITY)
Troponin I (High Sensitivity): 46 ng/L — ABNORMAL HIGH (ref <18)
Troponin I (High Sensitivity): 79 ng/L — ABNORMAL HIGH (ref <18)

## 2021-03-05 LAB — PROTIME-INR
INR: 1.1 (ref 0.8–1.2)
Prothrombin Time: 14 seconds (ref 11.4–15.2)

## 2021-03-05 LAB — BRAIN NATRIURETIC PEPTIDE: B Natriuretic Peptide: 946 pg/mL — ABNORMAL HIGH (ref 0.0–100.0)

## 2021-03-05 MED ORDER — POLYETHYLENE GLYCOL 3350 17 G PO PACK
17.0000 g | PACK | Freq: Every day | ORAL | Status: DC | PRN
  Administered 2021-03-07: 17 g via ORAL
  Filled 2021-03-05: qty 1

## 2021-03-05 MED ORDER — HEPARIN SODIUM (PORCINE) 5000 UNIT/ML IJ SOLN
5000.0000 [IU] | Freq: Three times a day (TID) | INTRAMUSCULAR | Status: AC
  Administered 2021-03-05 – 2021-03-07 (×7): 5000 [IU] via SUBCUTANEOUS
  Filled 2021-03-05 (×7): qty 1

## 2021-03-05 MED ORDER — NOREPINEPHRINE 4 MG/250ML-% IV SOLN
0.0000 ug/min | INTRAVENOUS | Status: DC
  Filled 2021-03-05: qty 250

## 2021-03-05 MED ORDER — ATORVASTATIN CALCIUM 40 MG PO TABS
40.0000 mg | ORAL_TABLET | Freq: Every day | ORAL | Status: DC
  Administered 2021-03-05: 40 mg
  Filled 2021-03-05: qty 1

## 2021-03-05 MED ORDER — ROCURONIUM BROMIDE 10 MG/ML (PF) SYRINGE
PREFILLED_SYRINGE | INTRAVENOUS | Status: AC
  Administered 2021-03-05: 80 mg via INTRAVENOUS
  Filled 2021-03-05: qty 10

## 2021-03-05 MED ORDER — INSULIN ASPART 100 UNIT/ML IJ SOLN
0.0000 [IU] | INTRAMUSCULAR | Status: DC
  Administered 2021-03-05: 20 [IU] via SUBCUTANEOUS
  Administered 2021-03-05: 15 [IU] via SUBCUTANEOUS
  Administered 2021-03-06: 11 [IU] via SUBCUTANEOUS
  Administered 2021-03-06 (×2): 7 [IU] via SUBCUTANEOUS
  Administered 2021-03-06: 4 [IU] via SUBCUTANEOUS
  Administered 2021-03-07: 15 [IU] via SUBCUTANEOUS
  Administered 2021-03-07: 11 [IU] via SUBCUTANEOUS
  Administered 2021-03-07: 4 [IU] via SUBCUTANEOUS
  Administered 2021-03-08: 3 [IU] via SUBCUTANEOUS

## 2021-03-05 MED ORDER — ASPIRIN 81 MG PO CHEW
81.0000 mg | CHEWABLE_TABLET | Freq: Every day | ORAL | Status: DC
  Administered 2021-03-05: 81 mg
  Filled 2021-03-05: qty 1

## 2021-03-05 MED ORDER — IPRATROPIUM-ALBUTEROL 0.5-2.5 (3) MG/3ML IN SOLN
3.0000 mL | RESPIRATORY_TRACT | Status: DC
  Administered 2021-03-05 (×2): 3 mL via RESPIRATORY_TRACT
  Filled 2021-03-05 (×3): qty 3

## 2021-03-05 MED ORDER — ROCURONIUM BROMIDE 50 MG/5ML IV SOLN: 80.0000 mg | Freq: Once | INTRAVENOUS | Status: AC

## 2021-03-05 MED ORDER — DOPAMINE-DEXTROSE 3.2-5 MG/ML-% IV SOLN
INTRAVENOUS | Status: AC
  Administered 2021-03-05: 5 ug/kg/min via INTRAVENOUS
  Filled 2021-03-05: qty 250

## 2021-03-05 MED ORDER — SODIUM CHLORIDE 0.9 % IV SOLN
250.0000 mL | INTRAVENOUS | Status: DC
  Administered 2021-03-05: 250 mL via INTRAVENOUS

## 2021-03-05 MED ORDER — FENTANYL BOLUS VIA INFUSION
25.0000 ug | INTRAVENOUS | Status: DC | PRN
  Filled 2021-03-05: qty 100

## 2021-03-05 MED ORDER — FAMOTIDINE 40 MG/5ML PO SUSR: 20.0000 mg | Freq: Two times a day (BID) | ORAL | Status: DC

## 2021-03-05 MED ORDER — SUCCINYLCHOLINE CHLORIDE 200 MG/10ML IV SOSY
PREFILLED_SYRINGE | INTRAVENOUS | Status: AC
  Filled 2021-03-05: qty 10

## 2021-03-05 MED ORDER — PANTOPRAZOLE SODIUM 40 MG PO PACK: 40.0000 mg | PACK | Freq: Every day | ORAL | Status: DC

## 2021-03-05 MED ORDER — NOREPINEPHRINE 4 MG/250ML-% IV SOLN
2.0000 ug/min | INTRAVENOUS | Status: DC
  Administered 2021-03-05: 2 ug/min via INTRAVENOUS

## 2021-03-05 MED ORDER — FENTANYL 2500MCG IN NS 250ML (10MCG/ML) PREMIX INFUSION: 25.0000 ug/h | INTRAVENOUS | Status: DC

## 2021-03-05 MED ORDER — FENTANYL CITRATE (PF) 100 MCG/2ML IJ SOLN: 25.0000 ug | Freq: Once | INTRAMUSCULAR | Status: DC

## 2021-03-05 MED ORDER — LACTATED RINGERS IV SOLN: INTRAVENOUS | Status: DC

## 2021-03-05 MED ORDER — ETOMIDATE 2 MG/ML IV SOLN
INTRAVENOUS | Status: AC
  Administered 2021-03-05: 20 mg via INTRAVENOUS
  Filled 2021-03-05: qty 20

## 2021-03-05 MED ORDER — ETOMIDATE 2 MG/ML IV SOLN: 20.0000 mg | Freq: Once | INTRAVENOUS | Status: AC

## 2021-03-05 MED ORDER — DOPAMINE-DEXTROSE 3.2-5 MG/ML-% IV SOLN
0.0000 ug/kg/min | INTRAVENOUS | Status: DC
  Administered 2021-03-05: 5 ug/kg/min via INTRAVENOUS
  Filled 2021-03-05: qty 250

## 2021-03-05 MED ORDER — FENTANYL 2500MCG IN NS 250ML (10MCG/ML) PREMIX INFUSION
0.0000 ug/h | INTRAVENOUS | Status: AC
  Administered 2021-03-05: 25 ug/h via INTRAVENOUS
  Filled 2021-03-05: qty 250

## 2021-03-05 MED ORDER — PROPOFOL 1000 MG/100ML IV EMUL
5.0000 ug/kg/min | INTRAVENOUS | Status: DC
  Administered 2021-03-05: 5 ug/kg/min via INTRAVENOUS
  Filled 2021-03-05: qty 100

## 2021-03-05 MED ORDER — IPRATROPIUM-ALBUTEROL 0.5-2.5 (3) MG/3ML IN SOLN: 3.0000 mL | Freq: Two times a day (BID) | RESPIRATORY_TRACT | Status: DC

## 2021-03-05 MED ORDER — DOCUSATE SODIUM 100 MG PO CAPS
100.0000 mg | ORAL_CAPSULE | Freq: Two times a day (BID) | ORAL | Status: DC | PRN
  Administered 2021-03-07: 100 mg via ORAL
  Filled 2021-03-05: qty 1

## 2021-03-05 NOTE — ED Notes (Signed)
Stopped dopamine per Gleason, PA. Provider is at bedside.

## 2021-03-05 NOTE — ED Notes (Signed)
Pacing pt at this time. HR in 30's.

## 2021-03-05 NOTE — Procedures (Signed)
Extubation Procedure Note  Patient Details:   Name: Derek Hinton DOB: 02-06-1946 MRN: [id number]   Airway Documentation:    Vent end date: 03/05/21 Vent end time: 0923   Evaluation  O2 sats: stable throughout Complications: No apparent complications Patient did tolerate procedure well. Bilateral Breath Sounds: Clear   Pt extubated to 4L [place] per MD order. Pt had positive cuff leak prior to extubation. No stridor noted. Pt able to voice his name.  Vilinda Blanks 03/05/2021, 9:23 AM

## 2021-03-05 NOTE — Progress Notes (Signed)
  Echocardiogram 2D Echocardiogram has been performed.  Derek Hinton 03/05/2021, 2:17 PM

## 2021-03-05 NOTE — ED Provider Notes (Signed)
[facility] EMERGENCY DEPARTMENT Provider Note   CSN: [id number] Arrival date & time: 03/05/21  0533     History Chief Complaint  Patient presents with  . Bradycardia  . Near Syncope  . Dizziness    Derek Hinton is a 75 y.o. male.  The history is provided by the EMS personnel.  Illness Location:  At home, dizzy and near syncope now  Quality:  Near syncope 3rd AVB Onset quality:  Sudden Timing:  Constant Progression:  Unchanged Chronicity:  New Context:  CAD Relieved by:  Nothing  Worsened by:  Time  Ineffective treatments:  External pacing by EMS Associated symptoms: no fever, no rash, no rhinorrhea, no vomiting and no wheezing        Past Medical History:  Diagnosis Date  . COPD (chronic obstructive pulmonary disease) (HCC)    not on home O2  . Diabetes mellitus without complication ([place])   . Hypertension   . PVD (peripheral vascular disease) ([place])    s/p L BKA    Patient Active Problem List   Diagnosis Date Noted  . Left below-knee amputee ([place]) 06/12/2019  . Pressure injury of skin 07/21/2018  . Coronary artery disease involving native coronary artery of native heart without angina pectoris   . Chronic combined systolic and diastolic heart failure ([place])   . Malnutrition of moderate degree 07/18/2018  . Acute encephalopathy 07/16/2018  . Lower limb ischemia 07/16/2018  . PVD (peripheral vascular disease) ([place]) 07/16/2018  . COPD (chronic obstructive pulmonary disease) ([place]) 07/16/2018  . Essential hypertension 07/16/2018  . Type 2 diabetes mellitus with peripheral vascular disease ([place]) 07/16/2018  . Persistent atrial fibrillation ([place]) 07/16/2018    Past Surgical History:  Procedure Laterality Date  . ABDOMINAL AORTOGRAM W/LOWER EXTREMITY N/A 07/18/2018   Procedure: ABDOMINAL AORTOGRAM W/LOWER EXTREMITY;  Surgeon: Angelia Mould, MD;  Location: [facility];  Service: Cardiovascular;  Laterality: N/A;  . BELOW  KNEE LEG AMPUTATION    . PERIPHERAL VASCULAR INTERVENTION Right 07/18/2018   Procedure: PERIPHERAL VASCULAR INTERVENTION;  Surgeon: Angelia Mould, MD;  Location: [facility];  Service: Cardiovascular;  Laterality: Right;  . RIGHT/LEFT HEART CATH AND CORONARY ANGIOGRAPHY N/A 07/21/2018   Procedure: RIGHT/LEFT HEART CATH AND CORONARY ANGIOGRAPHY;  Surgeon: Jettie Booze, MD;  Location: [facility];  Service: Cardiovascular;  Laterality: N/A;       Family History  Problem Relation Age of Onset  . Diabetes Father     Social History   Tobacco Use  . Smoking status: Former Smoker    Packs/day: 1.00    Years: 60.00    Pack years: 60.00    Quit date: 12/2017    Years since quitting: 3.2  . Smokeless tobacco: Never Used  Substance Use Topics  . Alcohol use: Not Currently  . Drug use: Never    Home Medications Prior to Admission medications   Medication Sig Start Date End Date Taking? Authorizing Provider  albuterol (PROVENTIL) (2.5 MG/3ML) 0.083% nebulizer solution Take by nebulization. 03/28/20   [provider]  aspirin 81 MG tablet Take 81 mg by mouth daily.    [provider]  atorvastatin (LIPITOR) 40 MG tablet Take 1 tablet (40 mg total) by mouth daily. 07/24/18   Alma Friendly, MD  BREO ELLIPTA 100-25 MCG/INH AEPB Inhale 2 puffs into the lungs as directed. 08/28/18   [provider]  FARXIGA 10 MG TABS tablet  05/22/19   [provider]  furosemide (LASIX) 40 MG tablet Take 80mg  in the morning and 40mg  in the pm Patient taking differently: 80 mg. Take 80mg  in the morning 08/21/18   Almyra Deforest, PA  glipiZIDE (GLUCOTROL XL) 5 MG 24 hr tablet Take 5 mg by mouth daily. 06/06/18   [provider]  iron polysaccharides (NIFEREX) 150 MG capsule Take 150 mg by mouth daily.    [provider]  metFORMIN (GLUCOPHAGE) 1000 MG tablet Take 500 mg by mouth 2 (two) times daily.  12/18/17   [provider]  metoprolol succinate (TOPROL-XL) 50 MG 24 hr tablet Take 1 tablet (50 mg total) by mouth daily. Take with or immediately following a meal. 08/21/18 07/20/20  Almyra Deforest, PA  Multiple Vitamin (MULTIVITAMIN WITH MINERALS) TABS tablet Take 1 tablet by mouth daily. 07/25/18   Alma Friendly, MD  omeprazole (PRILOSEC) 20 MG capsule Take 1 capsule by mouth 2 (two) times daily before a meal.  09/01/18   [provider]  sacubitril-valsartan (ENTRESTO) 49-51 MG Take 1 tablet by mouth 2 (two) times daily. 08/12/18   Almyra Deforest, PA    Allergies    Penicillins  Review of Systems   Review of Systems  Unable to perform ROS: Acuity of condition  Constitutional: Negative for fever.  HENT: Negative for rhinorrhea.   Respiratory: Negative for wheezing.   Gastrointestinal: Negative for vomiting.  Skin: Negative for rash.  Neurological: Positive for dizziness.    Physical Exam Updated Vital Signs BP 126/69   Pulse (!) 39   Resp (!) 31   Ht 6' (1.829 m)   Wt 63.5 kg Comment: from Nov 2021 records  SpO2 100%   BMI 18.99 kg/m   Physical Exam Vitals and nursing note reviewed.  Constitutional:      Appearance: He is not diaphoretic.  HENT:     Head: Normocephalic and atraumatic.     Nose: Nose normal.  Eyes:     Conjunctiva/sclera: Conjunctivae normal.     Pupils: Pupils are equal, round, and reactive to light.  Cardiovascular:     Rate and Rhythm: Bradycardia present.     Comments: Poor pulses 3rd degree AVB on monitor then asystole Pulmonary:     Breath sounds: No wheezing.  Abdominal:     General: Abdomen is flat. Bowel sounds are normal.     Palpations: Abdomen is soft.     Tenderness: There is no abdominal tenderness. There is no guarding.  Musculoskeletal:     Cervical back: Normal range of motion and neck supple.     Right lower leg: No edema.     Left lower leg: No edema.  Skin:    General: Skin is warm and dry.  Neurological:     GCS: GCS eye  subscore is 1. GCS verbal subscore is 2. GCS motor subscore is 4.     Deep Tendon Reflexes: Reflexes normal.  Psychiatric:     Comments: Unable      ED Results / Procedures / Treatments   Labs (all labs ordered are listed, but only abnormal results are displayed) Results for orders placed or performed during the hospital encounter of 03/05/21  I-stat chem 8, ED (not at [facility] or [facility])  Result Value Ref Range   Sodium 140 135 - 145 mmol/L   Potassium 5.0 3.5 - 5.1 mmol/L   Chloride 105 98 - 111 mmol/L   BUN 56 (H) 8 - 23 mg/dL   Creatinine, Ser 1.60 (H) 0.61 -  1.24 mg/dL   Glucose, Bld 353 (H) 70 - 99 mg/dL   Calcium, Ion 1.04 (L) 1.15 - 1.40 mmol/L   TCO2 24 22 - 32 mmol/L   Hemoglobin 10.5 (L) 13.0 - 17.0 g/dL   HCT 31.0 (L) 39.0 - 52.0 %  I-Stat arterial blood gas, ED  Result Value Ref Range   pH, Arterial 7.377 7.350 - 7.450   pCO2 arterial 43.9 32.0 - 48.0 mmHg   pO2, Arterial 452 (H) 83.0 - 108.0 mmHg   Bicarbonate 25.8 20.0 - 28.0 mmol/L   TCO2 27 22 - 32 mmol/L   O2 Saturation 100.0 %   Acid-Base Excess 0.0 0.0 - 2.0 mmol/L   Sodium 140 135 - 145 mmol/L   Potassium 4.7 3.5 - 5.1 mmol/L   Calcium, Ion 1.14 (L) 1.15 - 1.40 mmol/L   HCT 31.0 (L) 39.0 - 52.0 %   Hemoglobin 10.5 (L) 13.0 - 17.0 g/dL   Patient temperature 98.6 F    Collection site Radial    Drawn by Operator    Sample type ARTERIAL    DG Chest Portable 1 View  Result Date: 03/05/2021 CLINICAL DATA:  Intubation EXAM: PORTABLE CHEST 1 VIEW COMPARISON:  07/06/2020 FINDINGS: Cardiomegaly. Small left pleural effusion. Volume loss in the left chest. Endotracheal tube with tip at the clavicular heads. Extensive artifact from EKG leads. Remote right rib fracture or IMPRESSION: 1. Unremarkable endotracheal tube. 2. Chronic left pleural effusion or pleural thickening with volume loss. No acute finding when compared to 2021 comparison. Electronically Signed   By: Monte Fantasia M.D.   On: 03/05/2021 06:34     EKG EKG Interpretation  Date/Time:  Sunday Mar 05 2021 05:48:20 EDT Ventricular Rate:  45 PR Interval:  676 QRS Duration: 87 QT Interval:  473 QTC Calculation: 366 R Axis:   265 Text Interpretation: No further analysis attempted - not enough leads could be measured Baseline wander in lead(s) III aVR V3 Confirmed by Randal Buba, Eward Rutigliano ([id number]) on 03/05/2021 6:07:51 AM   Radiology No results found.  Procedures Procedure Name: Intubation Date/Time: 03/05/2021 7:03 AM Performed by: Veatrice Kells, MD Pre-anesthesia Checklist: Patient identified, Patient being monitored, Emergency Drugs available, Timeout performed and Suction available Oxygen Delivery Method: Non-rebreather mask Preoxygenation: Pre-oxygenation with 100% oxygen Induction Type: Rapid sequence Ventilation: Mask ventilation without difficulty Laryngoscope Size: Glidescope and 4 Grade View: Grade II Number of attempts: 1 Airway Equipment and Method: Patient positioned with wedge pillow Placement Confirmation: ETT inserted through vocal cords under direct vision,  CO2 detector and Breath sounds checked- equal and bilateral Secured at: 23 cm Tube secured with: ETT holder Dental Injury: Teeth and Oropharynx as per pre-operative assessment  Difficulty Due To: Difficulty was anticipated and Difficult Airway- due to anterior larynx    .Cardioversion  Date/Time: 03/05/2021 7:04 AM Performed by: Veatrice Kells, MD Authorized by: Veatrice Kells, MD   Consent:    Consent obtained:  Emergent situation Pre-procedure details:    Cardioversion basis:  Emergent Comments:     Precordial thump x 2 AVB went into Asystole,         Medications Ordered in ED Medications  succinylcholine (ANECTINE) 200 MG/10ML syringe (has no administration in time range)  DOPamine (INTROPIN) 800 mg in dextrose 5 % 250 mL (3.2 mg/mL) infusion (0 mcg/kg/min  63.5 kg Intravenous Stopped 03/05/21 0631)  docusate sodium (COLACE) capsule 100 mg  (has no administration in time range)  polyethylene glycol (MIRALAX / GLYCOLAX) packet 17 g (has no administration in  time range)  famotidine (PEPCID) 40 MG/5ML suspension 20 mg (has no administration in time range)  propofol (DIPRIVAN) 1000 MG/100ML infusion (25 mcg/kg/min  63.5 kg Intravenous Rate/Dose Change 03/05/21 0701)  fentaNYL 2567mcg in NS 245mL (74mcg/ml) infusion-PREMIX (25 mcg/hr Intravenous New Bag/Given 03/05/21 0610)  rocuronium (ZEMURON) injection 80 mg (80 mg Intravenous Given 03/05/21 0543)  etomidate (AMIDATE) injection 20 mg (20 mg Intravenous Given 03/05/21 0543)    ED Course  I have reviewed the triage vital signs and the nursing notes.  Pertinent labs & imaging results that were available during my care of the patient were reviewed by me and considered in my medical decision making (see chart for details).    MDM Reviewed: nursing note and vitals Interpretation: labs, ECG and x-ray Total time providing critical care: 75-105 minutes (converted to slow AFIB on a dopamine drip, on a fentanyl drip ). This excludes time spent performing separately reportable procedures and services. Consults: critical care and cardiology (cardiology with consult)  CRITICAL CARE Performed by: Brookelynne Dimperio K Shelvy Perazzo-Rasch Total critical care time: 75 minutes Critical care time was exclusive of separately billable procedures and treating other patients. Critical care was necessary to treat or prevent imminent or life-threatening deterioration. Critical care was time spent personally by me on the following activities: development of treatment plan with patient and/or surrogate as well as nursing, discussions with consultants, evaluation of patient's response to treatment, examination of patient, obtaining history from patient or surrogate, ordering and performing treatments and interventions, ordering and review of laboratory studies, ordering and review of radiographic studies, pulse oximetry and  re-evaluation of patient's condition.  Derek Hinton was evaluated in Emergency Department on 03/05/2021 for the symptoms described in the history of present illness. He was evaluated in the context of the global COVID-19 pandemic, which necessitated consideration that the patient might be at risk for infection with the SARS-CoV-2 virus that causes COVID-19. Institutional protocols and algorithms that pertain to the evaluation of patients at risk for COVID-19 are in a state of rapid change based on information released by regulatory bodies including the CDC and federal and state organizations. These policies and algorithms were followed during the patient's care in the ED.  Final Clinical Impression(s) / ED Diagnoses Final diagnoses:  Respiratory arrest ([place])  AV block, 3rd degree ([place])    Admit to ICU   Massiah Longanecker, MD 03/05/21 [phone]

## 2021-03-05 NOTE — H&P (Addendum)
NAME:  Derek Hinton, MRN:  [id number], DOB:  06/18/1946, LOS: 0 ADMISSION DATE:  03/05/2021, CONSULTATION DATE:  03/05/21 REFERRING MD:  EDP, CHIEF COMPLAINT:  bradycardia   History of Present Illness:   75 y.o. M with PMH of HFrEF 20%, COPD, DM, HTN, remote squamous cell carcinoma of skin (removed), prior CVA, and PVD who was brought in after calling EMS reporting feeling "dizzy", having chest pain and  near syncopal event. On arrival found to be bradycardic (HR 30s 3rd degree AVB), he was being paced at the time of arrival and required bagging.  After ED presentation he reportedly "flatlined" and received two precordial thumps before before being externally paced and placed on Dopamine and intubated.  Initial rhythm was heart block, unclear what type.  After being starting Dopamine, pt regained pulses and was intermittently in atrial fib.     Pertinent  Medical History   has a past medical history of COPD (chronic obstructive pulmonary disease) ([place]), Diabetes mellitus without complication ([place]), Hypertension, and PVD (peripheral vascular disease) ([place]).  * history of prior CVA *squamous Cell carcinoma of skin (remote and removed) Significant Hospital Events: Including procedures, antibiotic start and stop dates in addition to other pertinent events   . 5/29 Presented to ED with bradycardia (3rd degree HB), after calling EMS for CP and dizziness. Lost mental status on way to hospital. Required assisted ventilation and external pacemaker. Intubated on arrival by EDP. Briefly started on Dopamine.   Interim History / Subjective:  Now more awake. BP better   Objective   Blood pressure 138/70, pulse (!) 33, resp. rate 19, height 5\' 7"  (1.702 m), weight 63.5 kg, SpO2 100 %.    Vent Mode: PRVC FiO2 (%):  [50 %-100 %] 50 % Set Rate:  [18 bmp] 18 bmp Vt Set:  [530 mL] 530 mL PEEP:  [5 cmH20] 5 cmH20 Plateau Pressure:  [15 cmH20] 15 cmH20  No intake or output data in the 24 hours ending  03/05/21 0640 Filed Weights   03/05/21 0546  Weight: 63.5 kg    Examination: General: 75 year old WM sedated on prop and fent. He is following commands HENT: NCAT no JVD. MMM orally intubated Lungs: clear. No accessory use. Decreased Left base  Cardiovascular: regular irreg HR 60s.  Abdomen: soft not tender + bowel sounds Extremities: cool. Left BKA present. Cap refill brisk. Pulses weak Neuro: follows commands moves all ext GU: due to void   Labs/imaging that I have  personally reviewed  (right click and "Reselect all SmartList Selections" daily)  See below   Resolved Hospital Problem list     Assessment & Plan:  Cardiogenic shock  due to Symptomatic Bradycardia/third degree HB (POA) Has known h/o Combined HF (EF 15-20%) Plan Echo Tele  Dopamine for MAP > 65 Holding entresto and lopressor Hold diuretics Cycle CEs  Severe CAD (3-Vessel dz 2019). W/ severe PAD and prior left BKA (2019) Plan Cont tele Cont asa Cont Lipitor    Chronic atrial fib. (CHADSVasc 8, Only on asa as has chronic thrombocytopenia)  Plan Cont tele  Cont lopressor  Acute respiratory failure (s/p respiratory arrest) as consequence of symptomatic bradycardia)->POA  pcxr ETT good position. CM w/ left basilar vol loss no real acute findings Plan Cont full vent support for now as we proceed w/ cardiac eval VAP bundle PAD protocol  Wean FIO2 for sats ? 92% RASS goal -2 F/u abg Am cxr  COPD (no PFTs on record) Plan Cont BDs  Acute Metabolic encephalopathy 2/2 shock/symptomatic bradycardia Better post intubation  Plan Supportive care PAD protocol   AKI-->suspect from hypoperfusion superimposed on CKD stage 3.  Plan Keep euvolemic Avoid hypotension Serial chem Strict I&O  Diabetes w/ hyperglycemia Plan ssi  Holding farxiga, metformin and glipizide  Chronic thrombocytopenia  -looks like range 60s-100 Plan Trend   Best practice (right click and "Reselect all SmartList  Selections" daily)  Diet:  NPO Pain/Anxiety/Delirium protocol (if indicated): Yes (RASS goal -1,-2) VAP protocol (if indicated): Yes DVT prophylaxis: Subcutaneous Heparin GI prophylaxis: PPI Glucose control:  SSI Yes Central venous access:  N/A Arterial line:  N/A Foley:  N/A Mobility:  bed rest  PT consulted: N/A Last date of multidisciplinary goals of care discussion [pending] Code Status:  full code Disposition:  icu for cardiac eval   Labs   CBC: Recent Labs  Lab 03/05/21 0621 03/05/21 0627  HGB 10.5* 10.5*  HCT 31.0* 31.0*    Basic Metabolic Panel: Recent Labs  Lab 03/05/21 0621 03/05/21 0627  NA 140 140  K 4.7 5.0  CL  --  105  GLUCOSE  --  353*  BUN  --  56*  CREATININE  --  1.60*   GFR: Estimated Creatinine Clearance: 35.3 mL/min (A) (by C-G formula based on SCr of 1.6 mg/dL (H)). No results for input(s): PROCALCITON, WBC, LATICACIDVEN in the last 168 hours.  Liver Function Tests: No results for input(s): AST, ALT, ALKPHOS, BILITOT, PROT, ALBUMIN in the last 168 hours. No results for input(s): LIPASE, AMYLASE in the last 168 hours. No results for input(s): AMMONIA in the last 168 hours.  ABG    Component Value Date/Time   PHART 7.377 03/05/2021 0621   PCO2ART 43.9 03/05/2021 0621   PO2ART 452 (H) 03/05/2021 0621   HCO3 25.8 03/05/2021 0621   TCO2 24 03/05/2021 0627   ACIDBASEDEF 5.0 (H) 07/21/2018 0821   O2SAT 100.0 03/05/2021 0621     Coagulation Profile: No results for input(s): INR, PROTIME in the last 168 hours.  Cardiac Enzymes: No results for input(s): CKTOTAL, CKMB, CKMBINDEX, TROPONINI in the last 168 hours.  HbA1C: Hgb A1c MFr Bld  Date/Time Value Ref Range Status  07/16/2018 08:55 AM 7.9 (H) 4.8 - 5.6 % Final    Comment:    (NOTE) Pre diabetes:          5.7%-6.4% Diabetes:              >6.4% Glycemic control for   <7.0% adults with diabetes     CBG: No results for input(s): GLUCAP in the last 168 hours.  Review of  Systems:   Not able   Past Medical History:  He,  has a past medical history of COPD (chronic obstructive pulmonary disease) ([place]), Diabetes mellitus without complication ([place]), Hypertension, and PVD (peripheral vascular disease) ([place]).   Surgical History:   Past Surgical History:  Procedure Laterality Date  . ABDOMINAL AORTOGRAM W/LOWER EXTREMITY N/A 07/18/2018   Procedure: ABDOMINAL AORTOGRAM W/LOWER EXTREMITY;  Surgeon: Angelia Mould, MD;  Location: [facility];  Service: Cardiovascular;  Laterality: N/A;  . BELOW KNEE LEG AMPUTATION    . PERIPHERAL VASCULAR INTERVENTION Right 07/18/2018   Procedure: PERIPHERAL VASCULAR INTERVENTION;  Surgeon: Angelia Mould, MD;  Location: [facility];  Service: Cardiovascular;  Laterality: Right;  . RIGHT/LEFT HEART CATH AND CORONARY ANGIOGRAPHY N/A 07/21/2018   Procedure: RIGHT/LEFT HEART CATH AND CORONARY ANGIOGRAPHY;  Surgeon: Jettie Booze, MD;  Location: [facility];  Service: Cardiovascular;  Laterality: N/A;     Social History:   reports that he quit smoking about 3 years ago. He has a 60.00 pack-year smoking history. He has never used smokeless tobacco. He reports previous alcohol use. He reports that he does not use drugs.   Family History:  His family history includes Diabetes in his father.   Allergies Allergies  Allergen Reactions  . Penicillins Other (See Comments)    Pt unsure, states he "didn't feel good" Patient states it made him "feel Funny"      Home Medications  Prior to Admission medications   Medication Sig Start Date End Date Taking? Authorizing Provider  albuterol (PROVENTIL) (2.5 MG/3ML) 0.083% nebulizer solution Take by nebulization. 03/28/20   [provider]  aspirin 81 MG tablet Take 81 mg by mouth daily.    [provider]  atorvastatin (LIPITOR) 40 MG tablet Take 1 tablet (40 mg total) by mouth daily. 07/24/18   Alma Friendly, MD  BREO  ELLIPTA 100-25 MCG/INH AEPB Inhale 2 puffs into the lungs as directed. 08/28/18   [provider]  FARXIGA 10 MG TABS tablet  05/22/19   [provider]  furosemide (LASIX) 40 MG tablet Take 80mg  in the morning and 40mg  in the pm Patient taking differently: 80 mg. Take 80mg  in the morning 08/21/18   Almyra Deforest, PA  glipiZIDE (GLUCOTROL XL) 5 MG 24 hr tablet Take 5 mg by mouth daily. 06/06/18   [provider]  iron polysaccharides (NIFEREX) 150 MG capsule Take 150 mg by mouth daily.    [provider]  metFORMIN (GLUCOPHAGE) 1000 MG tablet Take 500 mg by mouth 2 (two) times daily.  12/18/17   [provider]  metoprolol succinate (TOPROL-XL) 50 MG 24 hr tablet Take 1 tablet (50 mg total) by mouth daily. Take with or immediately following a meal. 08/21/18 07/20/20  Almyra Deforest, PA  Multiple Vitamin (MULTIVITAMIN WITH MINERALS) TABS tablet Take 1 tablet by mouth daily. 07/25/18   Alma Friendly, MD  omeprazole (PRILOSEC) 20 MG capsule Take 1 capsule by mouth 2 (two) times daily before a meal.  09/01/18   [provider]  sacubitril-valsartan (ENTRESTO) 49-51 MG Take 1 tablet by mouth 2 (two) times daily. 08/12/18   Almyra Deforest, PA     Critical care time: 32 min   Erick Colace ACNP-BC [place] Pager # [phone] OR # [phone] if no answer    Pulmonary to care attending:  This is a 75 year old gentleman, past medical history of heart failure with reduced ejection fraction, EF 20%, COPD, diabetes, hypertension, prior CVA.  Patient presented with syncopal episode bradycardia with third-degree AV block.  Was on dobutamine briefly.  Intubated when he was unresponsive.  Is seen and evaluated this morning in the intensive care unit he is much more awake alert.  Heart rate stable.  Followed by outpatient cardiology.  Case discussed with Dr. Harrell Gave from cardiology services who have touched base with the family.  Patient  was a DNR prior to coming into the hospital.  Was not a candidate for an AICD in the past.  There are to look into neck steps and discuss options with patient's family.  Patient is awake and alert on mechanical support following SAT SBT parameters.  Will consider liberation from mechanical support and observation in the ICU.  BP 140/83   Pulse 66   Temp (!) 97 F (36.1 C) (Rectal)  Resp 20   Ht 5\' 7"  (1.702 m)   Wt 63.5 kg Comment: from Nov 2021 records  SpO2 100%   BMI 21.93 kg/m   General: Elderly male intubated on mechanical life support following commands, critically ill HEENT: Endotracheal tube in place, NCAT, tracking appropriately Heart: Bradycardic, S1-S2 Lungs: Bilateral mechanically ventilated breath sounds Abdomen: Soft nontender nondistended  Labs: Reviewed Potassium 5 Serum creatinine 1.6 Hemoglobin 10.5 MRSA PCR negative Blood cultures no growth to date Troponin 79.  Assessment: Acute hypoxemic respiratory failure requiring intubation mechanical ventilation unable to protect airway when unresponsive and bradycardic Cardiogenic shock, symptomatic bradycardia Has a known chronic systolic heart failure Severe CAD, Peripheral arterial disease COPD Acute metabolic encephalopathy secondary to above AKI secondary to above Diabetes with hyperglycemia Chronic, cytopenia All diseases listed above present on admission  Plan: SAT SBT Extubate if able. Observe in the ICU post extubation At risk for recurrent respiratory failure. Dopamine as needed Hold diuretics Hold Entresto and Lopressor Continue telemetry monitoring ASA plus Lipitor continue  This patient is critically ill with multiple organ system failure; which, requires frequent high complexity decision making, assessment, support, evaluation, and titration of therapies. This was completed through the application of advanced monitoring technologies and extensive interpretation of multiple databases. During  this encounter critical care time was devoted to patient care services described in this note for 40 minutes.  Garner Nash, DO [facility] 03/05/2021 10:27 AM

## 2021-03-05 NOTE — ED Notes (Signed)
Cardiac rhythm converted to atrial fibrillation. EKG obtained.

## 2021-03-05 NOTE — Consult Note (Addendum)
Cardiology Consultation:   Patient ID: Derek Hinton; [id number]; [birth year]   Admit date: 03/05/2021 Date of Consult: 03/05/2021  Primary Care Provider: Guadalupe Maple, MD Primary Cardiologist: Dr. Sallyanne Kuster, MD   Patient Profile:   Derek Hinton is a 75 y.o. male with a hx of chronic systolic CHF with an EF at 15-20%, hx of CAD with severe 3VD per cath in 2019 with recs for medical management, PAD s/p PTA to R external iliac 07/2018 s/p L BKA with prosthetic, persistent atrial fibrillation not on anticoagulation, DM2, COPD, and hx of ischemic CVA who is being seen today for the evaluation of near syncope with CHB at the request of Dr. Randal Buba.  History of Present Illness:   Derek Hinton is a 75 yo M with a hx as stated above who presented to [facility] after a near syncopal event at home. HPI obtained from chart review, APP with PCCM, RN and patient's son.  Son reports that he lives at a nursing home however was staying with him over the [place] Day weekend.  He states that approximately 1:30 AM he heard his dad called out for help.  At that time patient was feeling a dizzy and near syncopal with chest pain.  He went back to bed for approximately 30 minutes and again woke up asking for help at which time he had diaphoresis and nausea.  He drank some water and went back to sleep.  He called out once again at which time his son called EMS for further assistance.  On these occasions, patient was mentating well with no evidence of LOC.  On EMS arrival patient was found to be bradycardic with HR in the 20- 30's with CHB. EMS placed on external pacing pads. He was intubated and transported to [facility]. He was intermittently paced on arrival with an underlying rhythm of atrial fibrillation with slow VR.  He was initially placed on dopamine, fentanyl and propofol however dopamine has now been discontinued.  He is no longer being externally paced.  Underlying heart rhythm with persistent atrial  fibrillation and stable rates.  Spoke with his son regarding CODE STATUS.  At this time he remains a full code however son states that he has DNR paperwork at his facility.  I asked that he discuss this with his siblings to guide our care going forward.  He is followed by Dr. Sallyanne Kuster for his cardiology care. He presented to the hospital in 07/2018 after being found down in his home. On arrival to [facility], he was noted to be atrial fibrillation with concern of right lower extremity ischemia.  Head CT was negative for acute issue, however does have a history of chronic ischemic microvascular white matter disease and a small remote left thalamic lacunar infarct. Vascular surgery was consulted for possible critical limb ischemia.  Patient was seen by Dr. Donnetta Hutching who did not feel there was profound ischemia to warrant emergent intervention. Lower extremity arterial Doppler shows 75 to 99% stenosis in the right proximal to mid superficial femoral artery.  Unfortunately, echocardiogram during that admission showed severely reduced LVEF at 15 to 20%, severe TR, mild MR, grade 2 DD, PA peak pressure 47 mmHg.  He underwent lower extremity PV angiogram and had successful angioplasty and stenting of right external iliac artery.  He was initially placed on aspirin and Plavix, however Plavix was taken off in the hospital due to significant bleeding and bruising. Cardiac catheterization performed on 07/21/2018 showed severe triple-vessel disease with 80%  proximal RCA, 80% mid RCA, 90% ostial RPDA, 80% proximal to mid left circumflex, 90% proximal LAD, 75% ostial D1, 75% mid LAD lesion. Cardiac index 2.68.  Patient was seen by CT surgery, however was turned down. It was recommended to manage his coronary artery disease medically.      Past Medical History:  Diagnosis Date   COPD (chronic obstructive pulmonary disease) ([place])    not on home O2   Diabetes mellitus without complication ([place])    Hypertension     PVD (peripheral vascular disease) ([place])    s/p L BKA    Past Surgical History:  Procedure Laterality Date   ABDOMINAL AORTOGRAM W/LOWER EXTREMITY N/A 07/18/2018   Procedure: ABDOMINAL AORTOGRAM W/LOWER EXTREMITY;  Surgeon: Angelia Mould, MD;  Location: [facility];  Service: Cardiovascular;  Laterality: N/A;   BELOW KNEE LEG AMPUTATION     PERIPHERAL VASCULAR INTERVENTION Right 07/18/2018   Procedure: PERIPHERAL VASCULAR INTERVENTION;  Surgeon: Angelia Mould, MD;  Location: [facility];  Service: Cardiovascular;  Laterality: Right;   RIGHT/LEFT HEART CATH AND CORONARY ANGIOGRAPHY N/A 07/21/2018   Procedure: RIGHT/LEFT HEART CATH AND CORONARY ANGIOGRAPHY;  Surgeon: Jettie Booze, MD;  Location: [facility];  Service: Cardiovascular;  Laterality: N/A;     Prior to Admission medications   Medication Sig Start Date End Date Taking? Authorizing Provider  albuterol (PROVENTIL) (2.5 MG/3ML) 0.083% nebulizer solution Take by nebulization. 03/28/20   [provider]  aspirin 81 MG tablet Take 81 mg by mouth daily.    [provider]  atorvastatin (LIPITOR) 40 MG tablet Take 1 tablet (40 mg total) by mouth daily. 07/24/18   Alma Friendly, MD  BREO ELLIPTA 100-25 MCG/INH AEPB Inhale 2 puffs into the lungs as directed. 08/28/18   [provider]  FARXIGA 10 MG TABS tablet  05/22/19   [provider]  furosemide (LASIX) 40 MG tablet Take 80mg  in the morning and 40mg  in the pm Patient taking differently: 80 mg. Take 80mg  in the morning 08/21/18   Almyra Deforest, PA  glipiZIDE (GLUCOTROL XL) 5 MG 24 hr tablet Take 5 mg by mouth daily. 06/06/18   [provider]  iron polysaccharides (NIFEREX) 150 MG capsule Take 150 mg by mouth daily.    [provider]  metFORMIN (GLUCOPHAGE) 1000 MG tablet Take 500 mg by mouth 2 (two) times daily.  12/18/17   [provider]  metoprolol succinate (TOPROL-XL) 50 MG  24 hr tablet Take 1 tablet (50 mg total) by mouth daily. Take with or immediately following a meal. 08/21/18 07/20/20  Almyra Deforest, PA  Multiple Vitamin (MULTIVITAMIN WITH MINERALS) TABS tablet Take 1 tablet by mouth daily. 07/25/18   Alma Friendly, MD  omeprazole (PRILOSEC) 20 MG capsule Take 1 capsule by mouth 2 (two) times daily before a meal.  09/01/18   [provider]  sacubitril-valsartan (ENTRESTO) 49-51 MG Take 1 tablet by mouth 2 (two) times daily. 08/12/18   Almyra Deforest, PA    Inpatient Medications: Scheduled Meds:  famotidine  20 mg Per Tube BID   succinylcholine       Continuous Infusions:  DOPamine Stopped (03/05/21 0631)   propofol (DIPRIVAN) infusion 25 mcg/kg/min (03/05/21 0701)   PRN Meds: docusate sodium, polyethylene glycol  Allergies:    Allergies  Allergen Reactions   Penicillins Other (See Comments)    Pt unsure, states he "didn't feel good" Patient states it made him "feel Funny"  Social History:   Social History   Socioeconomic History   Marital status: Divorced    Spouse name: Not on file   Number of children: Not on file   Years of education: Not on file   Highest education level: Not on file  Occupational History   Occupation: retired  Tobacco Use   Smoking status: Former Smoker    Packs/day: 1.00    Years: 60.00    Pack years: 60.00    Quit date: 12/2017    Years since quitting: 3.2   Smokeless tobacco: Never Used  Substance and Sexual Activity   Alcohol use: Not Currently   Drug use: Never   Sexual activity: Not on file  Other Topics Concern   Not on file  Social History Narrative   Not on file   Social Determinants of Health   Financial Resource Strain: Not on file  Food Insecurity: Not on file  Transportation Needs: Not on file  Physical Activity: Not on file  Stress: Not on file  Social Connections: Not on file  Intimate Partner Violence: Not on file    Family History:   Family History  Problem Relation  Age of Onset   Diabetes Father    Family Status:  Family Status  Relation Name Status   Father  Deceased    ROS:  Please see the history of present illness.  All other ROS reviewed and negative.     Physical Exam/Data:   Vitals:   03/05/21 0654 03/05/21 0655 03/05/21 0700 03/05/21 0702  BP: (!) 198/114  (!) 185/80 (!) 165/84  Pulse:   85 (!) 59  Resp: 18  16 14   Temp:  (!) 97 F (36.1 C)    TempSrc:  Rectal    SpO2:   100% 100%  Weight:      Height:       No intake or output data in the 24 hours ending 03/05/21 0714 Filed Weights   03/05/21 0546  Weight: 63.5 kg   Body mass index is 21.93 kg/m.   EKG:  The EKG was personally reviewed and demonstrates: 03/05/2021 atrial fibrillation with HR 74 bpm and no acute changes Telemetry:  Telemetry was personally reviewed and demonstrates: 03/05/2021 atrial fibrillation with HR 60s to 80s however had episodes of CHB/pausing per telemetry review while in the ED.  Strips printed and reviewed by MD  General: Elderly, NAD Head: Normocephalic, atraumatic, sclera non-icteric, no xanthomas, clear, moist mucus membranes. Neck: Negative for carotid bruits. No JVD Lungs:Clear to ausculation bilaterally.  Intubated Cardiovascular: Irregularly irregular. + murmur Abdomen: Soft, non-tender, non-distended. No obvious abdominal masses. Extremities: No edema.  Radial DP/PT pulses 2+ bilaterally Neuro: Intubated/sedated Psych: Intubated/sedated   Relevant CV Studies:  Echocardiogram 07/17/2018:  - Left ventricle: The cavity size was mildly dilated. Systolic    function was severely reduced. The estimated ejection fraction    was in the range of 15% to 20%. Diffuse hypokinesis. Features are    consistent with a pseudonormal left ventricular filling pattern,    with concomitant abnormal relaxation and increased filling    pressure (grade 2 diastolic dysfunction).  - Aortic valve: Trileaflet; mildly thickened, mildly calcified    leaflets.   - Mitral valve: There was mild regurgitation.  - Left atrium: The atrium was severely dilated. Volume/bsa, ES    (1-plane Simpson&'s, A4C): 69.5 ml/m^2.  - Right atrium: The atrium was severely dilated.  - Tricuspid valve: There was severe regurgitation.  - Pulmonary arteries: Systolic  pressure was moderately increased.    PA peak pressure: 47 mm Hg (S).  - Pericardium, extracardiac: There was a left pleural effusion.   R/LHC 07/21/2018: Prox RCA lesion is 80% stenosed. Mid RCA lesion is 80% stenosed. Ost RPDA to RPDA lesion is 90% stenosed. Prox Cx to Mid Cx lesion is 80% stenosed. Prox LAD lesion is 90% stenosed. Ost 1st Diag lesion is 75% stenosed. Mid LAD lesion is 75% stenosed. LV end diastolic pressure is normal. There is no aortic valve stenosis. Hemodynamic findings consistent with mild pulmonary hypertension. Ao sat 90%, PA 58%, PA pressure 54/19, mean PA 32 mm Hg; PCWP 16/17; mean PCWP 14 mm Hg; CO 4.7 L/min; CI 2.68   Severe three vessel disease in the setting of low EF.  Plan for cardiac surgery consult.  If he is not a candidate for CABG, would have to consider medical therapy vs. Atherectomy of LAD.    Plavix on hold currently in the event of cardiac surgery.  Would need to restart depending on plan of treatment.  \   Diagnostic Dominance: Right    Intervention    Laboratory Data:  Chemistry Recent Labs  Lab 03/05/21 0621 03/05/21 0627  NA 140 140  K 4.7 5.0  CL  --  105  GLUCOSE  --  353*  BUN  --  56*  CREATININE  --  1.60*    Total Protein  Date Value Ref Range Status  08/12/2018 6.5 6.0 - 8.5 g/dL Final   Albumin  Date Value Ref Range Status  08/12/2018 3.4 (L) 3.5 - 4.8 g/dL Final   AST  Date Value Ref Range Status  08/12/2018 21 0 - 40 IU/L Final   ALT  Date Value Ref Range Status  08/12/2018 16 0 - 44 IU/L Final   Alkaline Phosphatase  Date Value Ref Range Status  08/12/2018 111 39 - 117 IU/L Final   Bilirubin Total  Date  Value Ref Range Status  08/12/2018 0.4 0.0 - 1.2 mg/dL Final   Hematology Recent Labs  Lab 03/05/21 0621 03/05/21 0627  HGB 10.5* 10.5*  HCT 31.0* 31.0*   Cardiac EnzymesNo results for input(s): TROPONINI in the last 168 hours. No results for input(s): TROPIPOC in the last 168 hours.  BNPNo results for input(s): BNP, PROBNP in the last 168 hours.  DDimer No results for input(s): DDIMER in the last 168 hours. TSH: No results found for: TSH Lipids:No results found for: CHOL, HDL, LDLCALC, LDLDIRECT, TRIG, CHOLHDL HgbA1c: Lab Results  Component Value Date   HGBA1C 7.9 (H) 07/16/2018    Radiology/Studies:  DG Chest Portable 1 View  Result Date: 03/05/2021 CLINICAL DATA:  Intubation EXAM: PORTABLE CHEST 1 VIEW COMPARISON:  07/06/2020 FINDINGS: Cardiomegaly. Small left pleural effusion. Volume loss in the left chest. Endotracheal tube with tip at the clavicular heads. Extensive artifact from EKG leads. Remote right rib fracture or IMPRESSION: 1. Unremarkable endotracheal tube. 2. Chronic left pleural effusion or pleural thickening with volume loss. No acute finding when compared to 2021 comparison. Electronically Signed   By: Monte Fantasia M.D.   On: 03/05/2021 06:34   Assessment and Plan:   1.  Near syncope with CHB/asystole: -Patient lives at a nursing home facility however was staying with his son over the long weekend.  Called out several times during the early morning with complaints of dizziness and near syncope along with chest pain, nausea and diaphoresis.  On EMS arrival, patient found to be in complete heart block  with HR in the 20s to 30s.  He was intermittently symptomatic however and transit to [facility] was requiring external pacing and intubation.  He was placed on fentanyl and propofol along with dopamine.  Dopamine has since been discontinued with underlying rhythm as atrial fibrillation with stable rates.  Per telemetry review, there is episodes of CHB and asystole.  He is on AV  nodal blocking agents with Toprol-XL 50 mg daily which is now being held.  Electrolytes appear to be stable with K+ at 5.0 and magnesium at 2.0.  Creatinine elevated above most recent baseline at 0.8>>> now up to 1.6.  He has many risk factors for arrhythmias including an LVEF at 15 to 20% per last echocardiogram along with severe three-vessel CAD not felt to be a candidate for revascularization.   -Would cycle troponins and repeat echocardiogram.  -Discussed CODE STATUS with patient's son who reports he has a DNR sheet at his facility.  I explained as of now, he is a full code and we will need to verify his wishes proceeding forward.  2.  Severe CAD per LHC 2019: Last cardiac catheterization 07/21/2018 which showed severe three vessel disease in the setting of low EF.  Plan was for cardiac surgery consult however was not felt to be a candidate. Per cath notes, could consider medical versus arthrectomy of LAD.  Unfortunately, with critical arrhythmias, AKI, and multiple comorbid conditions, likely not a candidate for further invasive ischemic procedures.  MD to follow with final recs -Noted to have significant bleeding while on Plavix therefore currently on ASA only -Would cycle troponins and if significantly elevated would consider IV heparin x48 hours although hemoglobin appears to be moderately low at 10.5 -Continue ASA 81, atorvastatin 40 -Holding Toprol due to CHB  3.  Ischemic cardiomyopathy/chronic systolic CHF: -Last echocardiogram the EF at 15% -Maintained in outpatient setting with Farxiga 10, Lasix 80 mg a.m./40 mg p.m. and Entresto 49-51 -Would hold Entresto given AKI for now -Does not appear to be fluid volume overloaded on exam -We will add IV Lasix if needed -No acute cardiopulmonary changes on CXR>> has chronic left pleural effusion  4.  PAD: -Followed by vascular surgery -History of left BKA with prosthetic  5.  DM2: -Glucose elevated on ED arrival at -[facility] for glucose control  inpatient status -On PTA [facility] bedside, metformin  6.  Hypertension: -Noted to be markedly hypertensive on ED arrival with SBP in the 180-200 range however was hypotensive during episodes of CHB with BPs as low as 66/54 -BP soft currently but stable at 93/68  7.  Persistent atrial fibrillation: -Noted to have a history of persistent atrial fibrillation with slow ventricular response not on anticoagulation felt to be high fall risk due to left BKA and history of significant bleeding while on Plavix.  Given no recent neurological changes at last office visit plan was to continue with ASA only.  8.  Acute hypoxic respiratory failure: -Patient intubated due to acute hypoxic respiratory failure likely in the setting of cardiac events leading to his hospitalization -PCCM following for management, appreciate their recommendations   For questions or updates, please contact [place] Please consult [email or web address] for contact info under Cardiology/STEMI.   SignedKathyrn Drown NP-C [facility] Pager: [phone] 03/05/2021 7:14 AM

## 2021-03-05 NOTE — Progress Notes (Signed)
RT NOTE: patient transported from ED to room 9F07 without complications.

## 2021-03-05 NOTE — Progress Notes (Addendum)
eLink Physician-Brief Progress Note Patient Name: Aston Lieske DOB: 11-08-1945 MRN: [id number]   Date of Service  03/05/2021  HPI/Events of Note  Pt MAP 40, per Dr Luther Hearing "dopamine to keep MAP > 65". Dopamine caused increased ectopt/ NSVT. Pt made DNR earlier, RN asking if a differant pressor can be used.  Labs: Cr 1.6 S/p extubated this AM. On 4 lit BNP 946. Hg 10.5  EF 20.  A fib , HR 60's.   eICU Interventions  - start Levophed gtt. Hold Dopamine. Via PIV, if needing over 10 mcg/min- to call back for a central line.  - get K/Mag and ion calcium, replace if low.  Discussed with RN.      Intervention Category Intermediate Interventions: Hypotension - evaluation and management  Elmer Sow 03/05/2021, 10:05 PM

## 2021-03-05 NOTE — Progress Notes (Signed)
RN spoke with Jenny Reichmann, RN with [facility] in regards to pt status and plan. Pt BP is low (94/44 MAP60), and HR is ranging from 40-60bpm. At this time the patient is DNR but is NOT comfort care. Therefore, plans are to restart dopamine infusion at this time.

## 2021-03-05 NOTE — ED Triage Notes (Signed)
EMS reports pt is from home. Pt was alert and oriented x 4 on arrival. Heart rate in 30's. C/O dizziness, near syncope, and chest pain. Pt became unresponsive and respirations decreased. Assisted ventilations with ambu bag. Given versed 2.5mg  IVP. Unable to pick up pulse ox. BP - 135/58, HR - 30.

## 2021-03-05 NOTE — Progress Notes (Signed)
Goals of care: I had a conversation with the patient, his son Derek Hinton (in person) and daughter Derek Hinton (via speakerphone). Reviewed events of today. Discussed what we know/don't know. Discussed goals of care, what he would want if he went into cardiac arrest.  He has a DNR form at his facility. He has stated that he wouldn't want invasive procedures or to be kept alive on machines. We discussed full code, do not resusciate, and comfort care. He confirmed that he wants to be DNR. He also had some questions about other choices, and we discussed living will, advanced directives, and [place] MOST form.  I will see if anyone is available to go over these with him tomorrow and encouraged the patient and his family to continue to discuss what he would want. He currently looks clinically stable, awake/alert, normal vitals.  I will place DNR order.  Derek Dresser, MD, PhD, [facility]  [address], [place], [place] [address] [phone]

## 2021-03-06 DIAGNOSIS — I25118 Atherosclerotic heart disease of native coronary artery with other forms of angina pectoris: Secondary | ICD-10-CM

## 2021-03-06 DIAGNOSIS — I4891 Unspecified atrial fibrillation: Secondary | ICD-10-CM | POA: Diagnosis not present

## 2021-03-06 DIAGNOSIS — R570 Cardiogenic shock: Secondary | ICD-10-CM | POA: Diagnosis not present

## 2021-03-06 DIAGNOSIS — E1151 Type 2 diabetes mellitus with diabetic peripheral angiopathy without gangrene: Secondary | ICD-10-CM

## 2021-03-06 DIAGNOSIS — N179 Acute kidney failure, unspecified: Secondary | ICD-10-CM | POA: Diagnosis not present

## 2021-03-06 DIAGNOSIS — R092 Respiratory arrest: Secondary | ICD-10-CM

## 2021-03-06 DIAGNOSIS — I5022 Chronic systolic (congestive) heart failure: Secondary | ICD-10-CM

## 2021-03-06 LAB — CBC
HCT: 33.4 % — ABNORMAL LOW (ref 39.0–52.0)
Hemoglobin: 10.6 g/dL — ABNORMAL LOW (ref 13.0–17.0)
MCH: 31 pg (ref 26.0–34.0)
MCHC: 31.7 g/dL (ref 30.0–36.0)
MCV: 97.7 fL (ref 80.0–100.0)
Platelets: 124 10*3/uL — ABNORMAL LOW (ref 150–400)
RBC: 3.42 MIL/uL — ABNORMAL LOW (ref 4.22–5.81)
RDW: 15.1 % (ref 11.5–15.5)
WBC: 9 10*3/uL (ref 4.0–10.5)
nRBC: 0 % (ref 0.0–0.2)

## 2021-03-06 LAB — GLUCOSE, CAPILLARY
Glucose-Capillary: 109 mg/dL — ABNORMAL HIGH (ref 70–99)
Glucose-Capillary: 171 mg/dL — ABNORMAL HIGH (ref 70–99)
Glucose-Capillary: 232 mg/dL — ABNORMAL HIGH (ref 70–99)
Glucose-Capillary: 253 mg/dL — ABNORMAL HIGH (ref 70–99)
Glucose-Capillary: 240 mg/dL — ABNORMAL HIGH (ref 70–99)

## 2021-03-06 LAB — COMPREHENSIVE METABOLIC PANEL
ALT: 58 U/L — ABNORMAL HIGH (ref 0–44)
AST: 76 U/L — ABNORMAL HIGH (ref 15–41)
Albumin: 3.6 g/dL (ref 3.5–5.0)
Alkaline Phosphatase: 98 U/L (ref 38–126)
Anion gap: 9 (ref 5–15)
BUN: 57 mg/dL — ABNORMAL HIGH (ref 8–23)
CO2: 26 mmol/L (ref 22–32)
Calcium: 9 mg/dL (ref 8.9–10.3)
Chloride: 105 mmol/L (ref 98–111)
Creatinine, Ser: 1.56 mg/dL — ABNORMAL HIGH (ref 0.61–1.24)
GFR, Estimated: 46 mL/min — ABNORMAL LOW (ref >60)
Glucose, Bld: 99 mg/dL (ref 70–99)
Potassium: 4.5 mmol/L (ref 3.5–5.1)
Sodium: 140 mmol/L (ref 135–145)
Total Bilirubin: 0.6 mg/dL (ref 0.3–1.2)
Total Protein: 6.4 g/dL — ABNORMAL LOW (ref 6.5–8.1)

## 2021-03-06 LAB — LIPID PANEL
Cholesterol: 68 mg/dL (ref 0–200)
HDL: 25 mg/dL — ABNORMAL LOW (ref >40)
LDL Cholesterol: 34 mg/dL (ref 0–99)
Total CHOL/HDL Ratio: 2.7 RATIO
Triglycerides: 45 mg/dL (ref <150)
VLDL: 9 mg/dL (ref 0–40)

## 2021-03-06 LAB — MAGNESIUM: Magnesium: 2.3 mg/dL (ref 1.7–2.4)

## 2021-03-06 LAB — BRAIN NATRIURETIC PEPTIDE: B Natriuretic Peptide: 2293.2 pg/mL — ABNORMAL HIGH (ref 0.0–100.0)

## 2021-03-06 MED ORDER — ATORVASTATIN CALCIUM 40 MG PO TABS
40.0000 mg | ORAL_TABLET | Freq: Every day | ORAL | Status: DC
  Administered 2021-03-06 – 2021-03-09 (×3): 40 mg via ORAL
  Filled 2021-03-06 (×3): qty 1

## 2021-03-06 MED ORDER — IPRATROPIUM-ALBUTEROL 0.5-2.5 (3) MG/3ML IN SOLN: 3.0000 mL | Freq: Four times a day (QID) | RESPIRATORY_TRACT | Status: DC | PRN

## 2021-03-06 MED ORDER — PANTOPRAZOLE SODIUM 40 MG PO TBEC
40.0000 mg | DELAYED_RELEASE_TABLET | Freq: Every day | ORAL | Status: DC
  Administered 2021-03-06 – 2021-03-09 (×3): 40 mg via ORAL
  Filled 2021-03-06 (×3): qty 1

## 2021-03-06 MED ORDER — ASPIRIN 81 MG PO CHEW
81.0000 mg | CHEWABLE_TABLET | Freq: Every day | ORAL | Status: DC
  Administered 2021-03-06 – 2021-03-07 (×2): 81 mg via ORAL
  Filled 2021-03-06 (×2): qty 1

## 2021-03-06 MED ORDER — CHLORHEXIDINE GLUCONATE CLOTH 2 % EX PADS
6.0000 | MEDICATED_PAD | Freq: Every day | CUTANEOUS | Status: DC
  Administered 2021-03-07 – 2021-03-09 (×3): 6 via TOPICAL

## 2021-03-06 MED ORDER — ISOPROTERENOL HCL 0.2 MG/ML IJ SOLN
2.0000 ug/min | INTRAVENOUS | Status: DC
  Administered 2021-03-06 – 2021-03-07 (×2): 2 ug/min via INTRAVENOUS
  Filled 2021-03-06 (×2): qty 5

## 2021-03-06 NOTE — Progress Notes (Addendum)
   NAME:  Derek Hinton, MRN:  [id number], DOB:  Jan 28, 1946, LOS: 1 ADMISSION DATE:  03/05/2021, CONSULTATION DATE:  03/06/21 REFERRING MD:  EDP, CHIEF COMPLAINT:  bradycardia   History of Present Illness:   75 y.o. M with PMH of HFrEF 20%, COPD, DM, HTN, remote squamous cell carcinoma of skin (removed), prior CVA, and PVD who was brought in after calling EMS reporting feeling "dizzy", having chest pain and  near syncopal event. On arrival found to be bradycardic (HR 30s 3rd degree AVB), he was being paced at the time of arrival and required bagging.  After ED presentation he reportedly "flatlined" and received two precordial thumps before before being externally paced and placed on Dopamine and intubated.  Initial rhythm was heart block, unclear what type.  After being starting Dopamine, pt regained pulses and was intermittently in atrial fib.     Pertinent  Medical History   has a past medical history of COPD (chronic obstructive pulmonary disease) ([place]), Diabetes mellitus without complication ([place]), Hypertension, and PVD (peripheral vascular disease) ([place]).  * history of prior CVA *squamous Cell carcinoma of skin (remote and removed)  Significant Hospital Events: Including procedures, antibiotic start and stop dates in addition to other pertinent events   . 5/29 Presented to ED with bradycardia (3rd degree HB), after calling EMS for CP and dizziness. Lost mental status on way to hospital. Required assisted ventilation and external pacemaker. Intubated on arrival by EDP. Briefly started on Dopamine.   Interim History / Subjective:  Weaned off levophed this AM. Denies pain or dizziness.  Objective   Blood pressure (!) 84/52, pulse (!) 46, temperature 97.8 F (36.6 C), temperature source Oral, resp. rate 14, height 5\' 7"  (1.702 m), weight 58 kg, SpO2 98 %.        Intake/Output Summary (Last 24 hours) at 03/06/2021 1020 Last data filed at 03/06/2021 0900 Gross per 24 hour  Intake 195.02  ml  Output 450 ml  Net -254.98 ml   Filed Weights   03/05/21 0546 03/06/21 0500  Weight: 63.5 kg 58 kg    Examination: Constitutional: elderly man in no acute distress  Eyes: EOMI, tracking Ears, nose, mouth, and throat: MMM, trachea midline, +temporal wasting Cardiovascular: Irregular, +SEM Respiratory: clear, no accessory muscle use Gastrointestinal: soft, +BS Skin: age related changes MSK: L BKA Neurologic: nonfocal Psychiatric: RASS 0   Labs/imaging that I have  personally reviewed  (right click and "Reselect all SmartList Selections" daily)  BNP up BUN/Cr stable  Resolved Hospital Problem list     Assessment & Plan:  Cardiogenic shock  due to Symptomatic Bradycardia/third degree HB- thought related to exaggerated response to beta blocker Has known h/o Combined HF (EF 15-20%) Severe CAD (3-Vessel dz 2019). W/ severe PAD and prior left BKA (2019) Chronic atrial fib. (CHADSVasc 8, Only on asa as has chronic thrombocytopenia)  COPD not in flare Acute Metabolic encephalopathy 2/2 shock/symptomatic bradycardia AKI-->suspect from hypoperfusion superimposed on CKD stage 3.  Diabetes w/ hyperglycemia Chronic thrombocytopenia   Off vent  Overall improving now that beta blocker is getting out of system.  Unfortunately going hypotensive and bradycardic off levophed, will reinitiate and use isoproterenol if run into issues.  34 minutes cc time  Erskine Emery MD PCCM

## 2021-03-06 NOTE — TOC Progression Note (Addendum)
Transition of Care [facility]) - Progression Note    Patient Details  Name: Arsalan Brisbin MRN: [id number] Date of Birth: 1946/04/06  Transition of Care [facility] CM/SW [place], [place] Phone Number: 03/06/2021, 1:56 PM  Clinical Narrative:    CSW followed up with pt's daughter in reference to questions, pt's daughter states the family would like to speak with someone in reference to pt's DNR and goals of care. CSW advised this conversation would best be had with the Palliative team. Pt's daughter states pt is from [facility] (SNF) in [place], [place] and was here visiting pt's son. At this time the plan is to return to SNF. CSW notified MD.         Expected Discharge Plan and Services                                                 Social Determinants of Health (SDOH) Interventions    Readmission Risk Interventions No flowsheet data found.

## 2021-03-06 NOTE — TOC Initial Note (Signed)
Transition of Care [facility]) - Initial/Assessment Note    Patient Details  Name: Derek Hinton MRN: [id number] Date of Birth: [birth year]  Transition of [facility]) CM/SW Contact:    [address]ta [address], [place] Phone Number: 03/06/2021, 1:18 PM  Clinical Narrative:                 CSW received a call from RN stating that pt's son had "social work" questions, CSW tried to call pt's son back, had to leave a vm.         Patient Goals and CMS Choice        Expected Discharge Plan and Services                                                Prior Living Arrangements/Services                       Activities of Daily Living      Permission Sought/Granted                  Emotional Assessment              Admission diagnosis:  Acute respiratory failure ([place]) [J96.00] Respiratory arrest ([place]) [R09.2] AV block, 3rd degree ([place]) [I44.2] Patient Active Problem List   Diagnosis Date Noted  . Respiratory arrest ([place])   . Acute respiratory failure ([place]) 03/05/2021  . AV block, 3rd degree (HCC)   . Cardiogenic shock ([place])   . Left below-knee amputee ([place]) 06/12/2019  . Pressure injury of skin 07/21/2018  . Coronary artery disease involving native coronary artery of native heart without angina pectoris   . Chronic combined systolic and diastolic heart failure ([place])   . Malnutrition of moderate degree 07/18/2018  . Acute encephalopathy 07/16/2018  . Lower limb ischemia 07/16/2018  . PVD (peripheral vascular disease) ([place]) 07/16/2018  . COPD (chronic obstructive pulmonary disease) ([place]) 07/16/2018  . Essential hypertension 07/16/2018  . Type 2 diabetes mellitus with peripheral vascular disease ([place]) 07/16/2018  . Persistent atrial fibrillation ([place]) 07/16/2018   PCP:  Guadalupe Maple, MD Pharmacy:   [place] [place] - [id number] [place] [address] Phone: [phone] Fax: [phone]     Social  Determinants of Health (SDOH) Interventions    Readmission Risk Interventions No flowsheet data found.

## 2021-03-06 NOTE — Progress Notes (Signed)
Progress Note  Patient Name: Derek Hinton Date of Encounter: 03/06/2021  [facility] Cardiologist: Sanda Klein, MD  Subjective   Awake, alert, feels well and is "ready to walk around". Off beta blockers, now also off pressors. AFib 70-80s, frequent monomorphic PVCs.  Inpatient Medications    Scheduled Meds: . aspirin  81 mg Per Tube Daily  . atorvastatin  40 mg Per Tube Daily  . heparin injection (subcutaneous)  5,000 Units Subcutaneous Q8H  . insulin aspart  0-20 Units Subcutaneous Q4H  . ipratropium-albuterol  3 mL Nebulization BID  . pantoprazole sodium  40 mg Per Tube Q1200   Continuous Infusions: . sodium chloride 10 mL/hr at 03/06/21 0500  . DOPamine Stopped (03/05/21 2106)  . lactated ringers    . norepinephrine (LEVOPHED) Adult infusion 4 mcg/min (03/06/21 0500)   PRN Meds: docusate sodium, polyethylene glycol   Vital Signs    Vitals:   03/06/21 0600 03/06/21 0700 03/06/21 0715 03/06/21 0745  BP: (!) 100/54 (!) 80/53 (!) 130/94 115/64  Pulse: 64 73 92 87  Resp: 13 13 20 12   Temp:      TempSrc:      SpO2: 100% 99% 99% 100%  Weight:      Height:        Intake/Output Summary (Last 24 hours) at 03/06/2021 0800 Last data filed at 03/06/2021 0500 Gross per 24 hour  Intake 128.11 ml  Output 450 ml  Net -321.89 ml   Last 3 Weights 03/06/2021 03/05/2021 07/20/2020  Weight (lbs) 127 lb 13.9 oz 140 lb 140 lb  Weight (kg) 58 kg 63.504 kg 63.504 kg      Telemetry    AFib 70-80 - Personally Reviewed  ECG    AFib, PRWP, no ischemic changes - Personally Reviewed  Physical Exam  Smiling, breathing comfortably lying at 10 deg HOB GEN: No acute distress.   Neck: No JVD Cardiac: irregular, no murmurs, rubs, or gallops.  Respiratory: Clear to auscultation bilaterally. GI: Soft, nontender, non-distended  MS: No edema; L BKA Neuro:  Nonfocal  Psych: Normal affect   Labs    High Sensitivity Troponin:   Recent Labs  Lab 03/05/21 0551  03/05/21 0807  TROPONINIHS 46* 79*      Chemistry Recent Labs  Lab 03/05/21 0551 03/05/21 0621 03/05/21 0627 03/06/21 0013  NA 138 140 140 140  K 5.0 4.7 5.0 4.5  CL 103  --  105 105  CO2 22  --   --  26  GLUCOSE 366*  --  353* 99  BUN 54*  --  56* 57*  CREATININE 1.70*  --  1.60* 1.56*  CALCIUM 8.4*  --   --  9.0  PROT 6.7  --   --  6.4*  ALBUMIN 3.6  --   --  3.6  AST 77*  --   --  76*  ALT 56*  --   --  58*  ALKPHOS 93  --   --  98  BILITOT 1.0  --   --  0.6  GFRNONAA 41*  --   --  46*  ANIONGAP 13  --   --  9     Hematology Recent Labs  Lab 03/05/21 0551 03/05/21 0621 03/05/21 0627 03/06/21 0013  WBC 7.5  --   --  9.0  RBC 3.41*  --   --  3.42*  HGB 10.7* 10.5* 10.5* 10.6*  HCT 34.6* 31.0* 31.0* 33.4*  MCV 101.5*  --   --  97.7  MCH 31.4  --   --  31.0  MCHC 30.9  --   --  31.7  RDW 15.0  --   --  15.1  PLT 80*  --   --  124*    BNP Recent Labs  Lab 03/05/21 0957 03/06/21 0013  BNP 946.0* 2,293.2*     DDimer No results for input(s): DDIMER in the last 168 hours.   Radiology    DG Chest Portable 1 View  Result Date: 03/05/2021 CLINICAL DATA:  Intubation EXAM: PORTABLE CHEST 1 VIEW COMPARISON:  07/06/2020 FINDINGS: Cardiomegaly. Small left pleural effusion. Volume loss in the left chest. Endotracheal tube with tip at the clavicular heads. Extensive artifact from EKG leads. Remote right rib fracture or IMPRESSION: 1. Unremarkable endotracheal tube. 2. Chronic left pleural effusion or pleural thickening with volume loss. No acute finding when compared to 2021 comparison. Electronically Signed   By: Monte Fantasia M.D.   On: 03/05/2021 06:34   ECHOCARDIOGRAM COMPLETE  Result Date: 03/05/2021    ECHOCARDIOGRAM REPORT   Patient Name:   Derek Hinton Date of Exam: 03/05/2021 Medical Rec #:  [id number]        Height:       67.0 in Accession #:    [id number]       Weight:       140.0 lb Date of Birth:  May 09, 1946         BSA:          1.738 m Patient Age:     75 years         BP:           137/63 mmHg Patient Gender: M                HR:           69 bpm. Exam Location:  Inpatient Procedure: 2D Echo, Cardiac Doppler and Color Doppler Indications:    Acure respiratory distress  History:        Patient has prior history of Echocardiogram examinations, most                 recent 07/17/2018. CHF, COPD and Stroke, Arrythmias:Atrial                 Fibrillation and Bradycardia, Signs/Symptoms:Chest Pain and                 Syncope; Risk Factors:Hypertension and Diabetes. Cardiogenic                 shock.  Sonographer:    Dustin Flock Referring Phys: [id number] MARGO T LANNAN IMPRESSIONS  1. Left ventricular ejection fraction, by estimation, is <20%. The left ventricle has severely decreased function. The left ventricle demonstrates regional wall motion abnormalities (see scoring diagram/findings for description). The left ventricular internal cavity size was mildly dilated. Left ventricular diastolic function could not be evaluated.  2. Right ventricular systolic function is low normal. The right ventricular size is normal. There is moderately elevated pulmonary artery systolic pressure.  3. Left atrial size was severely dilated.  4. Right atrial size was severely dilated.  5. The mitral valve is degenerative. Trivial mitral valve regurgitation. No evidence of mitral stenosis. Moderate mitral annular calcification.  6. Tricuspid valve regurgitation is moderate to severe.  7. The aortic valve is tricuspid. There is mild calcification of the aortic valve. There is mild thickening of the aortic valve. Aortic valve regurgitation is trivial. Mild aortic valve sclerosis  is present, with no evidence of aortic valve stenosis.  8. The inferior vena cava is dilated in size with >50% respiratory variability, suggesting right atrial pressure of 8 mmHg. Comparison(s): Changes from prior study are noted. Apical segments now akinetic. Conclusion(s)/Recommendation(s): Severely reduced  LV EF as noted, with biatrial enlargement. FINDINGS  Left Ventricle: Left ventricular ejection fraction, by estimation, is <20%. The left ventricle has severely decreased function. The left ventricle demonstrates regional wall motion abnormalities. The left ventricular internal cavity size was mildly dilated. There is borderline left ventricular hypertrophy. Left ventricular diastolic function could not be evaluated due to atrial fibrillation. Left ventricular diastolic function could not be evaluated.  LV Wall Scoring: The mid and distal lateral wall, mid and distal anterior septum, and entire apex are akinetic. The anterior wall, antero-lateral wall, inferior wall, basal anteroseptal segment, basal inferolateral segment, mid inferoseptal segment, and basal inferoseptal segment are hypokinetic. Right Ventricle: The right ventricular size is normal. Right vetricular wall thickness was not well visualized. Right ventricular systolic function is low normal. There is moderately elevated pulmonary artery systolic pressure. The tricuspid regurgitant velocity is 3.22 m/s, and with an assumed right atrial pressure of 8 mmHg, the estimated right ventricular systolic pressure is 93.7 mmHg. Left Atrium: Left atrial size was severely dilated. Right Atrium: Right atrial size was severely dilated. Pericardium: There is no evidence of pericardial effusion. Mitral Valve: The mitral valve is degenerative in appearance. Moderate mitral annular calcification. Trivial mitral valve regurgitation. No evidence of mitral valve stenosis. Tricuspid Valve: The tricuspid valve is normal in structure. Tricuspid valve regurgitation is moderate to severe. Aortic Valve: The aortic valve is tricuspid. There is mild calcification of the aortic valve. There is mild thickening of the aortic valve. Aortic valve regurgitation is trivial. Mild aortic valve sclerosis is present, with no evidence of aortic valve stenosis. Pulmonic Valve: The pulmonic  valve was grossly normal. Pulmonic valve regurgitation is trivial. Aorta: The aortic root and ascending aorta are structurally normal, with no evidence of dilitation. Venous: The inferior vena cava is dilated in size with greater than 50% respiratory variability, suggesting right atrial pressure of 8 mmHg. IAS/Shunts: The atrial septum is grossly normal.  LEFT VENTRICLE PLAX 2D LVIDd:         5.10 cm  Diastology LVIDs:         4.50 cm  LV e' medial:    5.87 cm/s LV PW:         1.10 cm  LV E/e' medial:  10.2 LV IVS:        1.20 cm  LV e' lateral:   7.29 cm/s LVOT diam:     2.40 cm  LV E/e' lateral: 8.2 LV SV:         62 LV SV Index:   36 LVOT Area:     4.52 cm  RIGHT VENTRICLE RV Basal diam:  3.40 cm RV S prime:     7.83 cm/s TAPSE (M-mode): 1.9 cm LEFT ATRIUM             Index       RIGHT ATRIUM           Index LA diam:        4.50 cm 2.59 cm/m  RA Area:     23.30 cm LA Vol (A2C):   89.3 ml 51.39 ml/m RA Volume:   74.90 ml  43.10 ml/m LA Vol (A4C):   68.3 ml 39.30 ml/m LA Biplane Vol: 80.7 ml 46.44 ml/m  AORTIC VALVE LVOT Vmax:   66.60 cm/s LVOT Vmean:  41.700 cm/s LVOT VTI:    0.138 m  AORTA Ao Root diam: 3.30 cm MITRAL VALVE               TRICUSPID VALVE MV Area (PHT): 5.97 cm    TR Peak grad:   41.5 mmHg MV Decel Time: 127 msec    TR Vmax:        322.00 cm/s MV E velocity: 59.60 cm/s MV A velocity: 31.30 cm/s  SHUNTS MV E/A ratio:  1.90        Systemic VTI:  0.14 m                            Systemic Diam: 2.40 cm Buford Dresser MD Electronically signed by Buford Dresser MD Signature Date/Time: 03/05/2021/5:31:49 PM    Final     Cardiac Studies   Echo above, unchanged  Patient Profile     75 y.o. male with chronic systolic and diastolic heart failure, ischemic cardiomyopathy with EF 15-20%, CAD with 3V disease, not a candidate for CABG, PAD s/p R iliac PTA and L BKA, persistent atrial fibrillation not on anticoagulation, ischemic CVA, type II diabetes, COPD admitted with severe  symptomatic bradycardia (AFib with slow VR).  Assessment & Plan    1. AFib w slow VR: beta blocker stopped. His HR is now in the 70s and he has evidence of improving cardiac output. No imminent need for pacemaker.  Not on anticoagulation due to severe bleeding while on clopidogrel for peripheral stent. 2. Hypotension/shock: transient, bradycardia related on background of severe ischemic CMP. 3. AKI: improving, due to reduced cardiac output with bradycardia.  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 4. CAD: had some chest discomfort overnight, currently asymptomatic. Not a candidate for revascularization. On statin. 5. PVCs: frequent, monomorphic, but no complex arrhythmia.  6. CHF: appears euvolemic clinically and on CXR, compensated. BNP is not relevant (on Entresto, should check proBNP). 7. PAD: s/p L BKA and R arterial stent. 8. DM: fair control.  9. DNR  OK to transfer to telemetry if PCCM agrees.     For questions or updates, please contact [place] Please consult [email or web address] for contact info under        Signed, Sanda Klein, MD  03/06/2021, 8:00 AM

## 2021-03-07 DIAGNOSIS — I25118 Atherosclerotic heart disease of native coronary artery with other forms of angina pectoris: Secondary | ICD-10-CM | POA: Diagnosis not present

## 2021-03-07 DIAGNOSIS — Z515 Encounter for palliative care: Secondary | ICD-10-CM | POA: Diagnosis not present

## 2021-03-07 DIAGNOSIS — R627 Adult failure to thrive: Secondary | ICD-10-CM

## 2021-03-07 DIAGNOSIS — R092 Respiratory arrest: Secondary | ICD-10-CM | POA: Diagnosis not present

## 2021-03-07 DIAGNOSIS — Z66 Do not resuscitate: Secondary | ICD-10-CM | POA: Diagnosis not present

## 2021-03-07 DIAGNOSIS — I4891 Unspecified atrial fibrillation: Secondary | ICD-10-CM | POA: Diagnosis not present

## 2021-03-07 DIAGNOSIS — I442 Atrioventricular block, complete: Secondary | ICD-10-CM | POA: Diagnosis not present

## 2021-03-07 DIAGNOSIS — R570 Cardiogenic shock: Secondary | ICD-10-CM | POA: Diagnosis not present

## 2021-03-07 DIAGNOSIS — J9601 Acute respiratory failure with hypoxia: Secondary | ICD-10-CM | POA: Diagnosis not present

## 2021-03-07 DIAGNOSIS — N179 Acute kidney failure, unspecified: Secondary | ICD-10-CM | POA: Diagnosis not present

## 2021-03-07 LAB — HEMOGLOBIN A1C
Hgb A1c MFr Bld: 7.2 % — ABNORMAL HIGH (ref 4.8–5.6)
Hgb A1c MFr Bld: 7.2 % — ABNORMAL HIGH (ref 4.8–5.6)
Mean Plasma Glucose: 160 mg/dL
Mean Plasma Glucose: 160 mg/dL

## 2021-03-07 LAB — GLUCOSE, CAPILLARY
Glucose-Capillary: 118 mg/dL — ABNORMAL HIGH (ref 70–99)
Glucose-Capillary: 172 mg/dL — ABNORMAL HIGH (ref 70–99)
Glucose-Capillary: 201 mg/dL — ABNORMAL HIGH (ref 70–99)
Glucose-Capillary: 272 mg/dL — ABNORMAL HIGH (ref 70–99)
Glucose-Capillary: 300 mg/dL — ABNORMAL HIGH (ref 70–99)
Glucose-Capillary: 303 mg/dL — ABNORMAL HIGH (ref 70–99)
Glucose-Capillary: 350 mg/dL — ABNORMAL HIGH (ref 70–99)
Glucose-Capillary: 96 mg/dL (ref 70–99)

## 2021-03-07 LAB — CALCIUM, IONIZED: Calcium, Ionized, Serum: 4.8 mg/dL (ref 4.5–5.6)

## 2021-03-07 MED ORDER — ALPRAZOLAM 0.25 MG PO TABS
0.2500 mg | ORAL_TABLET | Freq: Three times a day (TID) | ORAL | Status: DC | PRN
  Administered 2021-03-07: 0.25 mg via ORAL
  Filled 2021-03-07: qty 1

## 2021-03-07 MED ORDER — DEXTROSE 50 % IV SOLN: 25.0000 g | INTRAVENOUS | Status: AC

## 2021-03-07 MED ORDER — DEXTROSE 10 % IV SOLN: INTRAVENOUS | Status: DC

## 2021-03-07 MED ORDER — DOPAMINE-DEXTROSE 3.2-5 MG/ML-% IV SOLN: 5.0000 ug/kg/min | INTRAVENOUS | Status: DC

## 2021-03-07 MED ORDER — DOPAMINE-DEXTROSE 3.2-5 MG/ML-% IV SOLN
INTRAVENOUS | Status: AC
  Administered 2021-03-08: 5 ug/kg/min via INTRAVENOUS
  Filled 2021-03-07: qty 250

## 2021-03-07 NOTE — Consult Note (Signed)
Consultation Note Date: 03/07/2021   Patient Name: Derek Hinton  DOB: October 11, 1945  MRN: [id number]  Age / Sex: 75 y.o., male  PCP: Guadalupe Maple, MD Referring Physician: Sanda Klein, MD  Reason for Consultation: Establishing goals of care  HPI/Patient Profile: 75 y.o. male  with past medical history of chronic combined CHF, ICM with EF 15-20%, CAD with 3V disease not CABG candidate, PAD s/p R iliac PTA and L BKA, persistent AF not on [place], ischemic CVA, DM2, and COPD  admitted on 03/05/2021 with severe symptomatic bradycardia requiring temporary intubation, external pacing. Now extubated with stable vital signs.  The patient lives at [facility] (SNF) in [place], [place]. He was visiting his son and became weak and had to come to the hospital. He states his facility has DNR paperwork on file and he never wanted to be coded. We discussed completing DNR and MOST paperwork here in case he visits his son again and has to come to the hospital, they will be on file here.   He states he's ok with hospital and ICU admission, IVF, and antibiotics (especially for a temporary situation). However he states "I don't want to be kept alive on any damn machines." He clarified no code (DNR) and no tube feeding/tubes.  Clinical Assessment and Goals of Care:  This NP Walden Field reviewed medical records, received report from team, assessed the patient and then meet at the patient's bedside  to discuss diagnosis, prognosis, GOC, EOL wishes disposition and options.   Concept of Palliative Care was introduced as specialized medical care for people and their families living with serious illness.  If focuses on providing relief from the symptoms and stress of a serious illness.  The goal is to improve quality of life for both the patient and the family. Values and goals of care important to patient and family were attempted to be  elicited.  Created space and opportunity for patient  and family to explore thoughts and feelings regarding current medical situation.   A discussion was had today regarding advanced directives. Concepts specific to code status, artifical feeding and hydration, continued IV antibiotics and rehospitalization was had.  The difference between a aggressive medical intervention path  and a palliative comfort care path for this patient at this time was had.   The patient states he wants to be a DNR and that there is a DNR and not kept alive on machines.  MOST form was completed based on the patients verbalized wishes and care goals.   Natural trajectory and expectations at EOL were discussed.  Questions and concerns addressed.  Patient  encouraged to call with questions or concerns.     PMT will continue to support holistically.    Primary Decision Maker:  PATIENT    SUMMARY OF RECOMMENDATIONS    DNR on file  MOST form completed  Continue with medical care to "treat the treatable" including PPM tomorrow  Plan discharge back to SNF facility when medically stable for discharge  Code Status/Advance Care Planning:  DNR  Palliative Prophylaxis:   Frequent Pain Assessment  Additional Recommendations (Limitations, Scope, Preferences):  No Artificial Feeding  Psycho-social/Spiritual:   Desire for further Chaplaincy support: no  Prognosis:   Unable to determine  Discharge Planning: To Be Determined      Primary Diagnoses: Present on Admission: . Acute respiratory failure ([place])   I have reviewed the medical record, interviewed the patient and family, and examined the patient. The following aspects are pertinent.  Past Medical History:  Diagnosis Date  . COPD (chronic obstructive pulmonary disease) (HCC)    not on home O2  . Diabetes mellitus without complication ([place])   . Hypertension   . PVD (peripheral vascular disease) ([place])    s/p L BKA   Social History    Socioeconomic History  . Marital status: Divorced    Spouse name: Not on file  . Number of children: Not on file  . Years of education: Not on file  . Highest education level: Not on file  Occupational History  . Occupation: retired  Tobacco Use  . Smoking status: Former Smoker    Packs/day: 1.00    Years: 60.00    Pack years: 60.00    Quit date: 12/2017    Years since quitting: 3.2  . Smokeless tobacco: Never Used  Substance and Sexual Activity  . Alcohol use: Not Currently  . Drug use: Never  . Sexual activity: Not on file  Other Topics Concern  . Not on file  Social History Narrative  . Not on file   Social Determinants of Health   Financial Resource Strain: Not on file  Food Insecurity: Not on file  Transportation Needs: Not on file  Physical Activity: Not on file  Stress: Not on file  Social Connections: Not on file   Family History  Problem Relation Age of Onset  . Diabetes Father    Scheduled Meds: . aspirin  81 mg Oral Daily  . atorvastatin  40 mg Oral Daily  . Chlorhexidine Gluconate Cloth  6 each Topical Daily  . dextrose  25 g Intravenous STAT  . heparin injection (subcutaneous)  5,000 Units Subcutaneous Q8H  . insulin aspart  0-20 Units Subcutaneous Q4H  . pantoprazole  40 mg Oral Daily   Continuous Infusions: . sodium chloride Stopped (03/06/21 1424)  . isoproterenol (ISUPREL) infusion Stopped (03/06/21 2102)  . lactated ringers     PRN Meds:.docusate sodium, ipratropium-albuterol, polyethylene glycol Medications Prior to Admission:  Prior to Admission medications   Medication Sig Start Date End Date Taking? Authorizing Provider  aspirin 81 MG tablet Take 81 mg by mouth daily.   Yes [provider]  atorvastatin (LIPITOR) 40 MG tablet Take 1 tablet (40 mg total) by mouth daily. 07/24/18  Yes Alma Friendly, MD  FARXIGA 10 MG TABS tablet Take 10 mg by mouth daily. 05/22/19  Yes [provider]  furosemide (LASIX) 40 MG  tablet Take 80mg  in the morning and 40mg  in the pm Patient taking differently: Take 40 mg by mouth daily. 08/21/18  Yes Almyra Deforest, PA  glipiZIDE (GLUCOTROL) 5 MG tablet Take 5 mg by mouth 2 (two) times daily. 02/27/21  Yes [provider]  iron polysaccharides (NIFEREX) 150 MG capsule Take 150 mg by mouth 2 (two) times daily.   Yes [provider]  metFORMIN (GLUCOPHAGE) 1000 MG tablet Take 1,000 mg by mouth 2 (two) times daily. 12/18/17  Yes [provider]  metoprolol succinate (TOPROL-XL) 50 MG 24 hr tablet Take 1 tablet (50 mg total) by mouth daily. Take with or immediately following a meal. 08/21/18 07/20/20 Yes Meng, [place], PA  Multiple Vitamin (MULTIVITAMIN WITH MINERALS) TABS tablet Take 1 tablet by mouth daily. 07/25/18  Yes Alma Friendly, MD  sacubitril-valsartan (ENTRESTO) 49-51 MG Take 1 tablet by mouth 2 (two) times daily. 08/12/18  Yes Almyra Deforest, PA  sodium bicarbonate 650 MG tablet Take 650 mg by mouth 2 (two) times daily. 07/09/20  Yes [provider]  BREO ELLIPTA 100-25 MCG/INH AEPB Inhale 2 puffs into the lungs as directed. 08/28/18   [provider]   Allergies  Allergen Reactions  . Penicillins Other (See Comments)    Pt unsure, states he "didn't feel good" Patient states it made him "feel Funny"    Review of Systems  Physical Exam  Vital Signs: BP 129/66   Pulse 65   Temp 97.9 F (36.6 C)   Resp (!) 21   Ht 5\' 7"  (1.702 m)   Wt 59.4 kg   SpO2 97%   BMI 20.51 kg/m  Pain Scale: 0-10   Pain Score: 0-No pain   SpO2: SpO2: 97 % O2 Device:SpO2: 97 % O2 Flow Rate: .O2 Flow Rate (L/min): 2 L/min  IO: Intake/output summary:   Intake/Output Summary (Last 24 hours) at 03/07/2021 1507 Last data filed at 03/07/2021 0700 Gross per 24 hour  Intake 9.85 ml  Output 460 ml  Net -450.15 ml    LBM: Last BM Date: 03/05/21 Baseline Weight: Weight: 63.5 kg (from Nov 2021 records) Most recent weight: Weight: 59.4 kg      Palliative Assessment/Data: 60-70%   Discussed with bedside RN  Time In: 2:00 pm Time Out: 3:10 pm Time Total: 70 minutes Greater than 50%  of this time was spent counseling and coordinating care related to the above assessment and plan.  Signed by: Walden Field, NP Wadie Lessen, NP   This NP Wadie Lessen NP provided supervisison and I  agree with above assessment and plan.    Please contact Palliative Medicine Team phone at [phone] for questions and concerns.  For individual provider: See Shea Evans

## 2021-03-07 NOTE — [place] FL2 (Signed)
[place] MEDICAID FL2 LEVEL OF CARE SCREENING TOOL     IDENTIFICATION  Patient Name: Derek Hinton Birthdate: May 25, 1946 Sex: male Admission Date (Current Location): 03/05/2021  [facility] and [place] Number:  Herbalist and Address:  [facility] [place], [place] [address], [place], [place] [address]      Provider Number: [id number]  Attending Physician Name and Address:  Sanda Klein, MD  Relative Name and Phone Number:       Current Level of Care: Hospital Recommended Level of Care: [place] Prior Approval Number:    Date Approved/Denied:   PASRR Number: [id number]  Discharge Plan: SNF    Current Diagnoses: Patient Active Problem List   Diagnosis Date Noted  . Respiratory arrest ([place])   . Acute respiratory failure ([place]) 03/05/2021  . AV block, 3rd degree (HCC)   . Cardiogenic shock ([place])   . Left below-knee amputee ([place]) 06/12/2019  . Pressure injury of skin 07/21/2018  . Coronary artery disease involving native coronary artery of native heart without angina pectoris   . Chronic combined systolic and diastolic heart failure ([place])   . Malnutrition of moderate degree 07/18/2018  . Acute encephalopathy 07/16/2018  . Lower limb ischemia 07/16/2018  . PVD (peripheral vascular disease) ([place]) 07/16/2018  . COPD (chronic obstructive pulmonary disease) ([place]) 07/16/2018  . Essential hypertension 07/16/2018  . Type 2 diabetes mellitus with peripheral vascular disease ([place]) 07/16/2018  . Persistent atrial fibrillation ([place]) 07/16/2018    Orientation RESPIRATION BLADDER Height & Weight     Self,Time,Situation,Place  Normal (see d/c summary) Continent Weight: 130 lb 15.3 oz (59.4 kg) Height:  5\' 7"  (170.2 cm)  BEHAVIORAL SYMPTOMS/MOOD NEUROLOGICAL BOWEL NUTRITION STATUS      Continent Diet (see d/c summary)  AMBULATORY STATUS COMMUNICATION OF NEEDS Skin   Extensive Assist Verbally Normal                       Personal  Care Assistance Level of Assistance  Bathing,Feeding,Dressing Bathing Assistance: Maximum assistance Feeding assistance: Independent Dressing Assistance: Limited assistance     Functional Limitations Info  Sight,Hearing,Speech Sight Info: Adequate Hearing Info: Adequate Speech Info: Adequate    SPECIAL CARE FACTORS FREQUENCY                       Contractures Contractures Info: Not present    Additional Factors Info  Code Status,Allergies Code Status Info: DNR Allergies Info: Penicillins           Current Medications (03/07/2021):  This is the current hospital active medication list Current Facility-Administered Medications  Medication Dose Route Frequency Provider Last Rate Last Admin  . 0.9 %  sodium chloride infusion  250 mL Intravenous Continuous Elmer Sow, MD   Stopped at 03/06/21 1424  . aspirin chewable tablet 81 mg  81 mg Oral Daily Candee Furbish, MD   81 mg at 03/07/21 0847  . atorvastatin (LIPITOR) tablet 40 mg  40 mg Oral Daily Candee Furbish, MD   40 mg at 03/07/21 0847  . Chlorhexidine Gluconate Cloth 2 % PADS 6 each  6 each Topical Daily Candee Furbish, MD      . dextrose 50 % solution 25 g  25 g Intravenous STAT Anders Simmonds, MD      . docusate sodium (COLACE) capsule 100 mg  100 mg Oral BID PRN Jacalyn Lefevre, MD      . heparin  injection 5,000 Units  5,000 Units Subcutaneous Q8H Erick Colace, NP   5,000 Units at 03/07/21 [phone]  . insulin aspart (novoLOG) injection 0-20 Units  0-20 Units Subcutaneous Q4H Erick Colace, NP   4 Units at 03/07/21 [phone]  . ipratropium-albuterol (DUONEB) 0.5-2.5 (3) MG/3ML nebulizer solution 3 mL  3 mL Nebulization Q6H PRN Candee Furbish, MD      . isoproterenol (ISUPREL) 1 mg in dextrose 5 % 250 mL (0.004 mg/mL) infusion  2-20 mcg/min Intravenous Titrated Candee Furbish, MD   Stopped at 03/06/21 2102  . lactated ringers infusion   Intravenous Continuous Anders Simmonds, MD      . pantoprazole (PROTONIX)  EC tablet 40 mg  40 mg Oral Daily Candee Furbish, MD   40 mg at 03/07/21 0847  . polyethylene glycol (MIRALAX / GLYCOLAX) packet 17 g  17 g Oral Daily PRN Jacalyn Lefevre, MD         Discharge Medications: Please see discharge summary for a list of discharge medications.  Relevant Imaging Results:  Relevant Lab Results:   Additional Information SSN: Maribel [place], [place]

## 2021-03-07 NOTE — Progress Notes (Addendum)
   NAME:  Derek Hinton, MRN:  [id number], DOB:  [birth year], LOS: 2 ADMISSION DATE:  03/05/2021, CONSULTATION DATE:  03/07/21 REFERRING MD:  EDP, CHIEF COMPLAINT:  bradycardia   History of Present Illness:   75 y.o. M with PMH of HFrEF 20%, COPD, DM, HTN, remote squamous cell carcinoma of skin (removed), prior CVA, and PVD who was brought in after calling EMS reporting feeling "dizzy", having chest pain and  near syncopal event. On arrival found to be bradycardic (HR 30s 3rd degree AVB), he was being paced at the time of arrival and required bagging.  After ED presentation he reportedly "flatlined" and received two precordial thumps before before being externally paced and placed on Dopamine and intubated.  Initial rhythm was heart block, unclear what type.  After being starting Dopamine, pt regained pulses and was intermittently in atrial fib.     Pertinent  Medical History   has a past medical history of COPD (chronic obstructive pulmonary disease) ([place]), Diabetes mellitus without complication ([place]), Hypertension, and PVD (peripheral vascular disease) ([place]).  * history of prior CVA *squamous Cell carcinoma of skin (remote and removed)  Significant Hospital Events: Including procedures, antibiotic start and stop dates in addition to other pertinent events   . 5/29 Presented to ED with bradycardia (3rd degree HB), after calling EMS for CP and dizziness. Lost mental status on way to hospital. Required assisted ventilation and external pacemaker. Intubated on arrival by EDP. Briefly started on Dopamine.   Interim History / Subjective:  No events. Wants to go home.  Objective   Blood pressure (!) 117/48, pulse 80, temperature 98.2 F (36.8 C), temperature source Oral, resp. rate (!) 22, height 5\' 7"  (1.702 m), weight 59.4 kg, SpO2 94 %.        Intake/Output Summary (Last 24 hours) at 03/07/2021 2951 Last data filed at 03/07/2021 0700 Gross per 24 hour  Intake 131.67 ml  Output 685 ml  Net  -553.33 ml   Filed Weights   03/05/21 0546 03/06/21 0500 03/07/21 0630  Weight: 63.5 kg 58 kg 59.4 kg    Examination: Constitutional: chronically ill man in NAD  Eyes: glasses on, tracking Ears, nose, mouth, and throat: MMM poor dentition Cardiovascular: Irregular, ext warm Respiratory: clear, no wheezing or accessory muscle use Gastrointestinal: soft, +BS Skin: No rashes, normal turgor Neurologic: moves all 4 ext (left BKA) Psychiatric: RASS 0, good insight   Labs/imaging that I have  personally reviewed  (right click and "Reselect all SmartList Selections" daily)  No new labs  Resolved Hospital Problem list     Assessment & Plan:  Cardiogenic shock  due to Symptomatic Bradycardia/third degree HB- thought related to exaggerated response to beta blocker Has known h/o Combined HF (EF 15-20%) Severe CAD (3-Vessel dz 2019). W/ severe PAD and prior left BKA (2019) Chronic atrial fib. (CHADSVasc 8, Only on asa as has chronic thrombocytopenia)  COPD not in flare Acute Metabolic encephalopathy 2/2 shock/symptomatic bradycardia AKI-->suspect from hypoperfusion superimposed on CKD stage 3.  Diabetes w/ hyperglycemia Chronic thrombocytopenia   Plans for potential pacemaker As no ongoing ICU needs, will transfer care to cardiology service, PCCM available PRN  Erskine Emery MD PCCM

## 2021-03-07 NOTE — Progress Notes (Deleted)
[facility] Progress Note Patient Name: Derek Hinton DOB: 1946/07/24 MRN: [id number]   Date of Service  03/07/2021  HPI/Events of Note  Hypoglycemia - Blood glucose  < 10.    eICU Interventions  Plan: 1. Hypoglycemia protocol. 2. D10W to run IV at 50 mL/hour.     Intervention Category Major Interventions: Other:  Lysle Dingwall 03/07/2021, 5:15 AM

## 2021-03-07 NOTE — Progress Notes (Signed)
Inpatient Diabetes Program Recommendations  AACE/ADA: New Consensus Statement on Inpatient Glycemic Control (2015)  Target Ranges:  Prepandial:   less than 140 mg/dL      Peak postprandial:   less than 180 mg/dL (1-2 hours)      Critically ill patients:  140 - 180 mg/dL   Lab Results  Component Value Date   GLUCAP 350 (H) 03/07/2021   HGBA1C 7.2 (H) 03/06/2021    Review of Glycemic Control Results for Derek Hinton, Derek Hinton (MRN [id number]) as of 03/07/2021 14:45  Ref. Range 03/06/2021 08:01 03/06/2021 11:26 03/06/2021 15:37 03/06/2021 19:31 03/07/2021 00:21 03/07/2021 03:30 03/07/2021 06:47 03/07/2021 08:10 03/07/2021 12:00  Glucose-Capillary Latest Ref Range: 70 - 99 mg/dL 232 (H) 253 (H) 240 (H) 201 (H) 96 118 (H) 172 (H) 272 (H) 350 (H)   Diabetes history: DM 2 Outpatient Diabetes medications:  Glucotrol 5 mg bid Metformin 1000 mg bid Farxiga 10 mg daily Current orders for Inpatient glycemic control:  Novolog resistant q 4 hours Inpatient Diabetes Program Recommendations:   While in the hospital and oral DM medications on hold, consider adding Lantus 10 units daily.  Also change Novolog correction to moderate tid with meals and HS (instead of Novolog resistant q 4 hours).   Thanks,  Adah Perl, RN, BC-ADM Inpatient Diabetes Coordinator Pager [phone] (8a-5p)

## 2021-03-07 NOTE — TOC Initial Note (Addendum)
Transition of Care [facility]) - Initial/Assessment Note    Patient Details  Name: Derek Hinton MRN: [id number] Date of Birth: 1946-02-14  Transition of Care [facility]) CM/SW Contact:    Trula Ore, [place] Phone Number: 03/07/2021, 3:03 PM  Clinical Narrative:                  CSW spoke with patient regarding DC plan. Patient confirmed he is from [facility]. Patients plan is to return when medically ready. Patient has received both Covid vaccines as well as booster. Patient had concerns about code status. Patients wants to make sure code status is correct. CSW informed MD and RN of patients concern. CSW will continue to follow and assist with discharge planning needs.   Expected Discharge Plan: Skilled Nursing Facility Barriers to Discharge: Continued Medical Work up   Patient Goals and CMS Choice Patient states their goals for this hospitalization and ongoing recovery are:: to go to SNF CMS Medicare.gov Compare Post Acute Care list provided to:: Patient Choice offered to / list presented to : Patient  Expected Discharge Plan and Services Expected Discharge Plan: [place] In-house Referral: Clinical Social Work     Living arrangements for the past 2 months: [place]                                      Prior Living Arrangements/Services Living arrangements for the past 2 months: [place] Lives with:: Self,Facility Resident Patient language and need for interpreter reviewed:: Yes Do you feel safe going back to the place where you live?: Yes (going back to longterm SNF)      Need for Family Participation in Patient Care: Yes (Comment) Care giver support system in place?: Yes (comment)   Criminal Activity/Legal Involvement Pertinent to Current Situation/Hospitalization: No - Comment as needed  Activities of Daily Living      Permission Sought/Granted Permission sought to share information with : Case  Manager,Family Chief Financial Officer Permission granted to share information with : Yes, Verbal Permission Granted  Share Information with NAME: [place] and [place] granted to share info w AGENCY: SNF  Permission granted to share info w Relationship: Son and Daughter  Permission granted to share info w Contact Information: [place] [phone] [place] [phone]  Emotional Assessment Appearance:: Appears stated age Attitude/Demeanor/Rapport: Gracious Affect (typically observed): Calm Orientation: : Oriented to Self,Oriented to Place,Oriented to  Time,Oriented to Situation Alcohol / Substance Use: Not Applicable Psych Involvement: No (comment)  Admission diagnosis:  Acute respiratory failure ([place]) [J96.00] Respiratory arrest ([place]) [R09.2] AV block, 3rd degree ([place]) [I44.2] Patient Active Problem List   Diagnosis Date Noted  . Respiratory arrest ([place])   . Acute respiratory failure ([place]) 03/05/2021  . AV block, 3rd degree (HCC)   . Cardiogenic shock ([place])   . Left below-knee amputee ([place]) 06/12/2019  . Pressure injury of skin 07/21/2018  . Coronary artery disease involving native coronary artery of native heart without angina pectoris   . Chronic combined systolic and diastolic heart failure ([place])   . Malnutrition of moderate degree 07/18/2018  . Acute encephalopathy 07/16/2018  . Lower limb ischemia 07/16/2018  . PVD (peripheral vascular disease) ([place]) 07/16/2018  . COPD (chronic obstructive pulmonary disease) ([place]) 07/16/2018  . Essential hypertension 07/16/2018  . Type 2 diabetes mellitus with peripheral vascular disease ([place]) 07/16/2018  . Persistent atrial fibrillation ([place]) 07/16/2018  PCP:  Guadalupe Maple, MD Pharmacy:   [facility], [place] - [id number] [place] [address] [place] [address] Phone: [phone] Fax: [phone]     Social Determinants of Health (SDOH) Interventions    Readmission Risk Interventions No  flowsheet data found.

## 2021-03-07 NOTE — Progress Notes (Signed)
Chaplain answered call from patient's nurse that he needed some support to help him calm down enough to relax and sleep. Chaplain listened to patient tell about his family and all the bumps in the road he had been through and had survived. But he said he was scared to death of his heart problems and being shocked to bring him back. He said he had rather died than to go through that and he does not want to go through that again. He was very nervous and said he had things going on in his head and he could not make them stop. He said he wanted to sleep but he couldn't. He said he was too scared. I reminded him that he is DNR and they would not be doing that again and he should not worry about that. He said he was not scared of dying and going to heaven but he was scared of what he was going to have to go through before he did die. Chaplain prayed for him and held his hand and eventually he said maybe he could rest. Chaplain told him that she was available all night if he needed her to return.    03/07/21 2200  Clinical Encounter Type  Visited With Patient  Visit Type Spiritual support  Referral From Nurse  Consult/Referral To Chaplain  Spiritual Encounters  Spiritual Needs Emotional;Prayer  Stress Factors  Patient Stress Factors Major life changes

## 2021-03-07 NOTE — Progress Notes (Signed)
Progress Note  Patient Name: Derek Hinton Date of Encounter: 03/07/2021  [facility] Cardiologist: Sanda Klein, MD   Subjective   Had several more episodes of bradycardia to the 30s, reporting symptoms of "feeling tired", but no angina/dyspnea or frank syncope. Continues to have frequent PVCs and occasional nonsustained slow wide complex rhythm.  Inpatient Medications    Scheduled Meds: . aspirin  81 mg Oral Daily  . atorvastatin  40 mg Oral Daily  . Chlorhexidine Gluconate Cloth  6 each Topical Daily  . dextrose  25 g Intravenous STAT  . heparin injection (subcutaneous)  5,000 Units Subcutaneous Q8H  . insulin aspart  0-20 Units Subcutaneous Q4H  . pantoprazole  40 mg Oral Daily   Continuous Infusions: . sodium chloride Stopped (03/06/21 1424)  . isoproterenol (ISUPREL) infusion Stopped (03/06/21 2102)  . lactated ringers     PRN Meds: docusate sodium, ipratropium-albuterol, polyethylene glycol   Vital Signs    Vitals:   03/07/21 0615 03/07/21 0630 03/07/21 0645 03/07/21 0700  BP: 119/60 139/74  (!) 117/48  Pulse: 72 75 69 80  Resp: (!) 23 (!) 21 15 (!) 22  Temp:    97.9 F (36.6 C)  TempSrc:    Oral  SpO2: 97% 96% 98% 94%  Weight:  59.4 kg    Height:        Intake/Output Summary (Last 24 hours) at 03/07/2021 0836 Last data filed at 03/07/2021 0700 Gross per 24 hour  Intake 131.67 ml  Output 685 ml  Net -553.33 ml   Last 3 Weights 03/07/2021 03/06/2021 03/05/2021  Weight (lbs) 130 lb 15.3 oz 127 lb 13.9 oz 140 lb  Weight (kg) 59.4 kg 58 kg 63.504 kg      Telemetry    AFib, occasionally rates in high30s-40s; frequent PVCs (mostly monomorphic), brief NSVT/accelerated idioventricular rhythm - Personally Reviewed  ECG    No new tracings - Personally Reviewed  Physical Exam  Alert, comfortable GEN: No acute distress.   Neck: No JVD Cardiac: RRR, no murmurs, rubs, or gallops.  Respiratory: Clear to auscultation bilaterally. GI: Soft,  nontender, non-distended  MS: No edema; L BKA Neuro:  Nonfocal  Psych: Normal affect   Labs    High Sensitivity Troponin:   Recent Labs  Lab 03/05/21 0551 03/05/21 0807  TROPONINIHS 46* 79*      Chemistry Recent Labs  Lab 03/05/21 0551 03/05/21 0621 03/05/21 0627 03/06/21 0013  NA 138 140 140 140  K 5.0 4.7 5.0 4.5  CL 103  --  105 105  CO2 22  --   --  26  GLUCOSE 366*  --  353* 99  BUN 54*  --  56* 57*  CREATININE 1.70*  --  1.60* 1.56*  CALCIUM 8.4*  --   --  9.0  PROT 6.7  --   --  6.4*  ALBUMIN 3.6  --   --  3.6  AST 77*  --   --  76*  ALT 56*  --   --  58*  ALKPHOS 93  --   --  98  BILITOT 1.0  --   --  0.6  GFRNONAA 41*  --   --  46*  ANIONGAP 13  --   --  9     Hematology Recent Labs  Lab 03/05/21 0551 03/05/21 0621 03/05/21 0627 03/06/21 0013  WBC 7.5  --   --  9.0  RBC 3.41*  --   --  3.42*  HGB  10.7* 10.5* 10.5* 10.6*  HCT 34.6* 31.0* 31.0* 33.4*  MCV 101.5*  --   --  97.7  MCH 31.4  --   --  31.0  MCHC 30.9  --   --  31.7  RDW 15.0  --   --  15.1  PLT 80*  --   --  124*    BNP Recent Labs  Lab 03/05/21 0957 03/06/21 0013  BNP 946.0* 2,293.2*     DDimer No results for input(s): DDIMER in the last 168 hours.   Radiology    ECHOCARDIOGRAM COMPLETE  Result Date: 03/05/2021    ECHOCARDIOGRAM REPORT   Patient Name:   Derek Hinton Date of Exam: 03/05/2021 Medical Rec #:  [id number]        Height:       67.0 in Accession #:    [id number]       Weight:       140.0 lb Date of Birth:  12-Aug-1946         BSA:          1.738 m Patient Age:    18 years         BP:           137/63 mmHg Patient Gender: M                HR:           69 bpm. Exam Location:  Inpatient Procedure: 2D Echo, Cardiac Doppler and Color Doppler Indications:    Acure respiratory distress  History:        Patient has prior history of Echocardiogram examinations, most                 recent 07/17/2018. CHF, COPD and Stroke, Arrythmias:Atrial                 Fibrillation and  Bradycardia, Signs/Symptoms:Chest Pain and                 Syncope; Risk Factors:Hypertension and Diabetes. Cardiogenic                 shock.  Sonographer:    Dustin Flock Referring Phys: [id number] MARGO T LANNAN IMPRESSIONS  1. Left ventricular ejection fraction, by estimation, is <20%. The left ventricle has severely decreased function. The left ventricle demonstrates regional wall motion abnormalities (see scoring diagram/findings for description). The left ventricular internal cavity size was mildly dilated. Left ventricular diastolic function could not be evaluated.  2. Right ventricular systolic function is low normal. The right ventricular size is normal. There is moderately elevated pulmonary artery systolic pressure.  3. Left atrial size was severely dilated.  4. Right atrial size was severely dilated.  5. The mitral valve is degenerative. Trivial mitral valve regurgitation. No evidence of mitral stenosis. Moderate mitral annular calcification.  6. Tricuspid valve regurgitation is moderate to severe.  7. The aortic valve is tricuspid. There is mild calcification of the aortic valve. There is mild thickening of the aortic valve. Aortic valve regurgitation is trivial. Mild aortic valve sclerosis is present, with no evidence of aortic valve stenosis.  8. The inferior vena cava is dilated in size with >50% respiratory variability, suggesting right atrial pressure of 8 mmHg. Comparison(s): Changes from prior study are noted. Apical segments now akinetic. Conclusion(s)/Recommendation(s): Severely reduced LV EF as noted, with biatrial enlargement. FINDINGS  Left Ventricle: Left ventricular ejection fraction, by estimation, is <20%. The left ventricle has severely decreased function. The left  ventricle demonstrates regional wall motion abnormalities. The left ventricular internal cavity size was mildly dilated. There is borderline left ventricular hypertrophy. Left ventricular diastolic function could not be  evaluated due to atrial fibrillation. Left ventricular diastolic function could not be evaluated.  LV Wall Scoring: The mid and distal lateral wall, mid and distal anterior septum, and entire apex are akinetic. The anterior wall, antero-lateral wall, inferior wall, basal anteroseptal segment, basal inferolateral segment, mid inferoseptal segment, and basal inferoseptal segment are hypokinetic. Right Ventricle: The right ventricular size is normal. Right vetricular wall thickness was not well visualized. Right ventricular systolic function is low normal. There is moderately elevated pulmonary artery systolic pressure. The tricuspid regurgitant velocity is 3.22 m/s, and with an assumed right atrial pressure of 8 mmHg, the estimated right ventricular systolic pressure is 96.0 mmHg. Left Atrium: Left atrial size was severely dilated. Right Atrium: Right atrial size was severely dilated. Pericardium: There is no evidence of pericardial effusion. Mitral Valve: The mitral valve is degenerative in appearance. Moderate mitral annular calcification. Trivial mitral valve regurgitation. No evidence of mitral valve stenosis. Tricuspid Valve: The tricuspid valve is normal in structure. Tricuspid valve regurgitation is moderate to severe. Aortic Valve: The aortic valve is tricuspid. There is mild calcification of the aortic valve. There is mild thickening of the aortic valve. Aortic valve regurgitation is trivial. Mild aortic valve sclerosis is present, with no evidence of aortic valve stenosis. Pulmonic Valve: The pulmonic valve was grossly normal. Pulmonic valve regurgitation is trivial. Aorta: The aortic root and ascending aorta are structurally normal, with no evidence of dilitation. Venous: The inferior vena cava is dilated in size with greater than 50% respiratory variability, suggesting right atrial pressure of 8 mmHg. IAS/Shunts: The atrial septum is grossly normal.  LEFT VENTRICLE PLAX 2D LVIDd:         5.10 cm   Diastology LVIDs:         4.50 cm  LV e' medial:    5.87 cm/s LV PW:         1.10 cm  LV E/e' medial:  10.2 LV IVS:        1.20 cm  LV e' lateral:   7.29 cm/s LVOT diam:     2.40 cm  LV E/e' lateral: 8.2 LV SV:         62 LV SV Index:   36 LVOT Area:     4.52 cm  RIGHT VENTRICLE RV Basal diam:  3.40 cm RV S prime:     7.83 cm/s TAPSE (M-mode): 1.9 cm LEFT ATRIUM             Index       RIGHT ATRIUM           Index LA diam:        4.50 cm 2.59 cm/m  RA Area:     23.30 cm LA Vol (A2C):   89.3 ml 51.39 ml/m RA Volume:   74.90 ml  43.10 ml/m LA Vol (A4C):   68.3 ml 39.30 ml/m LA Biplane Vol: 80.7 ml 46.44 ml/m  AORTIC VALVE LVOT Vmax:   66.60 cm/s LVOT Vmean:  41.700 cm/s LVOT VTI:    0.138 m  AORTA Ao Root diam: 3.30 cm MITRAL VALVE               TRICUSPID VALVE MV Area (PHT): 5.97 cm    TR Peak grad:   41.5 mmHg MV Decel Time: 127 msec    TR Vmax:  322.00 cm/s MV E velocity: 59.60 cm/s MV A velocity: 31.30 cm/s  SHUNTS MV E/A ratio:  1.90        Systemic VTI:  0.14 m                            Systemic Diam: 2.40 cm Buford Dresser MD Electronically signed by Buford Dresser MD Signature Date/Time: 03/05/2021/5:31:49 PM    Final     Cardiac Studies   Echo above  Patient Profile     75 y.o. male with chronic systolic and diastolic heart failure, ischemic cardiomyopathy with EF 15-20%, CAD with 3V disease, not a candidate for CABG, PAD s/p R iliac PTA and L BKA, persistent atrial fibrillation not on anticoagulation, ischemic CVA, type II diabetes, COPD admitted with severe symptomatic bradycardia (AFib with slow VR).  Assessment & Plan    1. AFib w slow VR: beta blocker stopped. His HR is now mostly in the 70s, but with occasional severe bradycardia due to high grade AV block. Would benefit from ongoing beta blocker therapy. Will ask EP for consultation re: Micra leadless pacemaker.  Not on anticoagulation due to severe bleeding while on clopidogrel for peripheral stent. 2.  Hypotension/shock: resolved; transient, bradycardia related on background of severe ischemic CMP. 3. AKI: improving, due to reduced cardiac output with bradycardia.  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 4. CAD: had some chest discomfort when hypotensive, currently asymptomatic. Not a candidate for revascularization. On statin. 5. PVCs: frequent, monomorphic, but no complex arrhythmia.  Beta blocker on hold. 6. CHF: appears euvolemic clinically and on CXR, compensated. BNP is not relevant (on Entresto, should check proBNP). 7. PAD: s/p L BKA and R arterial stent. 8. DM: fair control.  9. DNR  For questions or updates, please contact [place] Please consult [email or web address] for contact info under        Signed, Sanda Klein, MD  03/07/2021, 8:36 AM

## 2021-03-07 NOTE — Consult Note (Signed)
ELECTROPHYSIOLOGY CONSULT NOTE    Patient ID: Derek Hinton MRN: [id number], DOB/AGE: August 22, 1946 75 y.o.  Admit date: 03/05/2021 Date of Consult: 03/07/2021  Primary Physician: Guadalupe Maple, MD Primary Cardiologist: Sanda Klein, MD  Electrophysiologist: New  Referring Provider: Dr. Sallyanne Kuster  Patient Profile: Derek Hinton is a 75 y.o. male with a history of chronic combined CHF, ICM with EF 15-20%, CAD with 3V disease not CABG candidate, PAD s/p R iliac PTA and L BKA, persistent AF not on [place], ischemic CVA, DM2, and COPD admitted with severe symptomatic bradycardia who is being seen today for the evaluation of symptomatic bradycardia at the request of Dr. Sallyanne Kuster.  HPI:  Derek Hinton is a 75 y.o. male with medical history as above.   Pt presented via EMS for episodes of "dizziness" and CP and found to have CHB in the upper 20-30s on Toprol 50 mg daily. He apparently "flat lined" after arrival to the ED and received two precordial thumps before being externally paced, and started on dopamine and intubated.   Pertinent labs on admission include Cr 1.7 and K 5.0. Hgb 10.7  After dopamine started, pt regained pulses and was intermittently in AF. Patient was able to be weaned off pressors and extubated without difficulty after holding BB. EP asked to see for consideration of pacing system given his access issues. He is a DNR and has not wanted any "heroic measures" so ICD discussions were deferred. HS trop 46 -> 79  Patient is feeling OK currently. He has continued to have intermittent severe bradycardia with non-specific symptoms of fatigue. No angina, dyspnea, or syncope.  He is very active at baseline. He uses a prosthetic on his left BKA and ambulates for "over a mile" a day visiting the others in his facility.    Past Medical History:  Diagnosis Date  . COPD (chronic obstructive pulmonary disease) (HCC)    not on home O2  . Diabetes mellitus without complication ([place])    . Hypertension   . PVD (peripheral vascular disease) ([place])    s/p L BKA     Surgical History:  Past Surgical History:  Procedure Laterality Date  . ABDOMINAL AORTOGRAM W/LOWER EXTREMITY N/A 07/18/2018   Procedure: ABDOMINAL AORTOGRAM W/LOWER EXTREMITY;  Surgeon: Angelia Mould, MD;  Location: [facility];  Service: Cardiovascular;  Laterality: N/A;  . BELOW KNEE LEG AMPUTATION    . PERIPHERAL VASCULAR INTERVENTION Right 07/18/2018   Procedure: PERIPHERAL VASCULAR INTERVENTION;  Surgeon: Angelia Mould, MD;  Location: [facility];  Service: Cardiovascular;  Laterality: Right;  . RIGHT/LEFT HEART CATH AND CORONARY ANGIOGRAPHY N/A 07/21/2018   Procedure: RIGHT/LEFT HEART CATH AND CORONARY ANGIOGRAPHY;  Surgeon: Jettie Booze, MD;  Location: [facility];  Service: Cardiovascular;  Laterality: N/A;     Medications Prior to Admission  Medication Sig Dispense Refill Last Dose  . aspirin 81 MG tablet Take 81 mg by mouth daily.   03/03/2021  . atorvastatin (LIPITOR) 40 MG tablet Take 1 tablet (40 mg total) by mouth daily.   03/02/2021  . FARXIGA 10 MG TABS tablet Take 10 mg by mouth daily.   03/03/2021  . furosemide (LASIX) 40 MG tablet Take 80mg  in the morning and 40mg  in the pm (Patient taking differently: Take 40 mg by mouth daily.) 90 tablet 3 03/03/2021  . glipiZIDE (GLUCOTROL) 5 MG tablet Take 5 mg by mouth 2 (two) times daily.   03/03/2021  . iron polysaccharides (NIFEREX) 150 MG capsule  Take 150 mg by mouth 2 (two) times daily.   03/03/2021  . metFORMIN (GLUCOPHAGE) 1000 MG tablet Take 1,000 mg by mouth 2 (two) times daily.   03/03/2021  . metoprolol succinate (TOPROL-XL) 50 MG 24 hr tablet Take 1 tablet (50 mg total) by mouth daily. Take with or immediately following a meal. 30 tablet 3 03/03/2021 at 0900  . Multiple Vitamin (MULTIVITAMIN WITH MINERALS) TABS tablet Take 1 tablet by mouth daily.   03/03/2021  . sacubitril-valsartan (ENTRESTO) 49-51 MG  Take 1 tablet by mouth 2 (two) times daily. 60 tablet 3 03/03/2021  . sodium bicarbonate 650 MG tablet Take 650 mg by mouth 2 (two) times daily.   03/03/2021  . BREO ELLIPTA 100-25 MCG/INH AEPB Inhale 2 puffs into the lungs as directed.       Inpatient Medications:  . aspirin  81 mg Oral Daily  . atorvastatin  40 mg Oral Daily  . Chlorhexidine Gluconate Cloth  6 each Topical Daily  . dextrose  25 g Intravenous STAT  . heparin injection (subcutaneous)  5,000 Units Subcutaneous Q8H  . insulin aspart  0-20 Units Subcutaneous Q4H  . pantoprazole  40 mg Oral Daily    Allergies:  Allergies  Allergen Reactions  . Penicillins Other (See Comments)    Pt unsure, states he "didn't feel good" Patient states it made him "feel Funny"     Social History   Socioeconomic History  . Marital status: Divorced    Spouse name: Not on file  . Number of children: Not on file  . Years of education: Not on file  . Highest education level: Not on file  Occupational History  . Occupation: retired  Tobacco Use  . Smoking status: Former Smoker    Packs/day: 1.00    Years: 60.00    Pack years: 60.00    Quit date: 12/2017    Years since quitting: 3.2  . Smokeless tobacco: Never Used  Substance and Sexual Activity  . Alcohol use: Not Currently  . Drug use: Never  . Sexual activity: Not on file  Other Topics Concern  . Not on file  Social History Narrative  . Not on file   Social Determinants of Health   Financial Resource Strain: Not on file  Food Insecurity: Not on file  Transportation Needs: Not on file  Physical Activity: Not on file  Stress: Not on file  Social Connections: Not on file  Intimate Partner Violence: Not on file     Family History  Problem Relation Age of Onset  . Diabetes Father      Review of Systems: All other systems reviewed and are otherwise negative except as noted above.  Physical Exam: Vitals:   03/07/21 0800 03/07/21 0830 03/07/21 0900 03/07/21 1100   BP: (!) 82/52 113/63 129/66   Pulse: (!) 41 71 65   Resp: 20 15 (!) 21   Temp:    97.9 F (36.6 C)  TempSrc:      SpO2: 92% 95% 97%   Weight:      Height:        GEN- The patient is well appearing, alert and oriented x 3 today.   HEENT: normocephalic, atraumatic; sclera clear, conjunctiva pink; hearing intact; oropharynx clear; neck supple Lungs- Clear to ausculation bilaterally, normal work of breathing.  No wheezes, rales, rhonchi Heart- Regular rate and rhythm, no murmurs, rubs or gallops GI- soft, non-tender, non-distended, bowel sounds present Extremities- no clubbing, cyanosis, or edema; DP/PT/radial pulses 2+  bilaterally MS- no significant deformity or atrophy Skin- warm and dry, no rash or lesion Psych- euthymic mood, full affect Neuro- strength and sensation are intact  Labs:   Lab Results  Component Value Date   WBC 9.0 03/06/2021   HGB 10.6 (L) 03/06/2021   HCT 33.4 (L) 03/06/2021   MCV 97.7 03/06/2021   PLT 124 (L) 03/06/2021    Recent Labs  Lab 03/06/21 0013  NA 140  K 4.5  CL 105  CO2 26  BUN 57*  CREATININE 1.56*  CALCIUM 9.0  PROT 6.4*  BILITOT 0.6  ALKPHOS 98  ALT 58*  AST 76*  GLUCOSE 99      Radiology/Studies: DG Chest Portable 1 View  Result Date: 03/05/2021 CLINICAL DATA:  Intubation EXAM: PORTABLE CHEST 1 VIEW COMPARISON:  07/06/2020 FINDINGS: Cardiomegaly. Small left pleural effusion. Volume loss in the left chest. Endotracheal tube with tip at the clavicular heads. Extensive artifact from EKG leads. Remote right rib fracture or IMPRESSION: 1. Unremarkable endotracheal tube. 2. Chronic left pleural effusion or pleural thickening with volume loss. No acute finding when compared to 2021 comparison. Electronically Signed   By: Monte Fantasia M.D.   On: 03/05/2021 06:34   ECHOCARDIOGRAM COMPLETE  Result Date: 03/05/2021    ECHOCARDIOGRAM REPORT   Patient Name:   KALON ERHARDT Date of Exam: 03/05/2021 Medical Rec #:  [id number]         Height:       67.0 in Accession #:    [id number]       Weight:       140.0 lb Date of Birth:  June 29, 1946         BSA:          1.738 m Patient Age:    89 years         BP:           137/63 mmHg Patient Gender: M                HR:           69 bpm. Exam Location:  Inpatient Procedure: 2D Echo, Cardiac Doppler and Color Doppler Indications:    Acure respiratory distress  History:        Patient has prior history of Echocardiogram examinations, most                 recent 07/17/2018. CHF, COPD and Stroke, Arrythmias:Atrial                 Fibrillation and Bradycardia, Signs/Symptoms:Chest Pain and                 Syncope; Risk Factors:Hypertension and Diabetes. Cardiogenic                 shock.  Sonographer:    Dustin Flock Referring Phys: [id number] MARGO T LANNAN IMPRESSIONS  1. Left ventricular ejection fraction, by estimation, is <20%. The left ventricle has severely decreased function. The left ventricle demonstrates regional wall motion abnormalities (see scoring diagram/findings for description). The left ventricular internal cavity size was mildly dilated. Left ventricular diastolic function could not be evaluated.  2. Right ventricular systolic function is low normal. The right ventricular size is normal. There is moderately elevated pulmonary artery systolic pressure.  3. Left atrial size was severely dilated.  4. Right atrial size was severely dilated.  5. The mitral valve is degenerative. Trivial mitral valve regurgitation. No evidence of mitral stenosis. Moderate mitral annular calcification.  6. Tricuspid valve regurgitation is moderate to severe.  7. The aortic valve is tricuspid. There is mild calcification of the aortic valve. There is mild thickening of the aortic valve. Aortic valve regurgitation is trivial. Mild aortic valve sclerosis is present, with no evidence of aortic valve stenosis.  8. The inferior vena cava is dilated in size with >50% respiratory variability, suggesting right atrial  pressure of 8 mmHg. Comparison(s): Changes from prior study are noted. Apical segments now akinetic. Conclusion(s)/Recommendation(s): Severely reduced LV EF as noted, with biatrial enlargement. FINDINGS  Left Ventricle: Left ventricular ejection fraction, by estimation, is <20%. The left ventricle has severely decreased function. The left ventricle demonstrates regional wall motion abnormalities. The left ventricular internal cavity size was mildly dilated. There is borderline left ventricular hypertrophy. Left ventricular diastolic function could not be evaluated due to atrial fibrillation. Left ventricular diastolic function could not be evaluated.  LV Wall Scoring: The mid and distal lateral wall, mid and distal anterior septum, and entire apex are akinetic. The anterior wall, antero-lateral wall, inferior wall, basal anteroseptal segment, basal inferolateral segment, mid inferoseptal segment, and basal inferoseptal segment are hypokinetic. Right Ventricle: The right ventricular size is normal. Right vetricular wall thickness was not well visualized. Right ventricular systolic function is low normal. There is moderately elevated pulmonary artery systolic pressure. The tricuspid regurgitant velocity is 3.22 m/s, and with an assumed right atrial pressure of 8 mmHg, the estimated right ventricular systolic pressure is 68.3 mmHg. Left Atrium: Left atrial size was severely dilated. Right Atrium: Right atrial size was severely dilated. Pericardium: There is no evidence of pericardial effusion. Mitral Valve: The mitral valve is degenerative in appearance. Moderate mitral annular calcification. Trivial mitral valve regurgitation. No evidence of mitral valve stenosis. Tricuspid Valve: The tricuspid valve is normal in structure. Tricuspid valve regurgitation is moderate to severe. Aortic Valve: The aortic valve is tricuspid. There is mild calcification of the aortic valve. There is mild thickening of the aortic valve.  Aortic valve regurgitation is trivial. Mild aortic valve sclerosis is present, with no evidence of aortic valve stenosis. Pulmonic Valve: The pulmonic valve was grossly normal. Pulmonic valve regurgitation is trivial. Aorta: The aortic root and ascending aorta are structurally normal, with no evidence of dilitation. Venous: The inferior vena cava is dilated in size with greater than 50% respiratory variability, suggesting right atrial pressure of 8 mmHg. IAS/Shunts: The atrial septum is grossly normal.  LEFT VENTRICLE PLAX 2D LVIDd:         5.10 cm  Diastology LVIDs:         4.50 cm  LV e' medial:    5.87 cm/s LV PW:         1.10 cm  LV E/e' medial:  10.2 LV IVS:        1.20 cm  LV e' lateral:   7.29 cm/s LVOT diam:     2.40 cm  LV E/e' lateral: 8.2 LV SV:         62 LV SV Index:   36 LVOT Area:     4.52 cm  RIGHT VENTRICLE RV Basal diam:  3.40 cm RV S prime:     7.83 cm/s TAPSE (M-mode): 1.9 cm LEFT ATRIUM             Index       RIGHT ATRIUM           Index LA diam:        4.50 cm 2.59 cm/m  RA Area:  23.30 cm LA Vol (A2C):   89.3 ml 51.39 ml/m RA Volume:   74.90 ml  43.10 ml/m LA Vol (A4C):   68.3 ml 39.30 ml/m LA Biplane Vol: 80.7 ml 46.44 ml/m  AORTIC VALVE LVOT Vmax:   66.60 cm/s LVOT Vmean:  41.700 cm/s LVOT VTI:    0.138 m  AORTA Ao Root diam: 3.30 cm MITRAL VALVE               TRICUSPID VALVE MV Area (PHT): 5.97 cm    TR Peak grad:   41.5 mmHg MV Decel Time: 127 msec    TR Vmax:        322.00 cm/s MV E velocity: 59.60 cm/s MV A velocity: 31.30 cm/s  SHUNTS MV E/A ratio:  1.90        Systemic VTI:  0.14 m                            Systemic Diam: 2.40 cm Buford Dresser MD Electronically signed by Buford Dresser MD Signature Date/Time: 03/05/2021/5:31:49 PM    Final     EKG: on arrival showed AF with controlled rates (personally reviewed) Other EKG 03/05/2021 showed external pacing spikes From EMS shows CHB in 20-30s  TELEMETRY: shows AF with slow ventricular rates as low as 30s  (personally reviewed)  Assessment/Plan: 1.  Atrial fibrillation with slow ventricular rates -> intermittent CHB Not on [place] with previous significant bleeding on clopidogrel s/p stent.  He has symptomatic bradycardia without reversible cause and is thus indicated for pacing. He will likely require V pacing > 40% and would recommend CRT system vs conduction system pacing. See below for discussion re: ICD. Explained risks, benefits, and alternatives to PPM implantation, including but not limited to bleeding, infection, pneumothorax, pericardial effusion, lead dislodgement, heart attack, stroke, or death.  Pt verbalized understanding and agrees to proceed.   2. Chronic systolic CHF EF < 77% 05/31/2352 He will need to continue BB for his ICM and systolic dysfunction We discussed the indications for ICD and he verbalizes understand. He understands from previous discussions that if he were to get to the point he would need an ICD shock, he would be unlikely to recover, and thus in shared decision making has decided to pursue pacing only.  3. ICM Denies s/s ischemia Pt is not a candidate for CABG based off of previous consults and has no options for revascularization   For questions or updates, please contact [facility] Please consult [email or web address] for contact info under Cardiology/STEMI.  Jacalyn Lefevre, PA-C  03/07/2021 1:22 PM

## 2021-03-08 ENCOUNTER — Inpatient Hospital Stay (HOSPITAL_COMMUNITY)
Admission: EM | Disposition: A | Discharge status: Skilled Nursing Facility | Payer: Self-pay | Source: Home / Self Care | Attending: Cardiovascular Disease

## 2021-03-08 DIAGNOSIS — I442 Atrioventricular block, complete: Secondary | ICD-10-CM | POA: Diagnosis not present

## 2021-03-08 HISTORY — PX: PACEMAKER IMPLANT: EP1218

## 2021-03-08 LAB — BASIC METABOLIC PANEL
Anion gap: 10 (ref 5–15)
Anion gap: 11 (ref 5–15)
BUN: 80 mg/dL — ABNORMAL HIGH (ref 8–23)
BUN: 70 mg/dL — ABNORMAL HIGH (ref 8–23)
CO2: 20 mmol/L — ABNORMAL LOW (ref 22–32)
CO2: 19 mmol/L — ABNORMAL LOW (ref 22–32)
Calcium: 8.6 mg/dL — ABNORMAL LOW (ref 8.9–10.3)
Calcium: 8.5 mg/dL — ABNORMAL LOW (ref 8.9–10.3)
Chloride: 104 mmol/L (ref 98–111)
Chloride: 106 mmol/L (ref 98–111)
Creatinine, Ser: 2.05 mg/dL — ABNORMAL HIGH (ref 0.61–1.24)
Creatinine, Ser: 1.68 mg/dL — ABNORMAL HIGH (ref 0.61–1.24)
GFR, Estimated: 33 mL/min — ABNORMAL LOW (ref >60)
GFR, Estimated: 42 mL/min — ABNORMAL LOW (ref >60)
Glucose, Bld: 149 mg/dL — ABNORMAL HIGH (ref 70–99)
Glucose, Bld: 137 mg/dL — ABNORMAL HIGH (ref 70–99)
Potassium: 5 mmol/L (ref 3.5–5.1)
Potassium: 5.4 mmol/L — ABNORMAL HIGH (ref 3.5–5.1)
Sodium: 134 mmol/L — ABNORMAL LOW (ref 135–145)
Sodium: 136 mmol/L (ref 135–145)

## 2021-03-08 LAB — GLUCOSE, CAPILLARY
Glucose-Capillary: 144 mg/dL — ABNORMAL HIGH (ref 70–99)
Glucose-Capillary: 105 mg/dL — ABNORMAL HIGH (ref 70–99)
Glucose-Capillary: 107 mg/dL — ABNORMAL HIGH (ref 70–99)
Glucose-Capillary: 127 mg/dL — ABNORMAL HIGH (ref 70–99)
Glucose-Capillary: 143 mg/dL — ABNORMAL HIGH (ref 70–99)
Glucose-Capillary: 125 mg/dL — ABNORMAL HIGH (ref 70–99)
Glucose-Capillary: 213 mg/dL — ABNORMAL HIGH (ref 70–99)

## 2021-03-08 LAB — SURGICAL PCR SCREEN
MRSA, PCR: NEGATIVE
Staphylococcus aureus: NEGATIVE

## 2021-03-08 SURGERY — PACEMAKER IMPLANT

## 2021-03-08 MED ORDER — HEPARIN (PORCINE) IN NACL 1000-0.9 UT/500ML-% IV SOLN
INTRAVENOUS | Status: DC | PRN
  Administered 2021-03-08: 500 mL

## 2021-03-08 MED ORDER — VANCOMYCIN HCL 1000 MG/200ML IV SOLN
1000.0000 mg | INTRAVENOUS | Status: AC
  Administered 2021-03-08: 1000 mg via INTRAVENOUS
  Filled 2021-03-08: qty 200

## 2021-03-08 MED ORDER — SODIUM CHLORIDE 0.9 % IV SOLN: INTRAVENOUS | Status: DC

## 2021-03-08 MED ORDER — INSULIN ASPART 100 UNIT/ML IJ SOLN
0.0000 [IU] | Freq: Three times a day (TID) | INTRAMUSCULAR | Status: DC
  Administered 2021-03-08 (×2): 2 [IU] via SUBCUTANEOUS
  Administered 2021-03-09: 3 [IU] via SUBCUTANEOUS
  Administered 2021-03-09: 5 [IU] via SUBCUTANEOUS

## 2021-03-08 MED ORDER — VANCOMYCIN HCL 1000 MG/200ML IV SOLN
1000.0000 mg | Freq: Two times a day (BID) | INTRAVENOUS | Status: AC
  Administered 2021-03-09: 1000 mg via INTRAVENOUS
  Filled 2021-03-08: qty 200

## 2021-03-08 MED ORDER — ONDANSETRON HCL 4 MG/2ML IJ SOLN: 4.0000 mg | Freq: Four times a day (QID) | INTRAMUSCULAR | Status: DC | PRN

## 2021-03-08 MED ORDER — LIDOCAINE HCL (PF) 1 % IJ SOLN
INTRAMUSCULAR | Status: DC | PRN
  Administered 2021-03-08: 60 mL

## 2021-03-08 MED ORDER — CHLORHEXIDINE GLUCONATE 4 % EX LIQD
60.0000 mL | Freq: Once | CUTANEOUS | Status: AC
  Administered 2021-03-08: 4 via TOPICAL

## 2021-03-08 MED ORDER — LIDOCAINE HCL 1 % IJ SOLN
INTRAMUSCULAR | Status: AC
  Filled 2021-03-08: qty 60

## 2021-03-08 MED ORDER — ATROPINE SULFATE 1 MG/ML IJ SOLN: 1.0000 mg | Freq: Once | INTRAMUSCULAR | Status: DC | PRN

## 2021-03-08 MED ORDER — SODIUM CHLORIDE 0.9 % IV SOLN
80.0000 mg | INTRAVENOUS | Status: AC
  Administered 2021-03-08: 80 mg
  Filled 2021-03-08: qty 2

## 2021-03-08 MED ORDER — HEPARIN (PORCINE) IN NACL 1000-0.9 UT/500ML-% IV SOLN
INTRAVENOUS | Status: AC
  Filled 2021-03-08: qty 500

## 2021-03-08 MED ORDER — SODIUM CHLORIDE 0.9 % IV SOLN
INTRAVENOUS | Status: AC
  Filled 2021-03-08: qty 2

## 2021-03-08 MED ORDER — CHLORHEXIDINE GLUCONATE 4 % EX LIQD
60.0000 mL | Freq: Once | CUTANEOUS | Status: AC
  Administered 2021-03-08: 4 via TOPICAL
  Filled 2021-03-08: qty 15

## 2021-03-08 MED ORDER — ACETAMINOPHEN 325 MG PO TABS
325.0000 mg | ORAL_TABLET | ORAL | Status: DC | PRN
  Administered 2021-03-08 – 2021-03-09 (×3): 650 mg via ORAL
  Filled 2021-03-08 (×3): qty 2

## 2021-03-08 SURGICAL SUPPLY — 15 items
ATTRACTOMAT 16X20 MAGNETIC DRP (DRAPES) ×2 IMPLANT
CABLE SURGICAL S-101-97-12 (CABLE) ×2 IMPLANT
CATH RIGHTSITE C315HIS02 (CATHETERS) ×2 IMPLANT
HEMOSTAT SURGICEL 2X4 FIBR (HEMOSTASIS) ×2 IMPLANT
KIT MICROPUNCTURE NIT STIFF (SHEATH) ×2 IMPLANT
LEAD SELECT SECURE 3830 383069 (Lead) ×1 IMPLANT
MAT PREVALON FULL STRYKER (MISCELLANEOUS) ×2 IMPLANT
PACEMAKER ASSURITY SR-SF (Pacemaker) ×2 IMPLANT
PAD PRO RADIOLUCENT 2001M-C (PAD) ×2 IMPLANT
SELECT SECURE 3830 383069 (Lead) ×2 IMPLANT
SHEATH 8FR PRELUDE SNAP 13 (SHEATH) IMPLANT
SHEATH 9.5FR PRELUDE SNAP 13 (SHEATH) ×2 IMPLANT
SLITTER 6232ADJ (MISCELLANEOUS) ×2 IMPLANT
TRAY PACEMAKER INSERTION (PACKS) ×2 IMPLANT
WIRE HI TORQ VERSACORE-J 145CM (WIRE) ×2 IMPLANT

## 2021-03-08 NOTE — Progress Notes (Signed)
BP 135  Will decrease dopamine Will recheck BMET with recent change in Cr, elevated CVP so may benefit from diuresis Will hold for now for resuming BB  VFreq PVCs may be 2/2 BB withdrawal

## 2021-03-08 NOTE — Progress Notes (Addendum)
Electrophysiology Rounding Note  Patient Name: Derek Hinton Date of Encounter: 03/08/2021  Primary Cardiologist: Sanda Jayanna Kroeger, MD Electrophysiologist: New   Subjective   The patient is doing well this am. Had anxiety and sense of impending doom overnight with more symptomatic bradycardia in low 30s requiring re-initiation of dopamine.  Re-iterates questions about post procedure restrictions.   Inpatient Medications    Scheduled Meds: . aspirin  81 mg Oral Daily  . atorvastatin  40 mg Oral Daily  . Chlorhexidine Gluconate Cloth  6 each Topical Daily  . gentamicin irrigation  80 mg Irrigation On Call  . insulin aspart  0-20 Units Subcutaneous Q4H  . pantoprazole  40 mg Oral Daily   Continuous Infusions: . sodium chloride 5.57 mL/hr at 03/08/21 0004  . sodium chloride    . sodium chloride 50 mL/hr at 03/08/21 0700  . DOPamine 5 mcg/kg/min (03/08/21 0700)  . lactated ringers    . vancomycin     PRN Meds: ALPRAZolam, atropine, docusate sodium, ipratropium-albuterol, polyethylene glycol   Vital Signs    Vitals:   03/08/21 0400 03/08/21 0500 03/08/21 0600 03/08/21 0700  BP: (!) 113/55 (!) 141/76 133/65 (!) 104/93  Pulse: 72 72 60 67  Resp: 13 13 13 17   Temp: 98.5 F (36.9 C)     TempSrc: Oral     SpO2: 90% 98% 96% 99%  Weight:  60.1 kg    Height:        Intake/Output Summary (Last 24 hours) at 03/08/2021 0735 Last data filed at 03/08/2021 0700 Gross per 24 hour  Intake 506.33 ml  Output 400 ml  Net 106.33 ml   Filed Weights   03/06/21 0500 03/07/21 0630 03/08/21 0500  Weight: 58 kg 59.4 kg 60.1 kg    Physical Exam    GEN- The patient is elderly appearing, alert and oriented x 3 today.   Head- normocephalic, atraumatic Eyes-  Sclera clear, conjunctiva pink Ears- hearing intact Oropharynx- clear Neck- supple Lungs- Clear to ausculation bilaterally, normal work of breathing Heart- Regular rate and rhythm, no murmurs, rubs or gallops GI- soft, NT, ND, +  BS Extremities- no clubbing or cyanosis. No edema Skin- no rash or lesion Psych- euthymic mood, full affect Neuro- strength and sensation are intact  Labs    CBC Recent Labs    03/06/21 0013  WBC 9.0  HGB 10.6*  HCT 33.4*  MCV 97.7  PLT 852*   Basic Metabolic Panel Recent Labs    03/06/21 0013 03/08/21 0025  NA 140 134*  K 4.5 5.0  CL 105 104  CO2 26 20*  GLUCOSE 99 149*  BUN 57* 80*  CREATININE 1.56* 2.05*  CALCIUM 9.0 8.6*  MG 2.3  --    Liver Function Tests Recent Labs    03/06/21 0013  AST 76*  ALT 58*  ALKPHOS 98  BILITOT 0.6  PROT 6.4*  ALBUMIN 3.6   No results for input(s): LIPASE, AMYLASE in the last 72 hours. Cardiac Enzymes No results for input(s): CKTOTAL, CKMB, CKMBINDEX, TROPONINI in the last 72 hours.  LHC 07/21/2018  Prox RCA lesion is 80% stenosed.  Mid RCA lesion is 80% stenosed.  Ost RPDA to RPDA lesion is 90% stenosed.  Prox Cx to Mid Cx lesion is 80% stenosed.  Prox LAD lesion is 90% stenosed.  Ost 1st Diag lesion is 75% stenosed.  Mid LAD lesion is 75% stenosed.  LV end diastolic pressure is normal.  There is no aortic valve stenosis.  Hemodynamic findings consistent with mild pulmonary hypertension.  Ao sat 90%, PA 58%, PA pressure 54/19, mean PA 32 mm Hg; PCWP 16/17; mean PCWP 14 mm Hg; CO 4.7 L/min; CI 2.68    Telemetry    Atrial fibrillation with slow VR, into 30s up until around 0100 when dopamine was restarted. (personally reviewed)  Radiology    No results found.  Patient Profile     Derek Hinton is a 75 y.o. male with a history of chronic combined CHF, ICM with EF 15-20%, CAD with 3V disease not CABG candidate, PAD s/p R iliac PTA and L BKA, persistent AF not on [place], ischemic CVA, DM2, and COPD admitted with severe symptomatic bradycardia who is being seen today for the evaluation of symptomatic bradycardia at the request of Dr. Sallyanne Kuster.  Assessment & Plan    1.  Atrial fibrillation with slow  ventricular rates -> intermittent CHB Not on [place] with previous significant bleeding on clopidogrel s/p stent.  He has symptomatic bradycardia without reversible cause and is thus indicated for pacing. He will likely require V pacing > 40% and would recommend CRT system vs conduction system pacing. See below for discussion re: ICD. Explained risks, benefits, and alternatives to PPM implantation, including but not limited to bleeding, infection, pneumothorax, pericardial effusion, lead dislodgement, heart attack, stroke, or death.  Pt verbalized understanding and agrees to proceed.  Daughter present today and reviewed risks and benefits with her as well.  Required dopamine again overnight.   2. Chronic systolic CHF EF < 75% 8/83/2549 He will need to continue BB for his ICM and systolic dysfunction We discussed the indications for ICD and he verbalizes understanding. He understands from previous discussions that if he were to get to the point he would need an ICD shock, he would be unlikely to recover, and thus in shared decision making has decided to pursue pacing only.  3. ICM Denies s/s ischemia He has known significant disease that is not amenable to revascularization or CABG.   Plan for pacing this afternoon. Orders in.   For questions or updates, please contact [place] Please consult [email or web address] for contact info under Cardiology/STEMI.  Signed, Shirley Friar, PA-C  03/08/2021, 7:35 AM    Pt seen and examined and plan reviewed  Discussed with DR CL Will plan LBBA pacing and if unsuccessful anticipate LV lead. Rhythm notable this am for AIVR  And freq PVCs, previously on ECGs but not frequently  Will have to keep track (device should allow) and make a determination as to possibly contributing to his cardiomyopathy.  Risks and benefits reviewed and with shared decision making elected to proceed with PM implant

## 2021-03-09 ENCOUNTER — Encounter (HOSPITAL_COMMUNITY): Payer: Self-pay | Admitting: Internal Medicine

## 2021-03-09 ENCOUNTER — Other Ambulatory Visit: Payer: Self-pay

## 2021-03-09 ENCOUNTER — Inpatient Hospital Stay (HOSPITAL_COMMUNITY): Payer: Medicare Other

## 2021-03-09 DIAGNOSIS — I5042 Chronic combined systolic (congestive) and diastolic (congestive) heart failure: Secondary | ICD-10-CM | POA: Diagnosis not present

## 2021-03-09 DIAGNOSIS — I442 Atrioventricular block, complete: Secondary | ICD-10-CM | POA: Diagnosis not present

## 2021-03-09 DIAGNOSIS — I700 Atherosclerosis of aorta: Secondary | ICD-10-CM | POA: Diagnosis not present

## 2021-03-09 DIAGNOSIS — I9589 Other hypotension: Secondary | ICD-10-CM | POA: Diagnosis not present

## 2021-03-09 DIAGNOSIS — Z7982 Long term (current) use of aspirin: Secondary | ICD-10-CM | POA: Diagnosis not present

## 2021-03-09 DIAGNOSIS — J9600 Acute respiratory failure, unspecified whether with hypoxia or hypercapnia: Secondary | ICD-10-CM | POA: Diagnosis not present

## 2021-03-09 DIAGNOSIS — Z7984 Long term (current) use of oral hypoglycemic drugs: Secondary | ICD-10-CM | POA: Diagnosis not present

## 2021-03-09 DIAGNOSIS — I739 Peripheral vascular disease, unspecified: Secondary | ICD-10-CM | POA: Diagnosis not present

## 2021-03-09 DIAGNOSIS — Z48812 Encounter for surgical aftercare following surgery on the circulatory system: Secondary | ICD-10-CM | POA: Diagnosis not present

## 2021-03-09 DIAGNOSIS — T68XXXA Hypothermia, initial encounter: Secondary | ICD-10-CM | POA: Diagnosis not present

## 2021-03-09 DIAGNOSIS — R42 Dizziness and giddiness: Secondary | ICD-10-CM | POA: Diagnosis not present

## 2021-03-09 DIAGNOSIS — C439 Malignant melanoma of skin, unspecified: Secondary | ICD-10-CM | POA: Diagnosis not present

## 2021-03-09 DIAGNOSIS — E875 Hyperkalemia: Secondary | ICD-10-CM | POA: Diagnosis not present

## 2021-03-09 DIAGNOSIS — N179 Acute kidney failure, unspecified: Secondary | ICD-10-CM | POA: Diagnosis not present

## 2021-03-09 DIAGNOSIS — Z006 Encounter for examination for normal comparison and control in clinical research program: Secondary | ICD-10-CM | POA: Diagnosis not present

## 2021-03-09 DIAGNOSIS — E1122 Type 2 diabetes mellitus with diabetic chronic kidney disease: Secondary | ICD-10-CM | POA: Diagnosis not present

## 2021-03-09 DIAGNOSIS — I129 Hypertensive chronic kidney disease with stage 1 through stage 4 chronic kidney disease, or unspecified chronic kidney disease: Secondary | ICD-10-CM | POA: Diagnosis not present

## 2021-03-09 DIAGNOSIS — D696 Thrombocytopenia, unspecified: Secondary | ICD-10-CM | POA: Diagnosis not present

## 2021-03-09 DIAGNOSIS — R531 Weakness: Secondary | ICD-10-CM | POA: Diagnosis not present

## 2021-03-09 DIAGNOSIS — I509 Heart failure, unspecified: Secondary | ICD-10-CM | POA: Diagnosis not present

## 2021-03-09 DIAGNOSIS — N183 Chronic kidney disease, stage 3 unspecified: Secondary | ICD-10-CM | POA: Diagnosis not present

## 2021-03-09 DIAGNOSIS — E876 Hypokalemia: Secondary | ICD-10-CM | POA: Diagnosis not present

## 2021-03-09 DIAGNOSIS — Z87891 Personal history of nicotine dependence: Secondary | ICD-10-CM | POA: Diagnosis not present

## 2021-03-09 DIAGNOSIS — I272 Pulmonary hypertension, unspecified: Secondary | ICD-10-CM | POA: Diagnosis not present

## 2021-03-09 DIAGNOSIS — I130 Hypertensive heart and chronic kidney disease with heart failure and stage 1 through stage 4 chronic kidney disease, or unspecified chronic kidney disease: Secondary | ICD-10-CM | POA: Diagnosis not present

## 2021-03-09 DIAGNOSIS — Z79899 Other long term (current) drug therapy: Secondary | ICD-10-CM | POA: Diagnosis not present

## 2021-03-09 DIAGNOSIS — Z950 Presence of cardiac pacemaker: Secondary | ICD-10-CM | POA: Diagnosis not present

## 2021-03-09 DIAGNOSIS — X31XXXA Exposure to excessive natural cold, initial encounter: Secondary | ICD-10-CM | POA: Diagnosis not present

## 2021-03-09 DIAGNOSIS — I2510 Atherosclerotic heart disease of native coronary artery without angina pectoris: Secondary | ICD-10-CM | POA: Diagnosis not present

## 2021-03-09 DIAGNOSIS — D61818 Other pancytopenia: Secondary | ICD-10-CM | POA: Diagnosis not present

## 2021-03-09 DIAGNOSIS — I110 Hypertensive heart disease with heart failure: Secondary | ICD-10-CM | POA: Diagnosis not present

## 2021-03-09 DIAGNOSIS — R570 Cardiogenic shock: Secondary | ICD-10-CM | POA: Diagnosis not present

## 2021-03-09 DIAGNOSIS — R918 Other nonspecific abnormal finding of lung field: Secondary | ICD-10-CM | POA: Diagnosis not present

## 2021-03-09 DIAGNOSIS — J9601 Acute respiratory failure with hypoxia: Secondary | ICD-10-CM | POA: Diagnosis not present

## 2021-03-09 DIAGNOSIS — R001 Bradycardia, unspecified: Secondary | ICD-10-CM | POA: Diagnosis not present

## 2021-03-09 DIAGNOSIS — E119 Type 2 diabetes mellitus without complications: Secondary | ICD-10-CM | POA: Diagnosis not present

## 2021-03-09 DIAGNOSIS — Z20822 Contact with and (suspected) exposure to covid-19: Secondary | ICD-10-CM | POA: Diagnosis not present

## 2021-03-09 DIAGNOSIS — J449 Chronic obstructive pulmonary disease, unspecified: Secondary | ICD-10-CM | POA: Diagnosis not present

## 2021-03-09 DIAGNOSIS — Z66 Do not resuscitate: Secondary | ICD-10-CM | POA: Diagnosis not present

## 2021-03-09 DIAGNOSIS — E118 Type 2 diabetes mellitus with unspecified complications: Secondary | ICD-10-CM | POA: Diagnosis not present

## 2021-03-09 DIAGNOSIS — I469 Cardiac arrest, cause unspecified: Secondary | ICD-10-CM | POA: Diagnosis not present

## 2021-03-09 DIAGNOSIS — G9341 Metabolic encephalopathy: Secondary | ICD-10-CM | POA: Diagnosis not present

## 2021-03-09 LAB — BASIC METABOLIC PANEL WITH GFR
Anion gap: 10 (ref 5–15)
BUN: 73 mg/dL — ABNORMAL HIGH (ref 8–23)
CO2: 18 mmol/L — ABNORMAL LOW (ref 22–32)
Calcium: 8.7 mg/dL — ABNORMAL LOW (ref 8.9–10.3)
Chloride: 107 mmol/L (ref 98–111)
Creatinine, Ser: 1.72 mg/dL — ABNORMAL HIGH (ref 0.61–1.24)
GFR, Estimated: 41 mL/min — ABNORMAL LOW
Glucose, Bld: 208 mg/dL — ABNORMAL HIGH (ref 70–99)
Potassium: 5.1 mmol/L (ref 3.5–5.1)
Sodium: 135 mmol/L (ref 135–145)

## 2021-03-09 LAB — GLUCOSE, CAPILLARY
Glucose-Capillary: 182 mg/dL — ABNORMAL HIGH (ref 70–99)
Glucose-Capillary: 227 mg/dL — ABNORMAL HIGH (ref 70–99)

## 2021-03-09 LAB — RESP PANEL BY RT-PCR (FLU A&B, COVID) ARPGX2
Influenza A by PCR: NEGATIVE
Influenza B by PCR: NEGATIVE
SARS Coronavirus 2 by RT PCR: NEGATIVE

## 2021-03-09 MED ORDER — ASPIRIN 81 MG PO CHEW: 81.0000 mg | CHEWABLE_TABLET | Freq: Every day | ORAL | Status: DC

## 2021-03-09 MED ORDER — ASPIRIN 81 MG PO TABS: 81.0000 mg | ORAL_TABLET | Freq: Every day | ORAL | Status: DC

## 2021-03-09 MED ORDER — ACETAMINOPHEN 325 MG PO TABS: 325.0000 mg | ORAL_TABLET | ORAL | Status: AC | PRN

## 2021-03-09 MED ORDER — ASPIRIN 81 MG PO TABS: 81.0000 mg | ORAL_TABLET | Freq: Every day | ORAL | 6 refills | Status: AC

## 2021-03-09 MED FILL — Lidocaine HCl Local Inj 1%: INTRAMUSCULAR | Qty: 60 | Status: AC

## 2021-03-09 NOTE — Progress Notes (Signed)
All patient belongs returned, all questions answered to patient satisfaction.  Kathleene Hazel RN

## 2021-03-09 NOTE — Evaluation (Signed)
Physical Therapy Evaluation Patient Details Name: Derek Hinton MRN: [id number] DOB: 1946/06/22 Today's Date: 03/09/2021   History of Present Illness  Pt is a 75 y/o male admitted secondary to syncope and bradycardia. Pt was intubated and extubated on 5/29. Pt is s/p pacemaker placement on 6/1. PMH includes COPD, DM, HTN, remote squamous cell carcinoma of skin (removed), prior CVA, L BKA, and CHF.  Clinical Impression  Pt admitted secondary to problem above with deficits below. Pt requiring min to min guard A for transfers and short distance gait. Educated about pacemaker precautions and maintained pretty well following cues. Pt reports he lives at [facility] and has staff to assists with transfers and ADL tasks. Reports normally using WC when not with PT. Recommend continued PT at SNF level at d/c. Will continue to follow acutely.     Follow Up Recommendations SNF;Supervision/Assistance - 24 hour    Equipment Recommendations  None recommended by PT    Recommendations for Other Services       Precautions / Restrictions Precautions Precautions: Fall;ICD/Pacemaker Precaution Comments: Reviewed pacemaker precautions with pt. L BKA at baseline Required Braces or Orthoses: Other Brace Other Brace: LLE prosthetic Restrictions Weight Bearing Restrictions:  (pacemaker)      Mobility  Bed Mobility               General bed mobility comments: In chair upon entry    Transfers Overall transfer level: Needs assistance Equipment used: Rolling walker (2 wheeled) Transfers: Sit to/from Stand Sit to Stand: Min assist         General transfer comment: Min A for lift assist and steadying. Cues not to push up through LLE. Pt maintained appropriately after cuing.  Ambulation/Gait Ambulation/Gait assistance: Min guard Gait Distance (Feet): 3 Feet Assistive device: Rolling walker (2 wheeled) Gait Pattern/deviations: Step-through pattern;Decreased stride length Gait velocity: Decreased    General Gait Details: Pt only wanting to go short distance. Cues for not pressing throughout LUE when using RW.  Stairs            Wheelchair Mobility    Modified Rankin (Stroke Patients Only)       Balance Overall balance assessment: Needs assistance Sitting-balance support: No upper extremity supported;Feet supported Sitting balance-Leahy Scale: Fair     Standing balance support: Bilateral upper extremity supported;During functional activity Standing balance-Leahy Scale: Poor Standing balance comment: Reliant on UE support                             Pertinent Vitals/Pain Pain Assessment: Faces Faces Pain Scale: Hurts a little bit Pain Location: incision site Pain Descriptors / Indicators: Operative site guarding Pain Intervention(s): Limited activity within patient's tolerance;Monitored during session;Repositioned    Home Living Family/patient expects to be discharged to:: Skilled nursing facility                      Prior Function Level of Independence: Needs assistance   Gait / Transfers Assistance Needed: Reports staff assists with transfers to [facility]. Reports he has been working with therapy on ambulation.  ADL's / Homemaking Assistance Needed: Reports staff assists with ADL tasks.        Hand Dominance        Extremity/Trunk Assessment   Upper Extremity Assessment Upper Extremity Assessment: LUE deficits/detail LUE Deficits / Details: Maintained pacemaker precautions throughout so shoulder ROM limited    Lower Extremity Assessment Lower Extremity Assessment: Generalized weakness;LLE deficits/detail LLE Deficits /  Details: L BKA at baseline    Cervical / Trunk Assessment Cervical / Trunk Assessment: Kyphotic  Communication   Communication: No difficulties  Cognition Arousal/Alertness: Awake/alert Behavior During Therapy: WFL for tasks assessed/performed Overall Cognitive Status: No family/caregiver present to determine  baseline cognitive functioning                                 General Comments: Likely close to baseline      General Comments      Exercises     Assessment/Plan    PT Assessment Patient needs continued PT services  PT Problem List Decreased strength;Decreased balance;Decreased activity tolerance;Decreased mobility;Decreased knowledge of use of DME;Decreased knowledge of precautions       PT Treatment Interventions DME instruction;Gait training;Functional mobility training;Therapeutic activities;Therapeutic exercise;Balance training;Patient/family education;Wheelchair mobility training    PT Goals (Current goals can be found in the Care Plan section)  Acute Rehab PT Goals Patient Stated Goal: to go back to SNF PT Goal Formulation: With patient Time For Goal Achievement: 03/23/21 Potential to Achieve Goals: Good    Frequency Min 2X/week   Barriers to discharge        Co-evaluation               AM-PAC PT "6 Clicks" Mobility  Outcome Measure Help needed turning from your back to your side while in a flat bed without using bedrails?: A Little Help needed moving from lying on your back to sitting on the side of a flat bed without using bedrails?: A Little Help needed moving to and from a bed to a chair (including a wheelchair)?: A Little Help needed standing up from a chair using your arms (e.g., wheelchair or bedside chair)?: A Little Help needed to walk in hospital room?: A Little Help needed climbing 3-5 steps with a railing? : A Lot 6 Click Score: 17    End of Session Equipment Utilized During Treatment: Gait belt Activity Tolerance: Patient limited by fatigue Patient left: in chair;with call bell/phone within reach Nurse Communication: Mobility status PT Visit Diagnosis: Unsteadiness on feet (R26.81);Muscle weakness (generalized) (M62.81);Difficulty in walking, not elsewhere classified (R26.2)    Time: 3244-0102 PT Time Calculation (min)  (ACUTE ONLY): 19 min   Charges:   PT Evaluation $PT Eval Moderate Complexity: 1 Mod          Reuel Derby, PT, DPT  Acute Rehabilitation Services  Pager: [phone] Office: [phone]   Rudean Hitt 03/09/2021, 11:00 AM

## 2021-03-09 NOTE — Progress Notes (Signed)
Inpatient Diabetes Program Recommendations  AACE/ADA: New Consensus Statement on Inpatient Glycemic Control (2015)  Target Ranges:  Prepandial:   less than 140 mg/dL      Peak postprandial:   less than 180 mg/dL (1-2 hours)      Critically ill patients:  140 - 180 mg/dL   Lab Results  Component Value Date   GLUCAP 227 (H) 03/09/2021   HGBA1C 7.2 (H) 03/06/2021    Review of Glycemic Control Results for Derek Hinton, Derek Hinton (MRN [id number]) as of 03/09/2021 11:15  Ref. Range 03/08/2021 11:33 03/08/2021 16:09 03/08/2021 19:46 03/09/2021 06:52 03/09/2021 11:10  Glucose-Capillary Latest Ref Range: 70 - 99 mg/dL 143 (H) 125 (H) 213 (H) 182 (H) 227 (H)   Diabetes history: DM 2 Outpatient Diabetes medications:  Glucotrol 5 mg bid Metformin 1000 mg bid Farxiga 10 mg daily Current orders for Inpatient glycemic control:  Outpatient Diabetes medications:  Novolog moderate tid with meals Inpatient Diabetes Program Recommendations:   Please add Lantus 10 units daily.   Thanks  Adah Perl, RN, BC-ADM Inpatient Diabetes Coordinator Pager [phone] (8a-5p)

## 2021-03-09 NOTE — Discharge Instructions (Signed)
After Your Pacemaker   . You have a St. Jude Pacemaker  ACTIVITY . Do not lift your arm above shoulder height for 1 week after your procedure. After 7 days, you may progress as below.  . You should remove your sling 24 hours after your procedure, unless otherwise instructed by your provider.     Thursday March 16, 2021  Friday March 17, 2021 Saturday March 18, 2021 Sunday March 19, 2021   . Do not lift, push, pull, or carry anything over 10 pounds with the affected arm until 6 weeks (Thursday April 20, 2021 ) after your procedure.   . You may drive AFTER your wound check, unless you have been told otherwise by your provider.   . Ask your healthcare provider when you can go back to work   INCISION/Dressing . If you are on a blood thinner such as Coumadin, Xarelto, Eliquis, Plavix, or Pradaxa please confirm with your provider when this should be resumed.   . If large square, outer bandage is left in place, this can be removed after 24 hours from your procedure. Do not remove steri-strips or glue as below.   . Monitor your Pacemaker site for redness, swelling, and drainage. Call the device clinic at [phone] if you experience these symptoms or fever/chills.  . If your incision is closed with Dermabond/Surgical glue. You may shower 1 day after your pacemaker implant and wash around the site with soap and water.    Marland Kitchen Avoid lotions, ointments, or perfumes over your incision until it is well-healed.  . You may use a hot tub or a pool AFTER your wound check appointment if the incision is completely closed.  Marland Kitchen PAcemaker Alerts:  Some alerts are vibratory and others beep. These are NOT emergencies. Please call our office to let us know. If this occurs at night or on weekends, it can wait until the next business day. Send a remote transmission.  . If your device is capable of reading fluid status (for heart failure), you will be offered monthly monitoring to review this with you.   DEVICE  MANAGEMENT . Remote monitoring is used to monitor your pacemaker from home. This monitoring is scheduled every 91 days by our office. It allows [place] to keep an eye on the functioning of your device to ensure it is working properly. You will routinely see your Electrophysiologist annually (more often if necessary).   . You should receive your ID card for your new device in 4-8 weeks. Keep this card with you at all times once received. Consider wearing a medical alert bracelet or necklace.  . Your Pacemaker may be MRI compatible. This will be discussed at your next office visit/wound check.  You should avoid contact with strong electric or magnetic fields.    Do not use amateur (ham) radio equipment or electric (arc) welding torches. MP3 player headphones with magnets should not be used. Some devices are safe to use if held at least 12 inches (30 cm) from your Pacemaker. These include power tools, lawn mowers, and speakers. If you are unsure if something is safe to use, ask your health care provider.   When using your cell phone, hold it to the ear that is on the opposite side from the Pacemaker. Do not leave your cell phone in a pocket over the Pacemaker.   You may safely use electric blankets, heating pads, computers, and microwave ovens.  Call the office right away if:  You have chest pain.  You feel more short of breath than you have felt before.  You feel more light-headed than you have felt before.  Your incision starts to open up.  This information is not intended to replace advice given to you by your health care provider. Make sure you discuss any questions you have with your health care provider.

## 2021-03-09 NOTE — Discharge Summary (Addendum)
ELECTROPHYSIOLOGY PROCEDURE DISCHARGE SUMMARY    Patient ID: Derek Hinton,  MRN: [id number], DOB/AGE: 1946-02-09 75 y.o.  Admit date: 03/05/2021 Discharge date: 03/09/2021  Primary Care Physician: Guadalupe Maple, MD  Primary Cardiologist: Sanda Klein, MD  Electrophysiologist: Dr. Sallyanne Kuster  Primary Discharge Diagnosis:  Symptomatic bradycardia due to intermittent CHB status post pacemaker implantation this admission  Secondary Discharge Diagnosis:  AF with slow VR Chronic systolic CHF Deconditioning  Allergies  Allergen Reactions  . Penicillins Other (See Comments)    Pt unsure, states he "didn't feel good" Patient states it made him "feel Funny"      Procedures This Admission:  1.  Implantation of a St. Jude single chamber PPM on 03/08/21 by Dr. Caryl Comes. The patient received a St. Jude model number [id number] PPM with model number 8938-10 right ventricular lead in left bundle position. There were no immediate post procedure complications. 2.  CXR on 03/09/21 demonstrated no pneumothorax status post device implantation.   Brief HPI: Derek Hinton is a 75 y.o. male was admitted for symptomatic bradycardia and chest discomfort and electrophysiology team asked to see for consideration of PPM implantation.  Past medical history includes above.  The patient has had symptomatic bradycardia without reversible causes identified.  Risks, benefits, and alternatives to PPM implantation were reviewed with the patient who wished to proceed.   Hospital Course:  The patient was admitted and underwent implantation of a St. Jude single chamber PPM with details as outlined above.  He was monitored on telemetry overnight which demonstrated appropriate pacing.  Left chest was without hematoma or ecchymosis.  The device was interrogated and found to be functioning normally.  CXR was obtained and demonstrated no pneumothorax status post device implantation.  Wound care, arm mobility, and  restrictions were reviewed with the patient.  The patient was examined and considered stable for discharge to home.    Physical Exam: Vitals:   03/09/21 0800 03/09/21 0848 03/09/21 0900 03/09/21 1109  BP:  130/64 114/62   Pulse: 61 (!) 51 (!) 49   Resp: (!) 27 (!) 23 13   Temp:    98 F (36.7 C)  TempSrc:    Oral  SpO2: 97% 97% 98%   Weight:      Height:        GEN- The patient is well appearing, alert and oriented x 3 today.   HEENT: normocephalic, atraumatic; sclera clear, conjunctiva pink; hearing intact; oropharynx clear; neck supple, no JVP Lymph- no cervical lymphadenopathy Lungs- Clear to ausculation bilaterally, normal work of breathing.  No wheezes, rales, rhonchi Heart- Regular rate and rhythm, no murmurs, rubs or gallops, PMI not laterally displaced GI- soft, non-tender, non-distended, bowel sounds present, no hepatosplenomegaly Extremities- no clubbing, cyanosis, or edema; DP/PT/radial pulses 2+ bilaterally MS- no significant deformity or atrophy Skin- warm and dry, no rash or lesion, left chest without hematoma/ecchymosis Psych- euthymic mood, full affect Neuro- strength and sensation are intact   Labs:   Lab Results  Component Value Date   WBC 9.0 03/06/2021   HGB 10.6 (L) 03/06/2021   HCT 33.4 (L) 03/06/2021   MCV 97.7 03/06/2021   PLT 124 (L) 03/06/2021    Recent Labs  Lab 03/06/21 0013 03/08/21 0025 03/09/21 0051  NA 140   < > 135  K 4.5   < > 5.1  CL 105   < > 107  CO2 26   < > 18*  BUN 57*   < > 73*  CREATININE 1.56*   < >  1.72*  CALCIUM 9.0   < > 8.7*  PROT 6.4*  --   --   BILITOT 0.6  --   --   ALKPHOS 98  --   --   ALT 58*  --   --   AST 76*  --   --   GLUCOSE 99   < > 208*   < > = values in this interval not displayed.    Discharge Medications:  Allergies as of 03/09/2021      Reactions   Penicillins Other (See Comments)   Pt unsure, states he "didn't feel good" Patient states it made him "feel Funny"      Medication List     TAKE these medications   acetaminophen 325 MG tablet Commonly known as: TYLENOL Take 1-2 tablets (325-650 mg total) by mouth every 4 (four) hours as needed for mild pain.   aspirin 81 MG tablet Take 1 tablet (81 mg total) by mouth daily. Start taking on: March 10, 2021   atorvastatin 40 MG tablet Commonly known as: LIPITOR Take 1 tablet (40 mg total) by mouth daily.   Breo Ellipta 100-25 MCG/INH Aepb Generic drug: fluticasone furoate-vilanterol Inhale 2 puffs into the lungs as directed.   Farxiga 10 MG Tabs tablet Generic drug: dapagliflozin propanediol Take 10 mg by mouth daily.   furosemide 40 MG tablet Commonly known as: LASIX Take 80mg  in the morning and 40mg  in the pm What changed:   how much to take  how to take this  when to take this  additional instructions   glipiZIDE 5 MG tablet Commonly known as: GLUCOTROL Take 5 mg by mouth 2 (two) times daily.   iron polysaccharides 150 MG capsule Commonly known as: NIFEREX Take 150 mg by mouth 2 (two) times daily.   metFORMIN 1000 MG tablet Commonly known as: GLUCOPHAGE Take 1,000 mg by mouth 2 (two) times daily.   metoprolol succinate 50 MG 24 hr tablet Commonly known as: TOPROL-XL Take 1 tablet (50 mg total) by mouth daily. Take with or immediately following a meal.   multivitamin with minerals Tabs tablet Take 1 tablet by mouth daily.   sacubitril-valsartan 49-51 MG Commonly known as: Entresto Take 1 tablet by mouth 2 (two) times daily.   sodium bicarbonate 650 MG tablet Take 650 mg by mouth 2 (two) times daily.       Disposition:    Follow-up Information    [facility] Follow up.   Why: on 6/14 at 2 pm for post hospital pacemaker assessment Contact information: [place] [address] [phone]              Duration of Discharge Encounter: Greater than 30 minutes including physician  time.  Jacalyn Lefevre, PA-C  03/09/2021 12:13 PM

## 2021-03-09 NOTE — TOC Progression Note (Addendum)
Transition of Care [facility]) - Progression Note    Patient Details  Name: Derek Hinton MRN: [id number] Date of Birth: [birth year]  Transition of Care [facility]) CM/SW [place], [place] Phone Number: 03/09/2021, 11:36 AM  Clinical Narrative:     CSW informed pt ready for DC back to LTC SNF. CSW [employer] with [facility]. They can accept pt today once DC summary received and covid test results. DC to be faxed to [phone]. Karena Addison can be texted at [phone]. CSW will continue to follow for DC.   Expected Discharge Plan: [place] Barriers to Discharge: Continued Medical Work up  Expected Discharge Plan and Services Expected Discharge Plan: [place] In-house Referral: Clinical Social Work     Living arrangements for the past 2 months: [place]                                       Social Determinants of Health (SDOH) Interventions    Readmission Risk Interventions No flowsheet data found.

## 2021-03-09 NOTE — Progress Notes (Addendum)
Electrophysiology Rounding Note  Patient Name: Derek Hinton Date of Encounter: 03/09/2021  Primary Cardiologist: Sanda Klein, MD Electrophysiologist: Dr. Caryl Comes -> Dr. Sallyanne Kuster   Subjective   The patient is doing well today.  At this time, the patient denies chest pain, shortness of breath, or any new concerns. Mildly sore.   Inpatient Medications    Scheduled Meds:  aspirin  81 mg Oral Daily   atorvastatin  40 mg Oral Daily   Chlorhexidine Gluconate Cloth  6 each Topical Daily   insulin aspart  0-15 Units Subcutaneous TID WC   pantoprazole  40 mg Oral Daily   Continuous Infusions:  sodium chloride 5.57 mL/hr at 03/08/21 0004   DOPamine 2.5 mcg/kg/min (03/09/21 0700)   lactated ringers     PRN Meds: acetaminophen, ALPRAZolam, atropine, docusate sodium, ipratropium-albuterol, ondansetron (ZOFRAN) IV, polyethylene glycol   Vital Signs    Vitals:   03/09/21 0630 03/09/21 0645 03/09/21 0700 03/09/21 0715  BP: (!) 148/86 138/84  (!) 108/53  Pulse: (!) 37 62 (!) 58 (!) 56  Resp: 19 15 15 18   Temp:   97.6 F (36.4 C)   TempSrc:   Oral   SpO2: (!) 82% 99% 99% 96%  Weight:      Height:        Intake/Output Summary (Last 24 hours) at 03/09/2021 0810 Last data filed at 03/09/2021 0700 Gross per 24 hour  Intake 233.2 ml  Output 121 ml  Net 112.2 ml   Filed Weights   03/07/21 0630 03/08/21 0500 03/09/21 0500  Weight: 59.4 kg 60.1 kg 62.6 kg    Physical Exam    GEN- The patient is well appearing, alert and oriented x 3 today.   Head- normocephalic, atraumatic Eyes-  Sclera clear, conjunctiva pink Ears- hearing intact Oropharynx- clear Neck- supple Lungs- Clear to ausculation bilaterally, normal work of breathing Heart- Regular rate and rhythm, no murmurs, rubs or gallops GI- soft, NT, ND, + BS Extremities- no clubbing or cyanosis. No edema Skin- no rash or lesion Psych- euthymic mood, full affect Neuro- strength and sensation are intact  Labs     CBC No results for input(s): WBC, NEUTROABS, HGB, HCT, MCV, PLT in the last 72 hours. Basic Metabolic Panel Recent Labs    03/08/21 1559 03/09/21 0051  NA 136 135  K 5.4* 5.1  CL 106 107  CO2 19* 18*  GLUCOSE 137* 208*  BUN 70* 73*  CREATININE 1.68* 1.72*  CALCIUM 8.5* 8.7*   Liver Function Tests No results for input(s): AST, ALT, ALKPHOS, BILITOT, PROT, ALBUMIN in the last 72 hours. No results for input(s): LIPASE, AMYLASE in the last 72 hours. Cardiac Enzymes No results for input(s): CKTOTAL, CKMB, CKMBINDEX, TROPONINI in the last 72 hours.   Telemetry    NSR with intermittent V pacing (personally reviewed)  Radiology    DG Chest 2 View  Result Date: 03/09/2021 CLINICAL DATA:  Cardiac device in situ. EXAM: CHEST - 2 VIEW COMPARISON:  Chest x-ray 03/05/2021, 07/06/2020. FINDINGS: Cardiac pacer noted with lead tip over the right ventricle. Heart size normal. Persistent prominent left lung pleuroparenchymal thickening consistent with scarring. No pneumothorax. Prominent bilateral upper extremity peripheral and carotid vascular calcification. IMPRESSION: 1. Cardiac pacer noted with lead tip over the right ventricle. No pneumothorax. 2. Persistent prominent left lung pleuroparenchymal thickening consistent with scarring. 3. Prominent changes of bilateral upper extremity peripheral and carotid atherosclerotic vascular disease. Electronically Signed   By: Marcello Moores  Register   On: 03/09/2021 06:23  Patient Profile     Derek Hinton is a 75 y.o. male with a history of chronic combined CHF, ICM with EF 15-20%, CAD with 3V disease not CABG candidate, PAD s/p R iliac PTA and L BKA, persistent AF not on [place], ischemic CVA, DM2, and COPD admitted with severe symptomatic bradycardia who is being seen today for the evaluation of symptomatic bradycardia at the request of Dr. Sallyanne Kuster.  Assessment & Plan    1. Intermittent CHB Now s/p St Jude single chamber PPM 03/08/21 CXR looks  OK Device interrogation performed. R waves small but small at implant and sensed and capturing well. Will follow.  Stop dobutamine  2. Atrial fibrillation with slow ventricular rates Can add back any rate control as needed now with pacing in place in left bundle position Not on [place] due to prior bleeding despite CHA2DS2VASC of at least 6.    3. Chronic systolic CHF EF <88% 06/05/99 Now with single chamber PPM in place with left bundle pacing.  Volume status stable on exam.   4. Mobility Will ask PT to see for strategies to help him move with arm restrictions and prosthetic.     Dr. Sallyanne Kuster to see. Possibly for home today.  For questions or updates, please contact [facility] Please consult [email or web address] for contact info under Cardiology/STEMI.  Signed, Shirley Friar, PA-C  03/09/2021, 8:10 AM   I have seen and examined the patient along with Shirley Friar, PA-C .  I have reviewed the chart, notes and new data.  I agree with PA/NP's note.  Key new complaints: feels well Key examination changes: healthy PM site Key new findings / data: sensed R waves only 1.5 mV, but sensing appropriately. Currently V pacing at 50, occasional native AV conduction in low 50s. CXR - no complications.  PLAN: Waiting for PT evaluation for education re: mobility/transfers w BKA and recent PM lead implantation. Plan DC later today.  Sanda Klein, MD, [place] [phone] 03/09/2021, 8:57 AM

## 2021-03-09 NOTE — TOC Transition Note (Signed)
Transition of Care [facility]) - CM/SW Discharge Note   Patient Details  Name: Anson Peddie MRN: [id number] Date of Birth: 12-27-1945  Transition of Care [facility]) CM/SW Contact:  Bethann Berkshire, [place] Phone Number: 03/09/2021, 1:42 PM   Clinical Narrative:     CSW just notified that pt would need to arrive by 5pm. CSW called [facility]. As of 1340 [facility] stated they were about 1.5 hours out from pickup.   Patient will DC to: [facility] Anticipated DC date: 03/09/21 Transport by: Corey Harold   Per MD patient ready for DC to . RN, patient, and facility notified of DC. Discharge Summary and FL2 sent to facility. RN to call report prior to discharge [phone]. Ask for [facility] Nurse room 57-2). DC packet on chart. Ambulance transport requested for patient.   CSW will sign off for now as social work intervention is no longer needed. Please consult [place] again if new needs arise.   Final next level of care: Skilled Nursing Facility Barriers to Discharge: No Barriers Identified   Patient Goals and CMS Choice Patient states their goals for this hospitalization and ongoing recovery are:: to go to SNF CMS Medicare.gov Compare Post Acute Care list provided to:: Patient Choice offered to / list presented to : Patient  Discharge Placement              Patient chooses bed at:  [facility]) Patient to be transferred to facility by: [facility]   Patient and family notified of of transfer: 03/09/21  Discharge Plan and Services In-house Referral: Clinical Social Work                                   Social Determinants of Health ([place]) Interventions     Readmission Risk Interventions No flowsheet data found.

## 2021-03-10 LAB — CULTURE, BLOOD (ROUTINE X 2)
Culture: NO GROWTH
Culture: NO GROWTH

## 2021-03-11 ENCOUNTER — Encounter (HOSPITAL_COMMUNITY): Payer: Self-pay

## 2021-03-11 ENCOUNTER — Emergency Department (HOSPITAL_COMMUNITY): Payer: Medicare Other

## 2021-03-11 ENCOUNTER — Other Ambulatory Visit: Payer: Self-pay

## 2021-03-11 ENCOUNTER — Emergency Department (HOSPITAL_COMMUNITY)
Admission: EM | Admit: 2021-03-11 | Discharge: 2021-04-07 | Disposition: E | Payer: Medicare Other | Attending: Emergency Medicine | Admitting: Emergency Medicine

## 2021-03-11 DIAGNOSIS — E876 Hypokalemia: Secondary | ICD-10-CM | POA: Insufficient documentation

## 2021-03-11 DIAGNOSIS — I469 Cardiac arrest, cause unspecified: Secondary | ICD-10-CM | POA: Diagnosis not present

## 2021-03-11 DIAGNOSIS — I2510 Atherosclerotic heart disease of native coronary artery without angina pectoris: Secondary | ICD-10-CM | POA: Insufficient documentation

## 2021-03-11 DIAGNOSIS — N183 Chronic kidney disease, stage 3 unspecified: Secondary | ICD-10-CM | POA: Diagnosis not present

## 2021-03-11 DIAGNOSIS — J449 Chronic obstructive pulmonary disease, unspecified: Secondary | ICD-10-CM | POA: Insufficient documentation

## 2021-03-11 DIAGNOSIS — R42 Dizziness and giddiness: Secondary | ICD-10-CM | POA: Diagnosis not present

## 2021-03-11 DIAGNOSIS — C439 Malignant melanoma of skin, unspecified: Secondary | ICD-10-CM | POA: Diagnosis not present

## 2021-03-11 DIAGNOSIS — Z950 Presence of cardiac pacemaker: Secondary | ICD-10-CM | POA: Insufficient documentation

## 2021-03-11 DIAGNOSIS — E1122 Type 2 diabetes mellitus with diabetic chronic kidney disease: Secondary | ICD-10-CM | POA: Diagnosis not present

## 2021-03-11 DIAGNOSIS — E875 Hyperkalemia: Secondary | ICD-10-CM | POA: Diagnosis not present

## 2021-03-11 DIAGNOSIS — Z7984 Long term (current) use of oral hypoglycemic drugs: Secondary | ICD-10-CM | POA: Insufficient documentation

## 2021-03-11 DIAGNOSIS — Z87891 Personal history of nicotine dependence: Secondary | ICD-10-CM | POA: Insufficient documentation

## 2021-03-11 DIAGNOSIS — Z79899 Other long term (current) drug therapy: Secondary | ICD-10-CM | POA: Insufficient documentation

## 2021-03-11 DIAGNOSIS — Z7982 Long term (current) use of aspirin: Secondary | ICD-10-CM | POA: Insufficient documentation

## 2021-03-11 DIAGNOSIS — I110 Hypertensive heart disease with heart failure: Secondary | ICD-10-CM | POA: Insufficient documentation

## 2021-03-11 DIAGNOSIS — N179 Acute kidney failure, unspecified: Secondary | ICD-10-CM | POA: Diagnosis not present

## 2021-03-11 DIAGNOSIS — I509 Heart failure, unspecified: Secondary | ICD-10-CM | POA: Diagnosis not present

## 2021-03-11 DIAGNOSIS — I129 Hypertensive chronic kidney disease with stage 1 through stage 4 chronic kidney disease, or unspecified chronic kidney disease: Secondary | ICD-10-CM | POA: Diagnosis not present

## 2021-03-11 DIAGNOSIS — I9589 Other hypotension: Secondary | ICD-10-CM | POA: Diagnosis not present

## 2021-03-11 DIAGNOSIS — T68XXXA Hypothermia, initial encounter: Secondary | ICD-10-CM | POA: Insufficient documentation

## 2021-03-11 DIAGNOSIS — E119 Type 2 diabetes mellitus without complications: Secondary | ICD-10-CM | POA: Insufficient documentation

## 2021-03-11 DIAGNOSIS — X31XXXA Exposure to excessive natural cold, initial encounter: Secondary | ICD-10-CM | POA: Insufficient documentation

## 2021-03-11 DIAGNOSIS — I5042 Chronic combined systolic (congestive) and diastolic (congestive) heart failure: Secondary | ICD-10-CM | POA: Insufficient documentation

## 2021-03-11 DIAGNOSIS — E118 Type 2 diabetes mellitus with unspecified complications: Secondary | ICD-10-CM | POA: Diagnosis not present

## 2021-03-11 DIAGNOSIS — R531 Weakness: Secondary | ICD-10-CM | POA: Diagnosis not present

## 2021-03-11 DIAGNOSIS — D61818 Other pancytopenia: Secondary | ICD-10-CM | POA: Diagnosis not present

## 2021-03-11 LAB — I-STAT CHEM 8, ED
BUN: >130 mg/dL — ABNORMAL HIGH (ref 8–23)
Calcium, Ion: 1.07 mmol/L — ABNORMAL LOW (ref 1.15–1.40)
Chloride: 105 mmol/L (ref 98–111)
Creatinine, Ser: 3.8 mg/dL — ABNORMAL HIGH (ref 0.61–1.24)
Glucose, Bld: 277 mg/dL — ABNORMAL HIGH (ref 70–99)
HCT: 34 % — ABNORMAL LOW (ref 39.0–52.0)
Hemoglobin: 11.6 g/dL — ABNORMAL LOW (ref 13.0–17.0)
Potassium: 7.2 mmol/L (ref 3.5–5.1)
Sodium: 132 mmol/L — ABNORMAL LOW (ref 135–145)
TCO2: 11 mmol/L — ABNORMAL LOW (ref 22–32)

## 2021-03-11 LAB — CBC WITH DIFFERENTIAL/PLATELET
Abs Immature Granulocytes: 0.18 10*3/uL — ABNORMAL HIGH (ref 0.00–0.07)
Basophils Absolute: 0 10*3/uL (ref 0.0–0.1)
Basophils Relative: 0 %
Eosinophils Absolute: 0 10*3/uL (ref 0.0–0.5)
Eosinophils Relative: 0 %
HCT: 37.1 % — ABNORMAL LOW (ref 39.0–52.0)
Hemoglobin: 10.7 g/dL — ABNORMAL LOW (ref 13.0–17.0)
Immature Granulocytes: 1 %
Lymphocytes Relative: 6 %
Lymphs Abs: 1 10*3/uL (ref 0.7–4.0)
MCH: 31 pg (ref 26.0–34.0)
MCHC: 28.8 g/dL — ABNORMAL LOW (ref 30.0–36.0)
MCV: 107.5 fL — ABNORMAL HIGH (ref 80.0–100.0)
Monocytes Absolute: 1.7 10*3/uL — ABNORMAL HIGH (ref 0.1–1.0)
Monocytes Relative: 11 %
Neutro Abs: 12.5 10*3/uL — ABNORMAL HIGH (ref 1.7–7.7)
Neutrophils Relative %: 82 %
Platelets: 94 10*3/uL — ABNORMAL LOW (ref 150–400)
RBC: 3.45 MIL/uL — ABNORMAL LOW (ref 4.22–5.81)
RDW: 15.2 % (ref 11.5–15.5)
WBC: 15.5 10*3/uL — ABNORMAL HIGH (ref 4.0–10.5)
nRBC: 0.6 % — ABNORMAL HIGH (ref 0.0–0.2)

## 2021-03-11 LAB — COMPREHENSIVE METABOLIC PANEL
ALT: 1153 U/L — ABNORMAL HIGH (ref 0–44)
AST: 1316 U/L — ABNORMAL HIGH (ref 15–41)
Albumin: 3.4 g/dL — ABNORMAL LOW (ref 3.5–5.0)
Alkaline Phosphatase: 179 U/L — ABNORMAL HIGH (ref 38–126)
BUN: 104 mg/dL — ABNORMAL HIGH (ref 8–23)
CO2: <7 mmol/L — ABNORMAL LOW (ref 22–32)
Calcium: 8.3 mg/dL — ABNORMAL LOW (ref 8.9–10.3)
Chloride: 101 mmol/L (ref 98–111)
Creatinine, Ser: 3.74 mg/dL — ABNORMAL HIGH (ref 0.61–1.24)
GFR, Estimated: 16 mL/min — ABNORMAL LOW (ref >60)
Glucose, Bld: 296 mg/dL — ABNORMAL HIGH (ref 70–99)
Potassium: 7.2 mmol/L (ref 3.5–5.1)
Sodium: 133 mmol/L — ABNORMAL LOW (ref 135–145)
Total Bilirubin: 1.4 mg/dL — ABNORMAL HIGH (ref 0.3–1.2)
Total Protein: 6.6 g/dL (ref 6.5–8.1)

## 2021-03-11 LAB — PROTIME-INR
INR: 2 — ABNORMAL HIGH (ref 0.8–1.2)
Prothrombin Time: 22.6 seconds — ABNORMAL HIGH (ref 11.4–15.2)

## 2021-03-11 LAB — LACTIC ACID, PLASMA: Lactic Acid, Venous: >11 mmol/L (ref 0.5–1.9)

## 2021-03-11 LAB — CBG MONITORING, ED: Glucose-Capillary: 242 mg/dL — ABNORMAL HIGH (ref 70–99)

## 2021-03-11 LAB — TROPONIN I (HIGH SENSITIVITY): Troponin I (High Sensitivity): 3671 ng/L (ref <18)

## 2021-03-11 MED ORDER — VANCOMYCIN HCL 1250 MG/250ML IV SOLN
1250.0000 mg | Freq: Once | INTRAVENOUS | Status: DC
  Filled 2021-03-11: qty 250

## 2021-03-11 MED ORDER — INSULIN ASPART 100 UNIT/ML IV SOLN: 5.0000 [IU] | Freq: Once | INTRAVENOUS | Status: DC

## 2021-03-11 MED ORDER — SODIUM CHLORIDE 0.9 % IV SOLN
2.0000 g | Freq: Once | INTRAVENOUS | Status: AC
  Administered 2021-03-11: 2 g via INTRAVENOUS
  Filled 2021-03-11: qty 2

## 2021-03-11 MED ORDER — SODIUM BICARBONATE 8.4 % IV SOLN
50.0000 meq | Freq: Once | INTRAVENOUS | Status: AC
  Administered 2021-03-11: 50 meq via INTRAVENOUS
  Filled 2021-03-11: qty 50

## 2021-03-11 MED ORDER — VANCOMYCIN HCL 750 MG/150ML IV SOLN: 750.0000 mg | INTRAVENOUS | Status: DC

## 2021-03-11 MED ORDER — CALCIUM GLUCONATE-NACL 1-0.675 GM/50ML-% IV SOLN
1.0000 g | Freq: Once | INTRAVENOUS | Status: AC
  Administered 2021-03-11: 1000 mg via INTRAVENOUS
  Filled 2021-03-11: qty 50

## 2021-03-11 MED ORDER — SODIUM CHLORIDE 0.9 % IV SOLN
2.0000 g | Freq: Two times a day (BID) | INTRAVENOUS | Status: DC
  Filled 2021-03-11: qty 2

## 2021-03-16 LAB — CULTURE, BLOOD (ROUTINE X 2): Culture: NO GROWTH

## 2021-03-21 ENCOUNTER — Ambulatory Visit: Payer: Medicare Other

## 2021-04-07 NOTE — ED Notes (Signed)
Pt took off bear hugger and blankets; he said he is hot. Bear hugger seting was lowered down but pt still took it off.

## 2021-04-07 NOTE — ED Notes (Signed)
Unsuccessful IV stick. IV team consulted. Dr. Francia Greaves notified.

## 2021-04-07 NOTE — ED Provider Notes (Signed)
[facility] Provider Note   CSN: [id number] Arrival date & time: 2021/04/05  1050     History No chief complaint on file.   Derek [facility] is a 75 y.o. male.  75 year old male with prior medical history as detailed below presents for evaluation.  Patient was recently admitted.  Patient initially presented last week in complete heart block.  Patient required intubation.  Patient had PPM placed by cardiology.   Patient was discharged back to his facility after recent admission.  Patient returns today with complaint of generalized weakness and fatigue.  He denies chest pain or shortness of breath. Symptoms apparently began during admission, per patient's report.  Patient reconfirms DNR/DNI to this provider.    The history is provided by the patient and medical records.  Illness Location:  Malaise, fatigue, weakness Severity:  Moderate Onset quality:  Gradual Timing:  Constant Progression:  Worsening Chronicity:  New Associated symptoms: no chest pain        Past Medical History:  Diagnosis Date  . COPD (chronic obstructive pulmonary disease) (HCC)    not on home O2  . Diabetes mellitus without complication ([place])   . Hypertension   . PVD (peripheral vascular disease) ([place])    s/p L BKA    Patient Active Problem List   Diagnosis Date Noted  . Respiratory arrest ([place])   . Acute respiratory failure ([place]) 03/05/2021  . AV block, 3rd degree (HCC)   . Cardiogenic shock ([place])   . Left below-knee amputee ([place]) 06/12/2019  . Pressure injury of skin 07/21/2018  . Coronary artery disease involving native coronary artery of native heart without angina pectoris   . Chronic combined systolic and diastolic heart failure ([place])   . Malnutrition of moderate degree 07/18/2018  . Acute encephalopathy 07/16/2018  . Lower limb ischemia 07/16/2018  . PVD (peripheral vascular disease) ([place]) 07/16/2018  . COPD (chronic obstructive pulmonary disease)  ([place]) 07/16/2018  . Essential hypertension 07/16/2018  . Type 2 diabetes mellitus with peripheral vascular disease ([place]) 07/16/2018  . Persistent atrial fibrillation ([place]) 07/16/2018    Past Surgical History:  Procedure Laterality Date  . ABDOMINAL AORTOGRAM W/LOWER EXTREMITY N/A 07/18/2018   Procedure: ABDOMINAL AORTOGRAM W/LOWER EXTREMITY;  Surgeon: Angelia Mould, MD;  Location: [place] [facility];  Service: Cardiovascular;  Laterality: N/A;  . BELOW KNEE LEG AMPUTATION    . PACEMAKER IMPLANT N/A 03/08/2021   Procedure: PACEMAKER IMPLANT;  Surgeon: Deboraha Sprang, MD;  Location: [place] [facility];  Service: Cardiovascular;  Laterality: N/A;  . PERIPHERAL VASCULAR INTERVENTION Right 07/18/2018   Procedure: PERIPHERAL VASCULAR INTERVENTION;  Surgeon: Angelia Mould, MD;  Location: [place] [facility];  Service: Cardiovascular;  Laterality: Right;  . RIGHT/LEFT HEART CATH AND CORONARY ANGIOGRAPHY N/A 07/21/2018   Procedure: RIGHT/LEFT HEART CATH AND CORONARY ANGIOGRAPHY;  Surgeon: Jettie Booze, MD;  Location: [place] [facility];  Service: Cardiovascular;  Laterality: N/A;       Family History  Problem Relation Age of Onset  . Diabetes Father     Social History   Tobacco Use  . Smoking status: Former Smoker    Packs/day: 1.00    Years: 60.00    Pack years: 60.00    Quit date: 12/2017    Years since quitting: 3.2  . Smokeless tobacco: Never Used  Substance Use Topics  . Alcohol use: Not Currently  . Drug use: Never    Home Medications Prior to Admission medications  Medication Sig Start Date End Date Taking? Authorizing Provider  acetaminophen (TYLENOL) 325 MG tablet Take 1-2 tablets (325-650 mg total) by mouth every 4 (four) hours as needed for mild pain. Patient taking differently: Take 650 mg by mouth every 4 (four) hours as needed (for pain). 03/09/21  Yes Shirley Friar, PA-C  aspirin 81 MG tablet Take 1 tablet (81 mg total) by mouth  daily. 03/10/21  Yes Shirley Friar, PA-C  atorvastatin (LIPITOR) 40 MG tablet Take 1 tablet (40 mg total) by mouth daily. Patient taking differently: Take 40 mg by mouth daily at 6 PM. 07/24/18  Yes Ezenduka, Adline Peals, MD  FARXIGA 10 MG TABS tablet Take 10 mg by mouth daily. 05/22/19  Yes [provider]  FERREX 150 150 MG capsule Take 150 mg by mouth 2 (two) times daily.   Yes [provider]  furosemide (LASIX) 40 MG tablet Take 80mg  in the morning and 40mg  in the pm Patient taking differently: Take 40 mg by mouth daily. 08/21/18  Yes Almyra Deforest, PA  glipiZIDE (GLUCOTROL) 5 MG tablet Take 5 mg by mouth 2 (two) times daily. 02/27/21  Yes [provider]  metFORMIN (GLUCOPHAGE) 1000 MG tablet Take 1,000 mg by mouth 2 (two) times daily. 12/18/17  Yes [provider]  metoprolol succinate (TOPROL-XL) 50 MG 24 hr tablet Take 1 tablet (50 mg total) by mouth daily. Take with or immediately following a meal. 08/21/18 07/20/20 Yes Meng, [place], PA  Multiple Vitamin (MULTIVITAMIN WITH MINERALS) TABS tablet Take 1 tablet by mouth daily. 07/25/18  Yes Alma Friendly, MD  sacubitril-valsartan (ENTRESTO) 49-51 MG Take 1 tablet by mouth 2 (two) times daily. 08/12/18  Yes Almyra Deforest, PA  sodium bicarbonate 650 MG tablet Take 650 mg by mouth 2 (two) times daily. 07/09/20  Yes [provider]  BREO ELLIPTA 100-25 MCG/INH AEPB Inhale 2 puffs into the lungs as directed. Patient not taking: Reported on 2021/04/07 08/28/18   [provider]  iron polysaccharides (NIFEREX) 150 MG capsule Take 150 mg by mouth 2 (two) times daily. Patient not taking: Reported on 04/07/21    [provider]    Allergies    Penicillins and Bactrim [sulfamethoxazole-trimethoprim]  Review of Systems   Review of Systems  Cardiovascular: Negative for chest pain.  All other systems reviewed and are negative.   Physical Exam Updated Vital Signs BP 102/86   Pulse (!)  51   Temp (!) 95.7 F (35.4 C) (Rectal)   Resp 18   Ht 5\' 7"  (1.702 m)   SpO2 92%   BMI 21.61 kg/m   Physical Exam Vitals and nursing note reviewed.  Constitutional:      General: He is not in acute distress.    Appearance: He is well-developed. He is ill-appearing.  HENT:     Head: Normocephalic and atraumatic.  Eyes:     Conjunctiva/sclera: Conjunctivae normal.     Pupils: Pupils are equal, round, and reactive to light.  Cardiovascular:     Rate and Rhythm: Normal rate and regular rhythm.     Heart sounds: Normal heart sounds.  Pulmonary:     Effort: Pulmonary effort is normal. No respiratory distress.     Breath sounds: Normal breath sounds.  Abdominal:     General: There is no distension.     Palpations: Abdomen is soft.     Tenderness: There is no abdominal tenderness.  Musculoskeletal:        General: No deformity.  Normal range of motion.     Cervical back: Normal range of motion and neck supple.  Skin:    General: Skin is dry.     Comments: Cool extremities   Neurological:     General: No focal deficit present.     Mental Status: He is alert and oriented to person, place, and time. Mental status is at baseline.     ED Results / Procedures / Treatments   Labs (all labs ordered are listed, but only abnormal results are displayed) Labs Reviewed  COMPREHENSIVE METABOLIC PANEL - Abnormal; Notable for the following components:      Result Value   Sodium 133 (*)    Potassium 7.2 (*)    CO2 <7 (*)    Glucose, Bld 296 (*)    BUN 104 (*)    Creatinine, Ser 3.74 (*)    Calcium 8.3 (*)    Albumin 3.4 (*)    AST 1,316 (*)    ALT 1,153 (*)    Alkaline Phosphatase 179 (*)    Total Bilirubin 1.4 (*)    GFR, Estimated 16 (*)    All other components within normal limits  CBC WITH DIFFERENTIAL/PLATELET - Abnormal; Notable for the following components:   WBC 15.5 (*)    RBC 3.45 (*)    Hemoglobin 10.7 (*)    HCT 37.1 (*)    MCV 107.5 (*)    MCHC 28.8 (*)     Platelets 94 (*)    nRBC 0.6 (*)    Neutro Abs 12.5 (*)    Monocytes Absolute 1.7 (*)    Abs Immature Granulocytes 0.18 (*)    All other components within normal limits  PROTIME-INR - Abnormal; Notable for the following components:   Prothrombin Time 22.6 (*)    INR 2.0 (*)    All other components within normal limits  I-STAT CHEM 8, ED - Abnormal; Notable for the following components:   Sodium 132 (*)    Potassium 7.2 (*)    BUN >130 (*)    Creatinine, Ser 3.80 (*)    Glucose, Bld 277 (*)    Calcium, Ion 1.07 (*)    TCO2 11 (*)    Hemoglobin 11.6 (*)    HCT 34.0 (*)    All other components within normal limits  CBG MONITORING, ED - Abnormal; Notable for the following components:   Glucose-Capillary 242 (*)    All other components within normal limits  TROPONIN I (HIGH SENSITIVITY) - Abnormal; Notable for the following components:   Troponin I (High Sensitivity) 3,671 (*)    All other components within normal limits  CULTURE, BLOOD (ROUTINE X 2)  CULTURE, BLOOD (ROUTINE X 2)  URINE CULTURE  RESP PANEL BY RT-PCR (FLU A&B, COVID) ARPGX2  LACTIC ACID, PLASMA  LACTIC ACID, PLASMA  URINALYSIS, ROUTINE W REFLEX MICROSCOPIC  BRAIN NATRIURETIC PEPTIDE  I-STAT ARTERIAL BLOOD GAS, ED  TYPE AND SCREEN  TROPONIN I (HIGH SENSITIVITY)    EKG EKG Interpretation  Date/Time:  03-14-21 13:50:41 EDT Ventricular Rate:  0 PR Interval:    QRS Duration: 136 QT Interval:  528 QTC Calculation: 481 R Axis:   0 Text Interpretation: Post PEA arrest, paced rhythm No further analysis attempted - not enough leads could be measured Confirmed by Dene Gentry [phone]) on 14-Mar-2021 2:07:14 PM   Radiology DG Chest Port 1 View  Result Date: Mar 14, 2021 CLINICAL DATA:  Weakness, dizziness. EXAM: PORTABLE CHEST 1 VIEW COMPARISON:  Chest  radiograph dated 03/09/2021. FINDINGS: The heart remains enlarged. Vascular calcifications are seen in the aortic arch. A left subclavian cardiac device  is redemonstrated. Left lower lung pleuroparenchymal scarring/thickening is unchanged. There is mild left basilar atelectasis/airspace disease. The right lung is clear, however the right costophrenic angle is not imaged and a small right pleural effusion cannot be excluded. There is no pneumothorax. The osseous structures appear intact. IMPRESSION: Mild left basilar atelectasis/airspace disease. Aortic Atherosclerosis (ICD10-I70.0). Electronically Signed   By: Zerita Boers M.D.   On: 03/30/2021 12:42    Procedures Procedures  CRITICAL CARE Performed by: Valarie Merino   Total critical care time: 45 minutes  Critical care time was exclusive of separately billable procedures and treating other patients.  Critical care was necessary to treat or prevent imminent or life-threatening deterioration.  Critical care was time spent personally by me on the following activities: development of treatment plan with patient and/or surrogate as well as nursing, discussions with consultants, evaluation of patient's response to treatment, examination of patient, obtaining history from patient or surrogate, ordering and performing treatments and interventions, ordering and review of laboratory studies, ordering and review of radiographic studies, pulse oximetry and re-evaluation of patient's condition.   Medications Ordered in ED Medications  vancomycin (VANCOREADY) IVPB 1250 mg/250 mL (has no administration in time range)  ceFEPIme (MAXIPIME) 2 g in sodium chloride 0.9 % 100 mL IVPB (has no administration in time range)  vancomycin (VANCOREADY) IVPB 750 mg/150 mL (has no administration in time range)  insulin aspart (novoLOG) injection 5 Units (has no administration in time range)  ceFEPIme (MAXIPIME) 2 g in sodium chloride 0.9 % 100 mL IVPB (0 g Intravenous Stopped 03/30/2021 1259)  sodium bicarbonate injection 50 mEq (50 mEq Intravenous Given 03-30-2021 1310)  calcium gluconate 1 g/ 50 mL sodium chloride IVPB  (1,000 mg Intravenous New Bag/Given March 30, 2021 1310)    ED Course  I have reviewed the triage vital signs and the nursing notes.  Pertinent labs & imaging results that were available during my care of the patient were reviewed by me and considered in my medical decision making (see chart for details).    MDM Rules/Calculators/A&P                          MDM  MSE complete  [facility] was evaluated in Emergency Department on 03/30/2021 for the symptoms described in the history of present illness. He was evaluated in the context of the global COVID-19 pandemic, which necessitated consideration that the patient might be at risk for infection with the SARS-CoV-2 virus that causes COVID-19. Institutional protocols and algorithms that pertain to the evaluation of patients at risk for COVID-19 are in a state of rapid change based on information released by regulatory bodies  including the CDC and federal and state organizations. These policies and algorithms were followed during the patient's care in the ED.  Patient presented with complaint of generalized fatigue and weakness.  Patient with recent admission requiring intubation and pacemaker placement.  Patient confirmed with this provider and RN staff that he has a DNR/DNI. He desires comfort measures if significant illness found. He specifically declined aggressive measures such as CPR/Intubation.  Patient was noted to be hypothermic on arrival.  Code Sepsis initiated at time of initial evaluation.  Broad-spectrum antibiotics initiated.  Initial labs showed AKI with significant hyperkalemia.  Treatment of hyperkalemia initiated.  Shortly after initiation of hyperkalemia treatment patient was noted to  lose his pulse.  PEA arrest noted.  Patient became apneic quickly thereafter.  Time of death 1:30 PM.    Given patient's laboratory findings, I suspect that hyperkalemia led to the patient's cardiac arrest.  Hyperkalemia was in part  secondary to evident AKI.  Patient's son Derek Hinton is aware of case and case outcome.  The patient's son and his wife were educated bedside about the case and the likely cause of death.  All questions answered.  Patient's PCP - Dr. Guadalupe Maple [phone]) - is aware of case.  Death certificate is to be sent to Dr. Deborra Medina office for signature.  Patient's Cardiologist - Dr. Sallyanne Kuster - is also aware of case.      Final Clinical Impression(s) / ED Diagnoses Final diagnoses:  Cardiac arrest ([place])  Hyperkalemia  AKI (acute kidney injury) ([place])  Hypothermia, initial encounter    Rx / DC Orders ED Discharge Orders    None       Valarie Merino, MD 04/02/21 1432

## 2021-04-07 NOTE — ED Triage Notes (Signed)
Patient arrived by [facility] from home for waeakness, hypotension. Had pacemaker placed 1 week ago and states that he is weak as water since placement. Unable to obtain temp nor pulse ox at triage. Patient alert but requesting fluids

## 2021-04-07 NOTE — Progress Notes (Signed)
Chaplain Medinas-Lockley responding to initial visit for pt [facility] as pt has died. RN requested a visit, sharing that family was present. When chaplain arrived, family had already left. RN shared that the family had spoken with their pastor and "didn't need a chaplain."  Chaplain remains available for follow-up spiritual/emotional support as needed.   Ortencia Kick, MDiv     04-06-21 1400  Clinical Encounter Type  Visited With Patient not available  Visit Type Initial;Death;ED  Referral From Nurse

## 2021-04-07 NOTE — ED Notes (Addendum)
Pt placed on bear hugger per MD.

## 2021-04-07 NOTE — Sepsis Progress Note (Signed)
[facility] is monitoring this sepsis 

## 2021-04-07 NOTE — Progress Notes (Signed)
Pharmacy Antibiotic Note  [facility] is a 75 y.o. male admitted on March 22, 2021 with sepsis.  Pharmacy has been consulted for vancomycin and cefepime dosing.  Plan: Vancomycin 1250 mg IV x 1, then 750 mg IV q24h (eAUC 539, Goal AUC 400-550, SCr 1.72) Cefepime 2g IV every 12 hours Monitor renal function, Cx and clinical progression to narrow Vancomycin levels as needed     Temp (24hrs), Avg:95.7 F (35.4 C), Min:95.7 F (35.4 C), Max:95.7 F (35.4 C)  Recent Labs  Lab 03/05/21 0551 03/05/21 0627 03/06/21 0013 03/08/21 0025 03/08/21 1559 03/09/21 0051  WBC 7.5  --  9.0  --   --   --   CREATININE 1.70* 1.60* 1.56* 2.05* 1.68* 1.72*    Estimated Creatinine Clearance: 31.8 mL/min (A) (by C-G formula based on SCr of 1.72 mg/dL (H)).    Allergies  Allergen Reactions  . Penicillins Other (See Comments)    Pt unsure, states he "didn't feel good" Patient states it made him "feel Funny"     Bertis Ruddy, PharmD Clinical Pharmacist ED Pharmacist Phone # [phone] Mar 22, 2021 12:08 PM

## 2021-04-07 DEATH — deceased

## 2021-06-15 ENCOUNTER — Encounter: Payer: Medicare Other | Admitting: Internal Medicine

## 2021-06-19 ENCOUNTER — Encounter: Payer: Medicare Other | Admitting: Cardiovascular Disease

## 2022-04-07 IMAGING — DX DG CHEST 1V PORT
1 series · 1 of 1 positions shown · non-contrast
Comparison: 07/06/2020

CLINICAL DATA: Intubation

EXAM:
PORTABLE CHEST 1 VIEW

[chest ap]
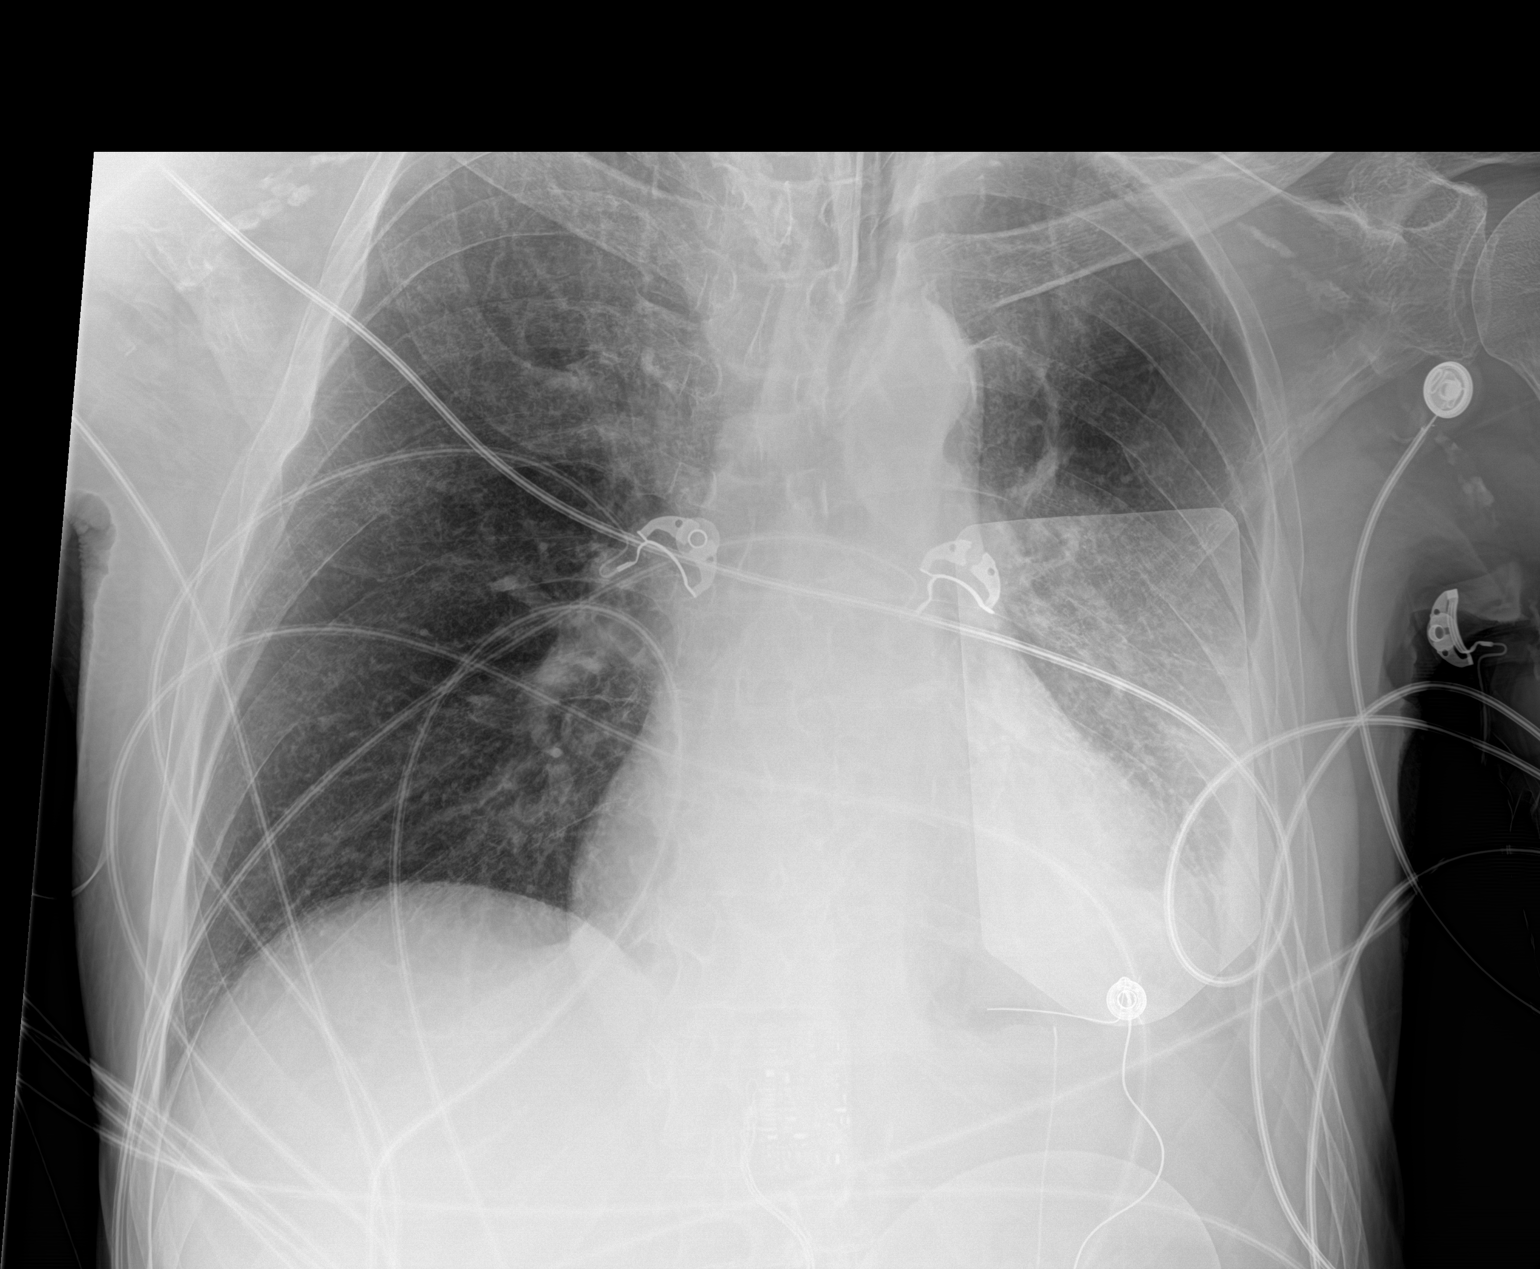

[1 of 1 positions shown; findings below may reference images not displayed]

FINDINGS: Cardiomegaly. Small left pleural effusion. Volume loss in the left
chest. Endotracheal tube with tip at the clavicular heads. Extensive
artifact from EKG leads. Remote right rib fracture or
IMPRESSION: 1. Unremarkable endotracheal tube.
2. Chronic left pleural effusion or pleural thickening with volume
loss. No acute finding when compared to 1916 comparison.
